# Patient Record
Sex: Female | Born: 1970 | Race: White | Hispanic: No | Marital: Married | Smoking: Never smoker
Health system: Southern US, Community
[De-identification: ages and names within clinical notes are randomized; demographics above are authoritative.]

## PROBLEM LIST (undated history)

## (undated) DIAGNOSIS — M7989 Other specified soft tissue disorders: Secondary | ICD-10-CM

## (undated) DIAGNOSIS — K760 Fatty (change of) liver, not elsewhere classified: Secondary | ICD-10-CM

## (undated) DIAGNOSIS — M329 Systemic lupus erythematosus, unspecified: Secondary | ICD-10-CM

## (undated) DIAGNOSIS — G90A Postural orthostatic tachycardia syndrome (POTS): Secondary | ICD-10-CM

## (undated) DIAGNOSIS — IMO0002 Reserved for concepts with insufficient information to code with codable children: Secondary | ICD-10-CM

## (undated) DIAGNOSIS — Z87442 Personal history of urinary calculi: Secondary | ICD-10-CM

## (undated) DIAGNOSIS — I498 Other specified cardiac arrhythmias: Secondary | ICD-10-CM

## (undated) DIAGNOSIS — D649 Anemia, unspecified: Secondary | ICD-10-CM

## (undated) DIAGNOSIS — M1990 Unspecified osteoarthritis, unspecified site: Secondary | ICD-10-CM

## (undated) DIAGNOSIS — F5002 Anorexia nervosa, binge eating/purging type: Secondary | ICD-10-CM

## (undated) DIAGNOSIS — J189 Pneumonia, unspecified organism: Secondary | ICD-10-CM

## (undated) DIAGNOSIS — M79631 Pain in right forearm: Secondary | ICD-10-CM

## (undated) DIAGNOSIS — K3184 Gastroparesis: Secondary | ICD-10-CM

## (undated) DIAGNOSIS — Z780 Asymptomatic menopausal state: Secondary | ICD-10-CM

## (undated) DIAGNOSIS — K219 Gastro-esophageal reflux disease without esophagitis: Secondary | ICD-10-CM

## (undated) DIAGNOSIS — T7840XA Allergy, unspecified, initial encounter: Secondary | ICD-10-CM

## (undated) DIAGNOSIS — N200 Calculus of kidney: Secondary | ICD-10-CM

## (undated) DIAGNOSIS — R7303 Prediabetes: Secondary | ICD-10-CM

## (undated) HISTORY — DX: Other specified soft tissue disorders: M79.89

## (undated) HISTORY — PX: CARPAL TUNNEL RELEASE: SHX101

## (undated) HISTORY — DX: Anorexia nervosa, binge eating/purging type: F50.02

## (undated) HISTORY — PX: CHOLECYSTECTOMY: SHX55

## (undated) HISTORY — DX: Asymptomatic menopausal state: Z78.0

## (undated) HISTORY — DX: Unspecified osteoarthritis, unspecified site: M19.90

## (undated) HISTORY — DX: Systemic lupus erythematosus, unspecified: M32.9

## (undated) HISTORY — DX: Allergy, unspecified, initial encounter: T78.40XA

## (undated) HISTORY — PX: ABDOMINAL HYSTERECTOMY: SHX81

## (undated) HISTORY — DX: Gastroparesis: K31.84

## (undated) HISTORY — DX: Anemia, unspecified: D64.9

## (undated) HISTORY — DX: Gastro-esophageal reflux disease without esophagitis: K21.9

## (undated) HISTORY — DX: Other specified soft tissue disorders: M79.631

## (undated) HISTORY — PX: KIDNEY STONE SURGERY: SHX686

---

## 2002-05-19 DIAGNOSIS — F50029 Anorexia nervosa, binge eating/purging type, unspecified: Secondary | ICD-10-CM

## 2002-05-19 DIAGNOSIS — F5002 Anorexia nervosa, binge eating/purging type: Secondary | ICD-10-CM

## 2002-05-19 HISTORY — DX: Anorexia nervosa, binge eating/purging type, unspecified: F50.029

## 2002-05-19 HISTORY — DX: Anorexia nervosa, binge eating/purging type: F50.02

## 2002-09-02 ENCOUNTER — Other Ambulatory Visit: Admission: RE | Admit: 2002-09-02 | Discharge: 2002-09-02 | Payer: Self-pay | Admitting: Obstetrics and Gynecology

## 2002-12-28 ENCOUNTER — Encounter: Admission: RE | Admit: 2002-12-28 | Discharge: 2003-03-28 | Payer: Self-pay

## 2003-10-23 ENCOUNTER — Other Ambulatory Visit: Admission: RE | Admit: 2003-10-23 | Discharge: 2003-10-23 | Payer: Self-pay | Admitting: Obstetrics and Gynecology

## 2005-02-18 ENCOUNTER — Other Ambulatory Visit: Admission: RE | Admit: 2005-02-18 | Discharge: 2005-02-18 | Payer: Self-pay | Admitting: Obstetrics and Gynecology

## 2005-05-19 HISTORY — PX: OVARIAN CYST SURGERY: SHX726

## 2005-08-27 ENCOUNTER — Ambulatory Visit (HOSPITAL_COMMUNITY): Admission: RE | Admit: 2005-08-27 | Discharge: 2005-08-27 | Payer: Self-pay | Admitting: Obstetrics and Gynecology

## 2005-11-17 ENCOUNTER — Encounter (INDEPENDENT_AMBULATORY_CARE_PROVIDER_SITE_OTHER): Payer: Self-pay | Admitting: Specialist

## 2005-11-18 ENCOUNTER — Inpatient Hospital Stay (HOSPITAL_COMMUNITY): Admission: RE | Admit: 2005-11-18 | Discharge: 2005-11-19 | Payer: Self-pay | Admitting: Ophthalmology

## 2007-09-08 ENCOUNTER — Encounter: Admission: RE | Admit: 2007-09-08 | Discharge: 2007-09-08 | Payer: Self-pay | Admitting: Gastroenterology

## 2007-09-22 ENCOUNTER — Encounter: Admission: RE | Admit: 2007-09-22 | Discharge: 2007-09-22 | Payer: Self-pay | Admitting: Gastroenterology

## 2008-04-24 ENCOUNTER — Encounter (INDEPENDENT_AMBULATORY_CARE_PROVIDER_SITE_OTHER): Payer: Self-pay | Admitting: Obstetrics and Gynecology

## 2008-04-24 ENCOUNTER — Ambulatory Visit (HOSPITAL_COMMUNITY): Admission: RE | Admit: 2008-04-24 | Discharge: 2008-04-25 | Payer: Self-pay | Admitting: Obstetrics and Gynecology

## 2008-08-28 ENCOUNTER — Encounter: Admission: RE | Admit: 2008-08-28 | Discharge: 2008-08-28 | Payer: Self-pay | Admitting: Obstetrics and Gynecology

## 2010-06-11 ENCOUNTER — Emergency Department (HOSPITAL_COMMUNITY)
Admission: EM | Admit: 2010-06-11 | Discharge: 2010-06-11 | Payer: Self-pay | Source: Home / Self Care | Admitting: Emergency Medicine

## 2010-06-12 LAB — URINE MICROSCOPIC-ADD ON

## 2010-06-12 LAB — DIFFERENTIAL
Basophils Absolute: 0 10*3/uL (ref 0.0–0.1)
Eosinophils Absolute: 0.1 10*3/uL (ref 0.0–0.7)
Eosinophils Relative: 1 % (ref 0–5)
Lymphocytes Relative: 23 % (ref 12–46)
Lymphs Abs: 1.6 10*3/uL (ref 0.7–4.0)
Neutrophils Relative %: 71 % (ref 43–77)

## 2010-06-12 LAB — URINALYSIS, ROUTINE W REFLEX MICROSCOPIC
Leukocytes, UA: NEGATIVE
Nitrite: NEGATIVE
Specific Gravity, Urine: 1.018 (ref 1.005–1.030)
Urine Glucose, Fasting: NEGATIVE mg/dL
pH: 7.5 (ref 5.0–8.0)

## 2010-06-12 LAB — BASIC METABOLIC PANEL
Chloride: 106 mEq/L (ref 96–112)
GFR calc non Af Amer: >60 mL/min (ref >60)
Potassium: 3.9 mEq/L (ref 3.5–5.1)
Sodium: 140 mEq/L (ref 135–145)

## 2010-06-12 LAB — CBC
HCT: 34.9 % — ABNORMAL LOW (ref 36.0–46.0)
Platelets: 255 10*3/uL (ref 150–400)
RBC: 4.28 MIL/uL (ref 3.87–5.11)
RDW: 12.3 % (ref 11.5–15.5)
WBC: 6.8 10*3/uL (ref 4.0–10.5)

## 2010-08-01 ENCOUNTER — Emergency Department (HOSPITAL_COMMUNITY): Payer: PRIVATE HEALTH INSURANCE

## 2010-08-01 ENCOUNTER — Emergency Department (HOSPITAL_COMMUNITY)
Admission: EM | Admit: 2010-08-01 | Discharge: 2010-08-01 | Disposition: A | Discharge status: Home / Self Care | Payer: PRIVATE HEALTH INSURANCE | Attending: Emergency Medicine | Admitting: Emergency Medicine

## 2010-08-01 DIAGNOSIS — R0602 Shortness of breath: Secondary | ICD-10-CM | POA: Insufficient documentation

## 2010-08-01 DIAGNOSIS — Z87442 Personal history of urinary calculi: Secondary | ICD-10-CM | POA: Insufficient documentation

## 2010-08-01 DIAGNOSIS — F410 Panic disorder [episodic paroxysmal anxiety] without agoraphobia: Secondary | ICD-10-CM | POA: Insufficient documentation

## 2010-08-01 DIAGNOSIS — R110 Nausea: Secondary | ICD-10-CM | POA: Insufficient documentation

## 2010-08-01 DIAGNOSIS — R079 Chest pain, unspecified: Secondary | ICD-10-CM | POA: Insufficient documentation

## 2010-08-01 DIAGNOSIS — M329 Systemic lupus erythematosus, unspecified: Secondary | ICD-10-CM | POA: Insufficient documentation

## 2010-08-01 LAB — DIFFERENTIAL
Basophils Relative: 0 % (ref 0–1)
Monocytes Absolute: 0.8 10*3/uL (ref 0.1–1.0)
Monocytes Relative: 9 % (ref 3–12)
Neutro Abs: 6.5 10*3/uL (ref 1.7–7.7)

## 2010-08-01 LAB — URINALYSIS, ROUTINE W REFLEX MICROSCOPIC
Bilirubin Urine: NEGATIVE
Hgb urine dipstick: NEGATIVE
Specific Gravity, Urine: 1.007 (ref 1.005–1.030)
Urobilinogen, UA: 0.2 mg/dL (ref 0.0–1.0)

## 2010-08-01 LAB — POCT I-STAT, CHEM 8
Calcium, Ion: 1.02 mmol/L — ABNORMAL LOW (ref 1.12–1.32)
Creatinine, Ser: 0.8 mg/dL (ref 0.4–1.2)
Glucose, Bld: 112 mg/dL — ABNORMAL HIGH (ref 70–99)
Hemoglobin: 12.9 g/dL (ref 12.0–15.0)
Potassium: 3.1 mEq/L — ABNORMAL LOW (ref 3.5–5.1)
TCO2: 18 mmol/L (ref 0–100)

## 2010-08-01 LAB — TROPONIN I: Troponin I: 0.01 ng/mL (ref 0.00–0.06)

## 2010-08-01 LAB — CBC
HCT: 36.8 % (ref 36.0–46.0)
Hemoglobin: 13 g/dL (ref 12.0–15.0)
MCH: 29.3 pg (ref 26.0–34.0)
MCHC: 35.3 g/dL (ref 30.0–36.0)

## 2010-08-01 LAB — D-DIMER, QUANTITATIVE: D-Dimer, Quant: 0.34 ug/mL-FEU (ref 0.00–0.48)

## 2010-08-05 ENCOUNTER — Emergency Department (HOSPITAL_COMMUNITY)
Admission: EM | Admit: 2010-08-05 | Discharge: 2010-08-05 | Disposition: A | Discharge status: Home / Self Care | Payer: PRIVATE HEALTH INSURANCE | Attending: Emergency Medicine | Admitting: Emergency Medicine

## 2010-08-05 ENCOUNTER — Emergency Department (HOSPITAL_COMMUNITY): Payer: PRIVATE HEALTH INSURANCE

## 2010-08-05 DIAGNOSIS — R209 Unspecified disturbances of skin sensation: Secondary | ICD-10-CM | POA: Insufficient documentation

## 2010-08-05 DIAGNOSIS — R059 Cough, unspecified: Secondary | ICD-10-CM | POA: Insufficient documentation

## 2010-08-05 DIAGNOSIS — R42 Dizziness and giddiness: Secondary | ICD-10-CM | POA: Insufficient documentation

## 2010-08-05 DIAGNOSIS — R109 Unspecified abdominal pain: Secondary | ICD-10-CM | POA: Insufficient documentation

## 2010-08-05 DIAGNOSIS — R079 Chest pain, unspecified: Secondary | ICD-10-CM | POA: Insufficient documentation

## 2010-08-05 DIAGNOSIS — Z79899 Other long term (current) drug therapy: Secondary | ICD-10-CM | POA: Insufficient documentation

## 2010-08-05 DIAGNOSIS — R05 Cough: Secondary | ICD-10-CM | POA: Insufficient documentation

## 2010-08-05 DIAGNOSIS — R4782 Fluency disorder in conditions classified elsewhere: Secondary | ICD-10-CM | POA: Insufficient documentation

## 2010-08-05 DIAGNOSIS — M6281 Muscle weakness (generalized): Secondary | ICD-10-CM | POA: Insufficient documentation

## 2010-08-05 DIAGNOSIS — M329 Systemic lupus erythematosus, unspecified: Secondary | ICD-10-CM | POA: Insufficient documentation

## 2010-08-05 DIAGNOSIS — R0602 Shortness of breath: Secondary | ICD-10-CM | POA: Insufficient documentation

## 2010-08-05 DIAGNOSIS — F411 Generalized anxiety disorder: Secondary | ICD-10-CM | POA: Insufficient documentation

## 2010-08-05 DIAGNOSIS — K8020 Calculus of gallbladder without cholecystitis without obstruction: Secondary | ICD-10-CM | POA: Insufficient documentation

## 2010-08-05 DIAGNOSIS — M549 Dorsalgia, unspecified: Secondary | ICD-10-CM | POA: Insufficient documentation

## 2010-08-05 LAB — COMPREHENSIVE METABOLIC PANEL
Alkaline Phosphatase: 135 U/L — ABNORMAL HIGH (ref 39–117)
BUN: 10 mg/dL (ref 6–23)
Chloride: 109 mEq/L (ref 96–112)
Glucose, Bld: 87 mg/dL (ref 70–99)
Potassium: 4 mEq/L (ref 3.5–5.1)
Total Bilirubin: 0.3 mg/dL (ref 0.3–1.2)

## 2010-08-05 LAB — CBC
HCT: 36.3 % (ref 36.0–46.0)
MCV: 81 fL (ref 78.0–100.0)
RBC: 4.48 MIL/uL (ref 3.87–5.11)
WBC: 8.7 10*3/uL (ref 4.0–10.5)

## 2010-08-05 LAB — LIPASE, BLOOD: Lipase: 29 U/L (ref 11–59)

## 2010-08-05 LAB — DIFFERENTIAL
Lymphocytes Relative: 26 % (ref 12–46)
Lymphs Abs: 2.2 10*3/uL (ref 0.7–4.0)
Neutrophils Relative %: 67 % (ref 43–77)

## 2010-10-01 NOTE — H&P (Signed)
NAMECAROLYNN, Debbie [name]            ACCOUNT NO.:  [id number]   MEDICAL RECORD NO.:  [id number]          PATIENT TYPE:  AMB   LOCATION:  SDC                           FACILITY:  [facility]   PHYSICIAN:  Duke Salvia. Marcelle Overlie, M.D.DATE OF BIRTH:  06-24-1970   DATE OF ADMISSION:  DATE OF DISCHARGE:                              HISTORY & PHYSICAL   CHIEF COMPLAINT:  Dyspareunia, abnormal uterine bleeding.   HISTORY OF PRESENT ILLNESS:  A 40 year old, G2, P2.  This patient  underwent first laparoscopy in 2007 for pelvic pain and dyspareunia.  Findings were pretty normal at that time, but due to continued problems,  she underwent [facility], December 05, 2005.  Since that time, she has had some  minimal monthly spotting, but this has been in aggravation for her.  In  addition, she is still having deep dyspareunia from point tenderness  from cervical manipulation and presents now for removal of the cervical  stump.  This procedure including risks related to bleeding, infection,  the possible need for open additional surgery, adjacent organ injury,  along with her expected recovery time, all reviewed with her, which she  understands and accepts.   PAST MEDICAL HISTORY:   ALLERGIES:  Seasonal allergies only.   SURGICAL HISTORY:  She has had diagnostic laparoscopy, LSH in 2007.  She  is a G2, P2.   CURRENT MEDICATIONS:  GlycoLax.   PHYSICAL EXAMINATION:  VITAL SIGNS:  Temperature 98.2 and blood pressure  100/70.  HEENT: Unremarkable.  NECK:  Supple.  Without masses.  LUNGS:  Clear.  CARDIOVASCULAR:  Regular rate and rhythm without murmurs, rubs, or  gallops.  BREASTS:  Without masses.  ABDOMEN:  Soft and nontender.  PELVIC:  Normal external genitalia.  Vagina and cervix clear.  Uterine  fundus is surgically absent.  Adnexa negative.  EXTREMITIES:  Unremarkable.  NEUROLOGIC:  Unremarkable.   IMPRESSION:  Dyspareunia, continued bleeding, status post laparoscopic  supracervical hysterectomy.   PLAN:   Diagnostic laparoscopy with removal of cervical stump.  Procedure  and risks reviewed as above.      Richard M. Marcelle Overlie, M.D.  Electronically Signed     RMH/MEDQ  D:  04/19/2008  T:  04/20/2008  Job:  [id number]

## 2010-10-01 NOTE — Op Note (Signed)
Debbie [name], Debbie [name]            ACCOUNT NO.:  [id number]   MEDICAL RECORD NO.:  [id number]          PATIENT TYPE:  OIB   LOCATION:  9309                          [facility]ILITY:  [facility]   PHYSICIAN:  Duke Salvia. Marcelle Overlie, M.D.DATE OF BIRTH:  Jan 15, 1971   DATE OF PROCEDURE:  04/24/2008  DATE OF DISCHARGE:                               OPERATIVE REPORT   PREOPERATIVE DIAGNOSES:  1. Abnormal uterine bleeding.  2. Dyspareunia.   POSTOPERATIVE DIAGNOSES:  1. Abnormal uterine bleeding.  2. Dyspareunia.   PROCEDURE:  Diagnostic laparoscopy, removal of cervical stump.   SURGEON:  Duke Salvia. Marcelle Overlie, MD   ANESTHESIA:  General endotracheal.   COMPLICATIONS:  None.   DRAINS:  Foley catheter.   BLOOD LOSS:  50-100 mL.   SPECIMENS REMOVED:  Cervical stump.   PROCEDURE AND FINDINGS:  The patient was taken to the operative room  after an adequate level of general endotracheal anesthesia was obtained  with the patient's legs in stirrups.  The abdomen, perineum, and vagina  were prepped and draped in usual manner for laparoscopy.  The bladder  was drained.  Subumbilical area was infiltrated with 0.25% Marcaine  plain.  A small incision was made and the Veress needle was introduced  without difficulty.  It was intra-abdominal position verified by  pressure and water testing.  After 2-1/2 L pneumoperitoneum was then  created, laparoscopic trocar and sleeve were then introduced without  difficulty.  There was no evidence of any bleeding or trauma.  She was  placed in slight Trendelenburg and a 5-mm trocar was placed, 3  fingerbreadths above the symphysis in the midline under direct  visualization.  With the patient in Trendelenburg, the pelvis was  inspected, bilateral tubes and ovaries were normal.  There were no  unusual adhesions into the area of the cervical stump.  The cul-de-sac  area looked free and clear.  After this was discovered, the left vaginal  portion of the procedure started, the  legs were extended, a weighted  speculum was positioned, cervix grasped with tenaculum, cervical vaginal  mucosa was incised, posterior colpotomy performed without difficulty,  and bladder was advanced superiorly with sharp and blunt dissection.  Uterosacral ligaments were clamped, divided, and suture-ligated with 0  Vicryl suture.  In sequential manner site, closed the uterus and  assuring that the bladder was well above the cardinal ligament, it was  clamped, divided, and suture ligated.  The surgeon's finger was then  used to positioned posteriorly to identify the anterior peritoneal  reflection, which was dissected free and the bladder was again advanced  superiorly.  The remaining pedicle was clamped, divided, suture ligated,  and the cervical stump specimen was removed.  Prior to closure, all  sponge, needle, and instruments counts were reported as correct x2.  The  vagina closed from right-to-left with interrupted 2-0 Monocryl sutures.  This area was hemostatic.  Foley catheter positioned and draining clear  urine.  Repeat laparoscopy then revealed that the operative site was  hemostatic.  There were no adhesions into the vaginal cuff area.  After  this was noted, instruments were removed and  gas allowed to escape.  Defects closed with 4 Dexon subcuticular sutures and Dermabond.  She  tolerated this well and went to recovery room in good condition.      Richard M. Marcelle Overlie, M.D.  Electronically Signed     RMH/MEDQ  D:  04/24/2008  T:  04/24/2008  Job:  [id number]

## 2010-10-01 NOTE — Discharge Summary (Signed)
Debbie [name], Debbie [name]            ACCOUNT NO.:  [id number]   MEDICAL RECORD NO.:  [id number]          PATIENT TYPE:  OIB   LOCATION:  9309                          [facility]ACILITY:  [facility]   PHYSICIAN:  Duke Salvia. Marcelle Overlie, M.D.DATE OF BIRTH:  04-13-1971   DATE OF ADMISSION:  04/24/2008  DATE OF DISCHARGE:  04/25/2008                               DISCHARGE SUMMARY   DISCHARGE DIAGNOSES:  1. Dyspareunia.  2. Abnormal uterine bleeding.   PROCEDURE:  Laparoscopy with removal of cervical stump this admission.   SUMMARY OF HISTORY AND PHYSICAL EXAM:  Please see admission H&P for  details.  Briefly, a 40 year old G2 P2, who underwent LSH in July 2007,  did well initially, but continues to have some monthly light bleeding,  also associated with dyspareunia.  This has not improved and she  presents at this time for removal of the cervical stump.   HOSPITAL COURSE:  On April 24, 2008, under general anesthesia, the  patient underwent laparoscopy followed by a vaginal removal of the  cervical stump with minimal EBL.  On the first postop day, her catheter  was removed.  She was tolerating a regular diet.  She was afebrile.  Hemoglobin preop 12.1.  Postop on April 25, 2008 was 10.1.  WBC was  8.3 postop.  She was ambulating without difficulty and was ready for  discharge on postop day #1.   DISPOSITION:  Discharged on Vicodin 1-2 p.o. q.4-6 h. p.r.n. pain,  Motrin 800 mg 1 p.o. q.8-12 h. p.r.n. pain.  She will return to our  office in a week to 10 days.  Advised to report any increased vaginal  bleeding, fever over 101, incisional redness or drainage.  She was given  specific instructions regarding diet/exercise.   CONDITION:  Good.   ACTIVITY:  Good.      Richard M. Marcelle Overlie, M.D.  Electronically Signed     RMH/MEDQ  D:  04/25/2008  T:  04/25/2008  Job:  [id number]

## 2010-10-04 NOTE — H&P (Signed)
Debbie [name], Debbie [name]            ACCOUNT NO.:  [id number]   MEDICAL RECORD NO.:  [id number]          PATIENT TYPE:  AMB   LOCATION:  SDC                           [facility]   PHYSICIAN:  Duke Salvia. Marcelle Overlie, M.D.DATE OF BIRTH:  [birth year]   DATE OF ADMISSION:  DATE OF DISCHARGE:                                HISTORY & PHYSICAL   CHIEF COMPLAINT:  Pelvic pain.   HISTORY OF PRESENT ILLNESS:  A 40 year old G2, P2 currently using Ortho-  Evra, had worsening lower back and pelvic pain and dyspareunia and presents  now for definitive laparoscopy.  This procedure, including risks of  bleeding, infection, adjacent organ injury, the possible need for open or  additional surgery were all reviewed with her.  She understands and accepts.   ALLERGIES:  NO KNOWN DRUG ALLERGIES.   CURRENT MEDICATIONS:  Ortho-Evra and pain medicine p.r.n.   PAST SURGICAL HISTORY:  Prior laparoscopy at [facility] for an  ovarian cyst.   OBSTETRICAL HISTORY:  Two vaginal deliveries at term without complications.   REVIEW OF SYSTEMS:  Significant for IBS symptoms and history of migraine  headache.   FAMILY HISTORY:  Significant for diabetes, cancer, high blood pressure and  thyroid disease.   PHYSICAL EXAMINATION:  VITAL SIGNS:  Temperature 98.2, blood pressure  120/70.  HEENT:  Unremarkable.  NECK:  Supple without masses.  LUNGS:  Clear.  CARDIOVASCULAR:  Regular rate and rhythm without murmurs, rubs, or gallops.  BREASTS:  Without masses.  ABDOMEN:  Soft, flat and nontender.  PELVIC:  Normal external genitalia. Vagina and cervix are normal Uterus  normal position and size.  Adnexa negative.  No unusual nodularity or  tenderness.  EXTREMITIES:  Unremarkable.  NEUROLOGIC:  Unremarkable.   IMPRESSION:  Chronic pelvic pain, dyspareunia.   PLAN:  Diagnostic laparoscopy.  Procedure and risks reviewed as above.      Richard M. Marcelle Overlie, M.D.  Electronically Signed     RMH/MEDQ  D:   08/25/2005  T:  08/25/2005  Job:  [id number]

## 2010-10-04 NOTE — H&P (Signed)
Debbie [name], Debbie [name]            ACCOUNT NO.:  [id number]   MEDICAL RECORD NO.:  [id number]          PATIENT TYPE:  AMB   LOCATION:  SDC                           [facility]   PHYSICIAN:  Duke Salvia. Marcelle Overlie, M.D.DATE OF BIRTH:  [birth year]   DATE OF ADMISSION:  11/17/2005  DATE OF DISCHARGE:                                HISTORY & PHYSICAL   CHIEF COMPLAINT:  Pelvic pain.   HISTORY OF PRESENT ILLNESS:  A 40 year old G5, P2 who has had a one-year  history of pelvic pain and increased pressure.  Part of her evaluation  recently she underwent diagnostic laparoscopic that showed normal findings,  that was dated August 27, 2005, except for some increased vessels suspicious  for possible pelvic congestion syndrome.  She has tried hormonal suppression  with Ortho-Evra.  Additionally, she has had GI evaluation by Dr. Randa Evens  with recent colonoscopy, all of which was normal.  Most of her symptoms  clustered around the time of her with increased pain and pelvic pressure at  that time.  She presents now for [facility].  She understands that with [facility] there  is a small chance of monthly spotting.  Other risks relative to bleeding,  infection, transfusion, the possible need to complete the surgery by open  technique, adjacent organ injury, the need for yearly cytology all reviewed  with her which she understands and accepts.  Her preference was for a  minimally invasive procedure.   PAST MEDICAL HISTORY:   CURRENT MEDICATIONS:  Wellbutrin.   OTHER SURGERIES:  She has had laparoscopy for a prior ovarian cyst.   OBSTETRICAL HISTORY:  She has had two vaginal deliveries at term.   FAMILY HISTORY:  Otherwise noncontributory.   PHYSICAL EXAMINATION:  VITAL SIGNS:  Temperature 98.2, blood pressure  120/62.  HEENT:  Unremarkable.  NECK:  Supple without masses.  LUNGS:  Clear.  CARDIOVASCULAR:  Regular rate and rhythm without murmurs, rubs, or gallops  noted.  BREASTS:  Without masses.  ABDOMEN:   Soft, flat, nontender.  PELVIC:  Normal external genitalia.  Vagina, cervix clear.  Uterus mid  position, normal size.  Adnexa negative, mobile.  EXTREMITIES:  Unremarkable.  NEUROLOGIC:  Unremarkable.   IMPRESSION:  Chronic perimenstrual pain unresponsive to conservative  measures.   PLAN:  LSH.  Procedure and risks reviewed as above.      Richard M. Marcelle Overlie, M.D.  Electronically Signed     RMH/MEDQ  D:  11/14/2005  T:  11/14/2005  Job:  [id number]

## 2010-10-04 NOTE — Op Note (Signed)
Debbie [name], NOLDEN            ACCOUNT NO.:  [id number]   MEDICAL RECORD NO.:  [id number]          PATIENT TYPE:  AMB   LOCATION:  SDC                           [facility]   PHYSICIAN:  Duke Salvia. Marcelle Overlie, M.D.DATE OF BIRTH:  February 10, 1971   DATE OF PROCEDURE:  08/27/2005  DATE OF DISCHARGE:                                 OPERATIVE REPORT   PREOP DIAGNOSIS:  Pelvic pain, dyspareunia.   POSTOP DIAGNOSIS:  Pelvic pain, dyspareunia.   PROCEDURE:  Diagnostic laparoscopy.   SURGEON:  Duke Salvia. Marcelle Overlie, M.D.   ANESTHESIA:  General endotracheal.   SPECIMENS:  None.   COMPLICATIONS:  None.   ESTIMATED BLOOD LOSS:  Minimal.   PROCEDURE AND FINDINGS:  The patient was taken to the operating room and  after an adequate level of general endotracheal anesthesia was obtained with  the patient with legs in stirrups, the abdomen, perineum and vagina prepped  and draped usual manner for laparoscopy.  The bladder was drained, EUA  carried out.  Uterus anterior, normal size.  Adnexa negative.  A Hulka  tenaculum positioned.  In-and-out Foley catheter to empty the bladder.  Marcaine used in the subumbilical area, small incision was made.  The Veress  needle was introduced without difficulty.  Its intra-abdominal position was  verified by pressure and water testing.  A 2.5 L pneumoperitoneum was then  created.  Laparoscopic trocar and sleeve were then introduced without  difficulty.  Three fingerbreadths above the symphysis in midline, 5 mm  trocar was inserted under direct visualization.  The patient in  Trendelenburg and uterus anteflexed, pelvic findings as follows.   Anterior posterior cul-de-sac spaces were unremarkable.  Both uterosacral  ligaments were well defined.  Uterus itself was normal as were bilateral  tubes and ovaries.  The sidewall course of the ureter completely normal.  No  evidence of adhesive disease or early stage endometriosis.  There were some  minimally  prominent vessels consistent with mild pelvic congestion.  The  appendix, cecum, liver, gallbladder, upper abdominal findings unremarkable.  After these findings were noted and video documented, instruments removed,  gas allowed to escape.  Defect was closed with 4-0 Dexon subcuticular  sutures and Dermabond.  She tolerated this well went to recovery room in  good condition.      Richard M. Marcelle Overlie, M.D.  Electronically Signed     RMH/MEDQ  D:  08/27/2005  T:  08/27/2005  Job:  [id number]

## 2010-10-04 NOTE — Discharge Summary (Signed)
NAMETRUST, CRAGO            ACCOUNT NO.:  [id number]   MEDICAL RECORD NO.:  [id number]          PATIENT TYPE:  INP   LOCATION:  9309                          [facility]   PHYSICIAN:  Duke Salvia. Marcelle Overlie, M.D.DATE OF BIRTH:  1970-08-04   DATE OF ADMISSION:  11/17/2005  DATE OF DISCHARGE:  11/19/2005                                 DISCHARGE SUMMARY   DISCHARGE DIAGNOSES:  1.  Chronic pelvic pain.  2.  LSH this admission.   SUMMARY OF HISTORY AND PHYSICAL EXAMINATION:  Please see admission H&P for  details.  Briefly, a 40 year old with chronic pelvic pain who presents for  an [facility].  She has failed conservative treatment as noted in her admission  history and physical details.   HOSPITAL COURSE:  On July 2 under general anesthesia, the patient underwent  LSA with an EBL of 150 mL.  Her catheter was removed the evening of surgery  and she was voiding without difficulty.  The following a.m. however,  although she was afebrile and her exam was within normal limits, she was  still complaining of significant pain and felt she was not ready for  discharge at that point.  At that point hemoglobin was 8.9, WBC 7900.  She  was afebrile.  Her diet was advanced that day.  She was admitted for one  additional night.  On the a.m. of July 4 was afebrile, hemoglobin 10.6.  her  abdominal exam was unremarkable.  She was ready for discharge at that point.   CBC on admission:  WBC 6.0, hemoglobin 12.1.  UPT was negative.  Blood type  is O+, antibody screen was negative.  On the first postop day, hemoglobin  8.9.  CBC and November 19, 2005:  WBC 7100, hemoglobin 10.6.   DISPOSITION:  The patient discharged on Hemocyte once daily, Tylox p.r.n.  pain.  Will return the office in 2 days for follow-up visit.  Advised to  report any incisional redness or drainage, increased pain or bleeding, or  fever over 101, was given specific instructions regarding diet, sex, and  exercise.   CONDITION:  Good.   ACTIVITY:  Graded increase.      Richard M. Marcelle Overlie, M.D.  Electronically Signed     RMH/MEDQ  D:  11/19/2005  T:  11/19/2005  Job:  [id number]

## 2010-10-04 NOTE — H&P (Signed)
Debbie [name], Debbie [name]            ACCOUNT NO.:  [id number]   MEDICAL RECORD NO.:  [id number]          PATIENT TYPE:  AMB   LOCATION:  SDC                           [facility]   PHYSICIAN:  Duke Salvia. Marcelle Overlie, M.D.DATE OF BIRTH:  05-Feb-1971   DATE OF ADMISSION:  DATE OF DISCHARGE:                                HISTORY & PHYSICAL   CHIEF COMPLAINT:  Pelvic pain, dyspareunia.   HISTORY OF PRESENT ILLNESS:  A 40 year old G5, P2, currently on Loestrin and  also using condoms, was seen earlier this year, complaining of continued  problems with abnormal bleeding and pelvic pain, along with dyspareunia.  We  had discussed hysterectomy at that point, which she was strongly  considering.  Due to continued problems with pain and bleeding, she presents  now for more conservative approach with laparoscopy first.  This procedure,  including risks of bleeding, infection, other complications that may require  open or additional surgery, all reviewed with her, which she understands and  accepts.   ALLERGIES:  None.   CURRENT MEDICATIONS:  1.  Loestrin.  2.  Protonix.  3.  Prozac.   Dictator asks to cancel this dictation.      Richard M. Marcelle Overlie, M.D.  Electronically Signed     RMH/MEDQ  D:  08/11/2005  T:  08/11/2005  Job:  [id number]

## 2010-10-04 NOTE — Op Note (Signed)
Debbie [name], BRIGANDI            ACCOUNT NO.:  [id number]   MEDICAL RECORD NO.:  [id number]          PATIENT TYPE:  AMB   LOCATION:  SDC                           [facility]   PHYSICIAN:  Duke Salvia. Marcelle Overlie, M.D.DATE OF BIRTH:  Nov 12, 1970   DATE OF PROCEDURE:  11/17/2005  DATE OF DISCHARGE:                                 OPERATIVE REPORT   PREOPERATIVE DIAGNOSIS:  Chronic pelvic pain.   POSTOPERATIVE DIAGNOSIS:  Chronic pelvic pain.   PROCEDURE:  LFA.   SURGEON:  Duke Salvia. Marcelle Overlie, M.D.   ASSISTANT:  Guy Sandifer. Henderson Cloud, M.D.   ANESTHESIA:  General endotracheal.   COMPLICATIONS:  None.   DRAINS:  Foley catheter.   BLOOD LOSS:  150 mL.   PROCEDURE AND FINDINGS:  The patient was taken to the operating room.  After  an adequate level of general endotracheal anesthesia was obtained, the  patient legs stirrups, the abdomen, perineum and vagina prepped and draped  usual manner for laparoscopy.  The bladder was drained, EUA carried out.  Uterus midposition, normal size, adnexa negative.  The subumbilical area was  infiltrated with half percent Marcaine plain.  Small incision was made.  Veress needle was introduced without difficulty, its intra-abdominal  position verified by pressure and water testing.  After a 2.5 liter  pneumoperitoneum created, a laparoscopic trocar and sleeve was then  introduced without difficulty.  There is no evidence of any bleeding or  trauma and the right and left lower quadrant 10 mm bladeless trocar was  inserted after negative transillumination.  The patient placed in  Trendelenburg and the assistant used to grasp the uterine fundus which was  upper limits normal size.  Bilateral adnexa were unremarkable.  Starting on  the left the Harmonic Ace was used to coagulate and divide the utero-ovarian  pedicle down to and including the round ligament with excellent hemostasis.  This was repeated on the opposite side.  The peritoneum was then carried  around to the midline anteriorly and the bladder was minimally dissected  with blunt dissection.  The uterine artery vasculature was then skeletonized  on each side with Harmonic Ace.  The ascending branch of the uterine was  then coagulated and cut on each side with excellent hemostasis.  The  Harmonic was then used to transect the cervix in a minimal reverse cone  fashion.  Once this was accomplished, the Harmonic Ace on low power was used  to coagulate the endocervical canal.  The morcellator was then introduced to  left lower quadrant and the uterine fundus was morcellated, the pelvis was  irrigated with saline.  Pressure was reduced.  The operative site was noted  to be hemostatic.  All uterine fragments had been removed.  After further  irrigation and aspiration, the operative site was observed and noted be  hemostatic.  Interceed was placed across the cervical stump.  All  instruments were removed.  Gas allowed to escape.  Deep layers closed with 4-0 Dexon subcuticular sutures and Dermabond.  The  left lower quadrant incision fascia was also closed individually with 2-0  Vicryl suture.  Clear  urine noted at the end of the case.  She tolerated  this well, went to recovery room in good condition.      Richard M. Marcelle Overlie, M.D.  Electronically Signed     RMH/MEDQ  D:  11/17/2005  T:  11/17/2005  Job:  [id number]

## 2011-02-21 LAB — CBC
HCT: 29 % — ABNORMAL LOW (ref 36.0–46.0)
HCT: 34.4 % — ABNORMAL LOW (ref 36.0–46.0)
Hemoglobin: 12.1 g/dL (ref 12.0–15.0)
MCHC: 35 g/dL (ref 30.0–36.0)
MCHC: 35.1 g/dL (ref 30.0–36.0)
MCV: 88 fL (ref 78.0–100.0)
RBC: 3.29 MIL/uL — ABNORMAL LOW (ref 3.87–5.11)
RBC: 3.96 MIL/uL (ref 3.87–5.11)
RDW: 12.8 % (ref 11.5–15.5)
WBC: 8.3 10*3/uL (ref 4.0–10.5)

## 2011-07-25 ENCOUNTER — Other Ambulatory Visit (HOSPITAL_COMMUNITY): Payer: Self-pay | Admitting: Gastroenterology

## 2011-07-25 DIAGNOSIS — R112 Nausea with vomiting, unspecified: Secondary | ICD-10-CM

## 2011-08-18 ENCOUNTER — Emergency Department (HOSPITAL_COMMUNITY): Payer: BC Managed Care – PPO

## 2011-08-18 ENCOUNTER — Encounter (HOSPITAL_COMMUNITY): Payer: Self-pay | Admitting: Emergency Medicine

## 2011-08-18 ENCOUNTER — Emergency Department (HOSPITAL_COMMUNITY)
Admission: EM | Admit: 2011-08-18 | Discharge: 2011-08-19 | Disposition: A | Discharge status: Home / Self Care | Payer: BC Managed Care – PPO | Attending: Emergency Medicine | Admitting: Emergency Medicine

## 2011-08-18 DIAGNOSIS — Z79899 Other long term (current) drug therapy: Secondary | ICD-10-CM | POA: Insufficient documentation

## 2011-08-18 DIAGNOSIS — M329 Systemic lupus erythematosus, unspecified: Secondary | ICD-10-CM | POA: Insufficient documentation

## 2011-08-18 DIAGNOSIS — K5900 Constipation, unspecified: Secondary | ICD-10-CM | POA: Insufficient documentation

## 2011-08-18 DIAGNOSIS — R112 Nausea with vomiting, unspecified: Secondary | ICD-10-CM | POA: Insufficient documentation

## 2011-08-18 DIAGNOSIS — R109 Unspecified abdominal pain: Secondary | ICD-10-CM | POA: Insufficient documentation

## 2011-08-18 HISTORY — DX: Systemic lupus erythematosus, unspecified: M32.9

## 2011-08-18 HISTORY — DX: Calculus of kidney: N20.0

## 2011-08-18 HISTORY — DX: Reserved for concepts with insufficient information to code with codable children: IMO0002

## 2011-08-18 LAB — BASIC METABOLIC PANEL
BUN: 14 mg/dL (ref 6–23)
Calcium: 10.4 mg/dL (ref 8.4–10.5)
Chloride: 98 mEq/L (ref 96–112)
Creatinine, Ser: 0.54 mg/dL (ref 0.50–1.10)
GFR calc Af Amer: >90 mL/min (ref >90)
GFR calc non Af Amer: >90 mL/min (ref >90)

## 2011-08-18 LAB — CBC
HCT: 39.9 % (ref 36.0–46.0)
Hemoglobin: 14.1 g/dL (ref 12.0–15.0)
MCH: 29.4 pg (ref 26.0–34.0)
MCHC: 35.3 g/dL (ref 30.0–36.0)
MCV: 83.1 fL (ref 78.0–100.0)
RDW: 12.5 % (ref 11.5–15.5)

## 2011-08-18 LAB — DIFFERENTIAL
Basophils Absolute: 0 10*3/uL (ref 0.0–0.1)
Basophils Relative: 0 % (ref 0–1)
Eosinophils Absolute: 0.1 10*3/uL (ref 0.0–0.7)
Eosinophils Relative: 1 % (ref 0–5)
Monocytes Absolute: 0.5 10*3/uL (ref 0.1–1.0)
Monocytes Relative: 4 % (ref 3–12)

## 2011-08-18 LAB — URINALYSIS, ROUTINE W REFLEX MICROSCOPIC
Glucose, UA: NEGATIVE mg/dL
Ketones, ur: NEGATIVE mg/dL
Protein, ur: NEGATIVE mg/dL
pH: 8 (ref 5.0–8.0)

## 2011-08-18 MED ORDER — ONDANSETRON HCL 4 MG/2ML IJ SOLN
4.0000 mg | Freq: Once | INTRAMUSCULAR | Status: AC
  Administered 2011-08-18: 4 mg via INTRAVENOUS

## 2011-08-18 MED ORDER — MAGNESIUM CITRATE PO SOLN
1.0000 | Freq: Once | ORAL | Status: DC
  Filled 2011-08-18: qty 296

## 2011-08-18 MED ORDER — KETOROLAC TROMETHAMINE 30 MG/ML IJ SOLN
30.0000 mg | Freq: Once | INTRAMUSCULAR | Status: AC
  Administered 2011-08-18: 30 mg via INTRAVENOUS
  Filled 2011-08-18: qty 1

## 2011-08-18 MED ORDER — FENTANYL CITRATE 0.05 MG/ML IJ SOLN: 50.0000 ug | Freq: Once | INTRAMUSCULAR | Status: DC

## 2011-08-18 MED ORDER — SODIUM CHLORIDE 0.9 % IV BOLUS (SEPSIS)
1000.0000 mL | Freq: Once | INTRAVENOUS | Status: AC
  Administered 2011-08-18: 1000 mL via INTRAVENOUS

## 2011-08-18 MED ORDER — HYDROMORPHONE HCL PF 1 MG/ML IJ SOLN
1.0000 mg | Freq: Once | INTRAMUSCULAR | Status: AC
  Administered 2011-08-18: 1 mg via INTRAVENOUS
  Filled 2011-08-18: qty 1

## 2011-08-18 MED ORDER — GLYCERIN (LAXATIVE) 2.1 G RE SUPP
1.0000 | Freq: Once | RECTAL | Status: DC
  Filled 2011-08-18: qty 1

## 2011-08-18 MED ORDER — ONDANSETRON HCL 4 MG/2ML IJ SOLN
INTRAMUSCULAR | Status: AC
  Filled 2011-08-18: qty 2

## 2011-08-18 NOTE — ED Provider Notes (Signed)
History     CSN: [id number]  Arrival date & time 08/18/11  Paulo Fruit   First MD Initiated Contact with Patient 08/18/11 1947      Chief Complaint  Patient presents with  . Abdominal Pain    (Consider location/radiation/quality/duration/timing/severity/associated sxs/prior treatment) HPI Ms. Debbie [name] is a 41 yo female with a history of kidney stones and lupus, who presents to the ED today for evaluation of abdominal pain.  Pt states the pain started this afternoon around 1500.  The pain has been increasing in intensity since then.  She describes the pain as sharp and intense, diffusely in her lower abdomen.  She feels this is similar to her previous pain with her kidney stones in the past.  She has some N/V, but denies diarrhea, vaginal discharge, urinary frequency, urgency, SOB, chest pain, fevers, chills. Past Medical History  Diagnosis Date  . Kidney stones   . Lupus     History reviewed. No pertinent past surgical history.  No family history on file.  History  Substance Use Topics  . Smoking status: Never Smoker   . Smokeless tobacco: Not on file  . Alcohol Use: No    OB History    Grav Para Term Preterm Abortions TAB SAB Ect Mult Living                  Review of Systems All pertinent positives and negatives reviewed in the history of present illness  Allergies  Review of patient's allergies indicates no known allergies.  Home Medications   Current Outpatient Rx  Name Route Sig Dispense Refill  . ESTRADIOL 1 MG PO TABS Oral Take 1 mg by mouth daily.    Marland Kitchen HYDROXYCHLOROQUINE SULFATE 200 MG PO TABS Oral Take 200 mg by mouth daily.    Marland Kitchen POLYETHYLENE GLYCOL 3350 PO PACK Oral Take 17 g by mouth daily.      BP 137/101  Pulse 92  Temp(Src) 97.8 F (36.6 C) (Oral)  Resp 20  SpO2 98%  Physical Exam  Constitutional: She is oriented to person, place, and time. She appears well-developed and well-nourished. She appears distressed.  Cardiovascular: Normal rate, regular  rhythm, normal heart sounds and intact distal pulses.   No murmur heard. Pulmonary/Chest: Effort normal and breath sounds normal. No respiratory distress. She has no wheezes.  Abdominal: Soft. Bowel sounds are normal. She exhibits no distension. There is tenderness (RLQ and LLQ tenderness).  Neurological: She is alert and oriented to person, place, and time.  Skin: Skin is warm. No rash noted. She is not diaphoretic. No erythema.    ED Course  Procedures (including critical care time)     Pt seen and assessed.  Will order antiemetic and analgesics, labs, and imaging. 10:31 PM Pt has had 2 BM's since being here.  She has felt there to be some constipation over the past 2 days and has had pain with defecation.  States there is still pain with defecation, but after the first BM she felt some relief.  Wants to try some PO fluids.  The patient has been stable here in the ER. The patient is advised to follow up with her PCP. Told to return here as needed. Told to increase her fluid intake. MDM  MDM Reviewed: nursing note and vitals Interpretation: labs and CT scan            Carlyle Dolly, PA-C 08/19/11 0103

## 2011-08-18 NOTE — ED Notes (Signed)
PT. REPORTS LOW ABDOMINAL PAIN WITH VOMITTING AND DYSURIA ONSET THIS AFTERNOON , DENIES DIARRHEA , NO FEVER OR CHILLS.

## 2011-08-19 LAB — URINE MICROSCOPIC-ADD ON

## 2011-08-19 NOTE — Discharge Instructions (Signed)
Increase your fluid intake. Return here as needed. Take over the counter stool softener. Avoid narcotic pain medications as this can make things worse. Your CT scan show a large amount of constipation.  Pt to be sent home with 1 Glycerin suppository and 1 Bottle of magnesium citrate solution.

## 2011-08-20 ENCOUNTER — Encounter (HOSPITAL_COMMUNITY)
Admission: RE | Admit: 2011-08-20 | Discharge: 2011-08-20 | Disposition: A | Discharge status: Home / Self Care | Payer: BC Managed Care – PPO | Source: Ambulatory Visit | Attending: Gastroenterology | Admitting: Gastroenterology

## 2011-08-20 DIAGNOSIS — R112 Nausea with vomiting, unspecified: Secondary | ICD-10-CM | POA: Insufficient documentation

## 2011-08-20 MED ORDER — TECHNETIUM TC 99M SULFUR COLLOID
2.0000 | Freq: Once | INTRAVENOUS | Status: AC | PRN
  Administered 2011-08-20: 2 via INTRAVENOUS

## 2011-08-20 NOTE — ED Provider Notes (Signed)
Medical screening examination/treatment/procedure(s) were performed by non-physician practitioner and as supervising physician I was immediately available for consultation/collaboration.   Shelda Jakes, MD 08/20/11 [phone]

## 2012-05-06 ENCOUNTER — Encounter (HOSPITAL_BASED_OUTPATIENT_CLINIC_OR_DEPARTMENT_OTHER): Payer: Self-pay

## 2012-05-06 ENCOUNTER — Ambulatory Visit (HOSPITAL_BASED_OUTPATIENT_CLINIC_OR_DEPARTMENT_OTHER): Admit: 2012-05-06 | Payer: Self-pay | Admitting: Orthopedic Surgery

## 2012-05-06 SURGERY — CARPAL TUNNEL RELEASE
Anesthesia: General | Site: Wrist | Laterality: Right

## 2012-10-03 ENCOUNTER — Ambulatory Visit (INDEPENDENT_AMBULATORY_CARE_PROVIDER_SITE_OTHER): Payer: BC Managed Care – PPO | Admitting: Family Medicine

## 2012-10-03 VITALS — BP 126/84 | HR 78 | Temp 98.9°F | Resp 18 | Ht 62.0 in | Wt 142.0 lb

## 2012-10-03 DIAGNOSIS — W57XXXA Bitten or stung by nonvenomous insect and other nonvenomous arthropods, initial encounter: Secondary | ICD-10-CM

## 2012-10-03 DIAGNOSIS — S70262A Insect bite (nonvenomous), left hip, initial encounter: Secondary | ICD-10-CM

## 2012-10-03 MED ORDER — DOXYCYCLINE HYCLATE 100 MG PO CAPS: 100.0000 mg | ORAL_CAPSULE | Freq: Two times a day (BID) | ORAL | Status: DC

## 2012-10-03 NOTE — Progress Notes (Signed)
Subjective:    Patient ID: Debbie [name], female    DOB: July 20, 1970, 42 y.o.   MRN: [id number] Chief Complaint  Patient presents with  . Tick Removal    tick bite on left hip area; removed tick except for head of tick    HPI  This morning, Ms. Debbie [name] pulled a tick off of her left hip but she thinks the head or a leg or something is still left - she can't see it so doesn't know for sure but her husband and daughter still saw a spot.  It has been itching.  Her son had RMSF several yrs ago.  She struggles w/ gastroparesis - very severe case. Often whenever she eats any meat it will trigger horrible bloating and nausea - had a flair 2 wks ago after eating 1 slider at [employer] and then after that tried a boost but made her feel worse so for the past week she has only been able to eat mashed potatoes. Just was able to transition to chicken noodle soup but wonders if it would be safe to try the boost again.  Can't do yogurt - the taste makes her nauseated.  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  When she misses her estradiol, she gets very dizzy and lightheaded.  Past Medical History  Diagnosis Date  . Kidney stones   . Lupus   . Allergy   . Arthritis   . GERD (gastroesophageal reflux disease)   . Anemia    Current Outpatient Prescriptions on File Prior to Visit  Medication Sig Dispense Refill  . estradiol (ESTRACE) 1 MG tablet Take 1 mg by mouth daily.      . hydroxychloroquine (PLAQUENIL) 200 MG tablet Take 200 mg by mouth daily.      . polyethylene glycol (MIRALAX / GLYCOLAX) packet Take 17 g by mouth daily.       No current facility-administered medications on file prior to visit.   No Known Allergies   Review of Systems  Constitutional: Negative for fever, chills, diaphoresis and activity change.  Gastrointestinal: Positive for nausea, abdominal pain  and abdominal distention.  Musculoskeletal: Negative for myalgias, joint swelling, arthralgias and gait problem.  Skin: Positive for wound. Negative for color change, pallor and rash.  Neurological: Positive for dizziness, weakness and light-headedness.  Hematological: Negative for adenopathy.  Psychiatric/Behavioral: Negative for dysphoric mood. The patient is not nervous/anxious.       BP 126/84  Pulse 78  Temp(Src) 98.9 F (37.2 C)  Resp 18  Ht 5\' 2"  (1.575 m)  Wt 142 lb (64.411 kg)  BMI 25.97 kg/m2  SpO2 99% Objective:   Physical Exam  Constitutional: She is oriented to person, place, and time. She appears well-developed and well-nourished. No distress.  HENT:  Head: Normocephalic and atraumatic.  Right Ear: External ear normal.  Eyes: Conjunctivae are normal. No scleral icterus.  Pulmonary/Chest: Effort normal.  Neurological: She is alert and oriented to person, place, and time.  Skin: Skin is warm and dry. She is not diaphoretic. No erythema.     Left hip with small 3mm erythematous papule with central 0.30mm brown speck  Psychiatric: She has a normal mood and affect. Her behavior is normal.          Assessment & Plan:  Tick bite of hip, left, initial encounter - pinpoint wound -  with tiny brown spot - explored with forceps and 27g needle.  I suspect this is actually from the tick bite itself as no retained tick products seen or palpated.  Will cover with doxycycline prophylactically since the transmissible ticks are on her property per family hx and she can't identify type.   Meds ordered this encounter  Medications  . ondansetron (ZOFRAN-ODT) 4 MG disintegrating tablet    Sig: Take 4 mg by mouth every 8 (eight) hours as needed for nausea.  Marland Kitchen dicyclomine (BENTYL) 20 MG tablet    Sig: Take 20 mg by mouth every 6 (six) hours.  Marland Kitchen doxycycline (VIBRAMYCIN) 100 MG capsule    Sig: Take 1 capsule (100 mg total) by mouth 2 (two) times daily.    Dispense:  14 capsule     Refill:  0   Gastroparesis - pt has nutritionist appt in 3 wks. Gave info on [facility]

## 2012-10-11 ENCOUNTER — Telehealth: Payer: Self-pay

## 2012-10-11 ENCOUNTER — Ambulatory Visit (INDEPENDENT_AMBULATORY_CARE_PROVIDER_SITE_OTHER): Payer: BC Managed Care – PPO | Admitting: Family Medicine

## 2012-10-11 VITALS — BP 122/79 | HR 73 | Temp 98.3°F | Resp 16 | Ht 62.0 in | Wt 142.0 lb

## 2012-10-11 DIAGNOSIS — N200 Calculus of kidney: Secondary | ICD-10-CM

## 2012-10-11 DIAGNOSIS — R42 Dizziness and giddiness: Secondary | ICD-10-CM

## 2012-10-11 DIAGNOSIS — R51 Headache: Secondary | ICD-10-CM

## 2012-10-11 DIAGNOSIS — M329 Systemic lupus erythematosus, unspecified: Secondary | ICD-10-CM

## 2012-10-11 DIAGNOSIS — G8929 Other chronic pain: Secondary | ICD-10-CM

## 2012-10-11 DIAGNOSIS — M359 Systemic involvement of connective tissue, unspecified: Secondary | ICD-10-CM | POA: Insufficient documentation

## 2012-10-11 LAB — POCT URINALYSIS DIPSTICK
Bilirubin, UA: NEGATIVE
Blood, UA: NEGATIVE
Glucose, UA: NEGATIVE
Ketones, UA: NEGATIVE
Leukocytes, UA: NEGATIVE
Nitrite, UA: NEGATIVE
Protein, UA: NEGATIVE
Spec Grav, UA: 1.015
Urobilinogen, UA: 0.2
pH, UA: 7

## 2012-10-11 LAB — POCT CBC
Granulocyte percent: 58.7 %G (ref 37–80)
HCT, POC: 39.9 % (ref 37.7–47.9)
Hemoglobin: 13 g/dL (ref 12.2–16.2)
Lymph, poc: 2.3 (ref 0.6–3.4)
MCH, POC: 29.3 pg (ref 27–31.2)
MCHC: 32.6 g/dL (ref 31.8–35.4)
MCV: 90 fL (ref 80–97)
MID (cbc): 0.5 (ref 0–0.9)
MPV: 7.8 fL (ref 0–99.8)
POC Granulocyte: 3.9 (ref 2–6.9)
POC LYMPH PERCENT: 33.9 %L (ref 10–50)
POC MID %: 7.4 %M (ref 0–12)
Platelet Count, POC: 253 10*3/uL (ref 142–424)
RBC: 4.43 M/uL (ref 4.04–5.48)
RDW, POC: 12.8 %
WBC: 6.7 10*3/uL (ref 4.6–10.2)

## 2012-10-11 LAB — GLUCOSE, POCT (MANUAL RESULT ENTRY): POC Glucose: 112 mg/dl — AB (ref 70–99)

## 2012-10-11 MED ORDER — PREDNISONE 20 MG PO TABS: ORAL_TABLET | ORAL | Status: DC
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
## 2012-10-11 NOTE — Addendum Note (Signed)
Addended by: Elvina Sidle on: 10/11/2012 05:33 PM   Modules accepted: Orders
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
## 2012-10-11 NOTE — Telephone Encounter (Signed)
Pt has questions about the tick bite she was seen for recently and the medication  Best number [phone]

## 2012-10-11 NOTE — Telephone Encounter (Signed)
Dizziness has nothing to do with tick. RTC.

## 2012-10-11 NOTE — Telephone Encounter (Signed)
She has been advised to come in for this, because multiple medical issues can cause dizziness, none of which are good.

## 2012-10-12 ENCOUNTER — Emergency Department (HOSPITAL_COMMUNITY)
Admission: EM | Admit: 2012-10-12 | Discharge: 2012-10-12 | Disposition: A | Discharge status: Home / Self Care | Payer: BC Managed Care – PPO | Attending: Emergency Medicine | Admitting: Emergency Medicine

## 2012-10-12 ENCOUNTER — Emergency Department (HOSPITAL_COMMUNITY): Payer: BC Managed Care – PPO

## 2012-10-12 DIAGNOSIS — Z87442 Personal history of urinary calculi: Secondary | ICD-10-CM | POA: Insufficient documentation

## 2012-10-12 DIAGNOSIS — Z862 Personal history of diseases of the blood and blood-forming organs and certain disorders involving the immune mechanism: Secondary | ICD-10-CM | POA: Insufficient documentation

## 2012-10-12 DIAGNOSIS — Z79899 Other long term (current) drug therapy: Secondary | ICD-10-CM | POA: Insufficient documentation

## 2012-10-12 DIAGNOSIS — Z792 Long term (current) use of antibiotics: Secondary | ICD-10-CM | POA: Insufficient documentation

## 2012-10-12 DIAGNOSIS — R358 Other polyuria: Secondary | ICD-10-CM

## 2012-10-12 DIAGNOSIS — R51 Headache: Secondary | ICD-10-CM | POA: Insufficient documentation

## 2012-10-12 DIAGNOSIS — Z8739 Personal history of other diseases of the musculoskeletal system and connective tissue: Secondary | ICD-10-CM | POA: Insufficient documentation

## 2012-10-12 DIAGNOSIS — R3589 Other polyuria: Secondary | ICD-10-CM | POA: Insufficient documentation

## 2012-10-12 DIAGNOSIS — Z8719 Personal history of other diseases of the digestive system: Secondary | ICD-10-CM | POA: Insufficient documentation

## 2012-10-12 DIAGNOSIS — R42 Dizziness and giddiness: Secondary | ICD-10-CM | POA: Insufficient documentation

## 2012-10-12 LAB — URINALYSIS, ROUTINE W REFLEX MICROSCOPIC
Glucose, UA: NEGATIVE mg/dL
Ketones, ur: NEGATIVE mg/dL
Leukocytes, UA: NEGATIVE
Protein, ur: NEGATIVE mg/dL

## 2012-10-12 LAB — COMPREHENSIVE METABOLIC PANEL
ALT: 78 U/L — ABNORMAL HIGH (ref 0–35)
AST: 51 U/L — ABNORMAL HIGH (ref 0–37)
Albumin: 4.3 g/dL (ref 3.5–5.2)
Alkaline Phosphatase: 124 U/L — ABNORMAL HIGH (ref 39–117)
BUN: 14 mg/dL (ref 6–23)
CO2: 30 mEq/L (ref 19–32)
Calcium: 10.1 mg/dL (ref 8.4–10.5)
Chloride: 104 mEq/L (ref 96–112)
Creat: 0.75 mg/dL (ref 0.50–1.10)
Glucose, Bld: 83 mg/dL (ref 70–99)
Potassium: 4.3 mEq/L (ref 3.5–5.3)
Sodium: 138 mEq/L (ref 135–145)
Total Bilirubin: 0.3 mg/dL (ref 0.3–1.2)
Total Protein: 7 g/dL (ref 6.0–8.3)

## 2012-10-12 LAB — CBC WITH DIFFERENTIAL/PLATELET
Eosinophils Absolute: 0.1 10*3/uL (ref 0.0–0.7)
Eosinophils Relative: 1 % (ref 0–5)
Lymphs Abs: 1.6 10*3/uL (ref 0.7–4.0)
MCH: 29.4 pg (ref 26.0–34.0)
MCHC: 35 g/dL (ref 30.0–36.0)
MCV: 84 fL (ref 78.0–100.0)
Platelets: 247 10*3/uL (ref 150–400)
RBC: 4.25 MIL/uL (ref 3.87–5.11)
RDW: 12.6 % (ref 11.5–15.5)

## 2012-10-12 LAB — POCT I-STAT, CHEM 8
BUN: 17 mg/dL (ref 6–23)
Calcium, Ion: 1.26 mmol/L — ABNORMAL HIGH (ref 1.12–1.23)
Chloride: 107 meq/L (ref 96–112)
Creatinine, Ser: 0.7 mg/dL (ref 0.50–1.10)
Glucose, Bld: 92 mg/dL (ref 70–99)
HCT: 37 % (ref 36.0–46.0)
Hemoglobin: 12.6 g/dL (ref 12.0–15.0)
Potassium: 3.9 meq/L (ref 3.5–5.1)
Sodium: 141 meq/L (ref 135–145)
TCO2: 28 mmol/L (ref 0–100)

## 2012-10-12 LAB — SEDIMENTATION RATE: Sed Rate: 9 mm/hr (ref 0–22)

## 2012-10-12 MED ORDER — METOCLOPRAMIDE HCL 5 MG/ML IJ SOLN
10.0000 mg | Freq: Once | INTRAMUSCULAR | Status: AC
  Administered 2012-10-12: 10 mg via INTRAMUSCULAR
  Filled 2012-10-12: qty 2

## 2012-10-12 MED ORDER — DIPHENHYDRAMINE HCL 50 MG/ML IJ SOLN
12.5000 mg | Freq: Once | INTRAMUSCULAR | Status: AC
  Administered 2012-10-12: 12.5 mg via INTRAMUSCULAR
  Filled 2012-10-12: qty 1

## 2012-10-12 MED ORDER — MECLIZINE HCL 25 MG PO TABS
50.0000 mg | ORAL_TABLET | Freq: Once | ORAL | Status: AC
  Administered 2012-10-12: 50 mg via ORAL
  Filled 2012-10-12: qty 2

## 2012-10-12 MED ORDER — GADOBENATE DIMEGLUMINE 529 MG/ML IV SOLN
15.0000 mL | Freq: Once | INTRAVENOUS | Status: AC
  Administered 2012-10-12: 15 mL via INTRAVENOUS

## 2012-10-12 MED ORDER — KETOROLAC TROMETHAMINE 60 MG/2ML IM SOLN
60.0000 mg | Freq: Once | INTRAMUSCULAR | Status: AC
  Administered 2012-10-12: 60 mg via INTRAMUSCULAR
  Filled 2012-10-12: qty 2

## 2012-10-12 MED ORDER — ONDANSETRON 4 MG PO TBDP
8.0000 mg | ORAL_TABLET | Freq: Once | ORAL | Status: DC
  Filled 2012-10-12: qty 1

## 2012-10-12 MED ORDER — LORAZEPAM 1 MG PO TABS
1.0000 mg | ORAL_TABLET | Freq: Once | ORAL | Status: AC
  Administered 2012-10-12: 1 mg via ORAL
  Filled 2012-10-12: qty 1

## 2012-10-12 MED ORDER — NITROGLYCERIN 0.4 MG SL SUBL: 0.4000 mg | SUBLINGUAL_TABLET | SUBLINGUAL | Status: DC | PRN

## 2012-10-12 NOTE — ED Provider Notes (Signed)
History     CSN: [id number]  Arrival date & time 10/12/12  1319   First MD Initiated Contact with Patient 10/12/12 1337      Chief Complaint  Patient presents with  . Headache  . Dizziness    (Consider location/radiation/quality/duration/timing/severity/associated sxs/prior treatment) HPI Debbie [name] is a 42 y.o. female who presents to ED with complaint of nausea, headache, dizziness. States symptoms began 3 days ago. States mild at that time. Went to [facility] due to worsening dizziness. States had some blood drawn and was told this could be due to her lupus. Was given prednisone which she did not fill or take. States today symptoms are even worse. States she is unable to walk, unable to eat or drink. States she did not take any medications for this. States positional changes make dizziness worse. Closing eyes and sitting still makes it better. States headache in left temple. Photosensitivity. States has hx of migraines but does not remember feeling like this in the past.    Past Medical History  Diagnosis Date  . Kidney stones   . Lupus   . Allergy   . Arthritis   . GERD (gastroesophageal reflux disease)   . Anemia     Past Surgical History  Procedure Laterality Date  . Abdominal hysterectomy    . Cholecystectomy      No family history on file.  History  Substance Use Topics  . Smoking status: Never Smoker   . Smokeless tobacco: Not on file  . Alcohol Use: No    OB History   Grav Para Term Preterm Abortions TAB SAB Ect Mult Living                  Review of Systems  Constitutional: Positive for fatigue. Negative for fever and chills.  HENT: Negative for neck pain and neck stiffness.   Respiratory: Negative.   Cardiovascular: Negative.   Gastrointestinal: Negative.   Genitourinary: Negative for dysuria and flank pain.  Musculoskeletal: Negative.   Skin: Negative.   Neurological: Positive for dizziness, light-headedness and headaches. Negative for  speech difficulty and weakness.    Allergies  Review of patient's allergies indicates no known allergies.  Home Medications   Current Outpatient Rx  Name  Route  Sig  Dispense  Refill  . Ascorbic Acid (VITAMIN C PO)   Oral   Take 1 tablet by mouth daily.         . Cyanocobalamin (VITAMIN B-12 PO)   Oral   Take 1 tablet by mouth daily.         Marland Kitchen dicyclomine (BENTYL) 20 MG tablet   Oral   Take 20 mg by mouth every 6 (six) hours as needed (for IBS).          Marland Kitchen estradiol (ESTRACE) 1 MG tablet   Oral   Take 1 mg by mouth daily.         . hydroxychloroquine (PLAQUENIL) 200 MG tablet   Oral   Take 200 mg by mouth daily.         . Multiple Vitamin (MULTIVITAMIN WITH MINERALS) TABS   Oral   Take 1 tablet by mouth daily.         . ondansetron (ZOFRAN-ODT) 4 MG disintegrating tablet   Oral   Take 4 mg by mouth every 8 (eight) hours as needed for nausea.         . polyethylene glycol (MIRALAX / GLYCOLAX) packet   Oral   Take  17 g by mouth daily.           BP 124/80  Pulse 86  Temp(Src) 98.2 F (36.8 C) (Oral)  Resp 16  SpO2 100%  Physical Exam  Nursing note and vitals reviewed. Constitutional: She is oriented to person, place, and time. She appears well-developed and well-nourished. No distress.  HENT:  Head: Normocephalic and atraumatic.  Right Ear: External ear normal.  Left Ear: External ear normal.  Nose: Nose normal.  Mouth/Throat: Oropharynx is clear and moist.  Eyes: Conjunctivae and EOM are normal. Pupils are equal, round, and reactive to light.  Neck: Normal range of motion. Neck supple.  Cardiovascular: Normal rate, regular rhythm and normal heart sounds.   Pulmonary/Chest: Effort normal and breath sounds normal. No respiratory distress. She has no wheezes. She has no rales.  Abdominal: Soft. Bowel sounds are normal. She exhibits no distension. There is no tenderness.  Musculoskeletal: She exhibits no edema.  Neurological: She is alert  and oriented to person, place, and time. No cranial nerve deficit. Coordination normal.  Horizontal nystagmus noted. Pt's movements are slow. Visual fields intact. Gait ataxic, slow steps, wide  Skin: Skin is warm and dry.  Psychiatric: She has a normal mood and affect. Her behavior is normal.    ED Course  Procedures (including critical care time)  Results for orders placed during the hospital encounter of 10/12/12  CBC WITH DIFFERENTIAL      Result Value Range   WBC 7.9  4.0 - 10.5 K/uL   RBC 4.25  3.87 - 5.11 MIL/uL   Hemoglobin 12.5  12.0 - 15.0 g/dL   HCT 16.1 (*) 09.6 - 04.5 %   MCV 84.0  78.0 - 100.0 fL   MCH 29.4  26.0 - 34.0 pg   MCHC 35.0  30.0 - 36.0 g/dL   RDW 40.9  81.1 - 91.4 %   Platelets 247  150 - 400 K/uL   Neutrophils Relative % 72  43 - 77 %   Neutro Abs 5.7  1.7 - 7.7 K/uL   Lymphocytes Relative 20  12 - 46 %   Lymphs Abs 1.6  0.7 - 4.0 K/uL   Monocytes Relative 7  3 - 12 %   Monocytes Absolute 0.5  0.1 - 1.0 K/uL   Eosinophils Relative 1  0 - 5 %   Eosinophils Absolute 0.1  0.0 - 0.7 K/uL   Basophils Relative 0  0 - 1 %   Basophils Absolute 0.0  0.0 - 0.1 K/uL  URINALYSIS, ROUTINE W REFLEX MICROSCOPIC      Result Value Range   Color, Urine YELLOW  YELLOW   APPearance CLEAR  CLEAR   Specific Gravity, Urine 1.014  1.005 - 1.030   pH 7.0  5.0 - 8.0   Glucose, UA NEGATIVE  NEGATIVE mg/dL   Hgb urine dipstick NEGATIVE  NEGATIVE   Bilirubin Urine NEGATIVE  NEGATIVE   Ketones, ur NEGATIVE  NEGATIVE mg/dL   Protein, ur NEGATIVE  NEGATIVE mg/dL   Urobilinogen, UA 0.2  0.0 - 1.0 mg/dL   Nitrite NEGATIVE  NEGATIVE   Leukocytes, UA NEGATIVE  NEGATIVE  POCT I-STAT, CHEM 8      Result Value Range   Sodium 141  135 - 145 mEq/L   Potassium 3.9  3.5 - 5.1 mEq/L   Chloride 107  96 - 112 mEq/L   BUN 17  6 - 23 mg/dL   Creatinine, Ser 7.82  0.50 - 1.10 mg/dL  Glucose, Bld 92  70 - 99 mg/dL   Calcium, Ion 1.61 (*) 1.12 - 1.23 mmol/L   TCO2 28  0 - 100 mmol/L    Hemoglobin 12.6  12.0 - 15.0 g/dL   HCT 09.6  04.5 - 40.9 %    1. Dizziness   2. Polyuria   3. Headache(784.0)       MDM  Pt with dizziness, headache for 3 days. dizziness worsened, unable to walk, worsened with standing up and position changes. On exam, movements are slow, gait ataxic, horizontal nystagmus noted. Suspect vertigo peripheral (most likely) vs central. Pt did have blood tests drawn incluching RMSF titers, lyme titers, and sed rate. Do not have these results back yet. Pt treated in ED with meclizine 50mg  PO, ativan 1mg  PO, toradol 60mg  IM, reglan 10mg IM with no improvements with only mild improvement in her symptoms. i tried to walk her which she is still unable to do. Discussed with dr. Oletta Lamas, who discussed pt with radiologist, will get MRA neck, MRI neck and brain for further evaluation given pt's hx of lupus to r/o central causes of her dizziness. Pt informed of the plan. Pt signed out to next oncoming shift PA [place]ink  BP 121/83  Pulse 78  Temp(Src) 98.1 F (36.7 C) (Oral)  Resp 16  SpO2 100%       Lottie Mussel, PA-C 10/13/12 [phone]

## 2012-10-12 NOTE — ED Notes (Signed)
Patient has arrived in pod c to await mri. Spouse at bedside

## 2012-10-12 NOTE — ED Provider Notes (Signed)
Patient was received at sign out disposition pending MR scans from [facility]. Scans returned w/o acute findings. Pt notified of results and states her symptoms are improving. Patient will be discharged with follow up with PCP. Return precautions given. Patient is stable at time of discharge   Jeannetta Ellis, PA-C 10/13/12 1133

## 2012-10-12 NOTE — ED Notes (Signed)
Pt denies any any pain or questions upon discharge.

## 2012-10-12 NOTE — ED Notes (Signed)
According to EMS, patient said she started having dizziness and headache, she is nauseated.  According to EMS it started on Saturday.  Patient took tylenol, patient does have a history of migraines, Lupus, GERD.  EMS was called by the school and they transported the patient here.

## 2012-10-12 NOTE — ED Notes (Signed)
Report received, 1st contact with pt and will assume care of pt at this time. Pt is in testing at this time.

## 2012-10-13 LAB — ROCKY MTN SPOTTED FVR AB, IGM-BLOOD: ROCKY MTN SPOTTED FEVER, IGM: 0.35 IV

## 2012-10-13 LAB — EHRLICHIA ANTIBODY PANEL
E chaffeensis (HGE) Ab, IgG: NEGATIVE
E chaffeensis (HGE) Ab, IgM: NEGATIVE

## 2012-10-13 NOTE — ED Provider Notes (Signed)
Medical screening examination/treatment/procedure(s) were performed by non-physician practitioner and as supervising physician I was immediately available for consultation/collaboration.   Glynn Octave, MD 10/13/12 1147

## 2012-10-14 NOTE — ED Provider Notes (Signed)
Medical screening examination/treatment/procedure(s) were performed by non-physician practitioner and as supervising physician I was immediately available for consultation/collaboration.   Debbie [name]. Sarvesh Meddaugh, MD 10/14/12 1250

## 2012-11-28 ENCOUNTER — Ambulatory Visit (INDEPENDENT_AMBULATORY_CARE_PROVIDER_SITE_OTHER): Payer: BC Managed Care – PPO | Admitting: Emergency Medicine

## 2012-11-28 VITALS — BP 118/80 | HR 66 | Temp 98.1°F | Resp 16 | Ht 62.0 in | Wt 143.0 lb

## 2012-11-28 DIAGNOSIS — M79609 Pain in unspecified limb: Secondary | ICD-10-CM

## 2012-11-28 DIAGNOSIS — G5681 Other specified mononeuropathies of right upper limb: Secondary | ICD-10-CM

## 2012-11-28 DIAGNOSIS — G5680 Other specified mononeuropathies of unspecified upper limb: Secondary | ICD-10-CM

## 2012-11-28 MED ORDER — TRAMADOL HCL 50 MG PO TABS: 50.0000 mg | ORAL_TABLET | Freq: Four times a day (QID) | ORAL | Status: DC | PRN

## 2012-11-28 MED ORDER — NAPROXEN SODIUM 550 MG PO TABS: 550.0000 mg | ORAL_TABLET | Freq: Two times a day (BID) | ORAL | Status: DC

## 2012-11-28 MED ORDER — METHYLPREDNISOLONE ACETATE 80 MG/ML IJ SUSP: 80.0000 mg | Freq: Once | INTRAMUSCULAR | Status: DC

## 2012-11-28 MED ORDER — CYCLOBENZAPRINE HCL 10 MG PO TABS: 10.0000 mg | ORAL_TABLET | Freq: Three times a day (TID) | ORAL | Status: DC | PRN

## 2012-11-28 NOTE — Progress Notes (Signed)
[facility] [address], [place] [address] [phone]  Date:  11/28/2012   Name:  Debbie [name]   DOB:  March 10, 1971   MRN:  [id number]  PCP:  Lise Auer, MD    Chief Complaint: Arm Pain   History of Present Illness:  Debbie [name] is a 42 y.o. 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  No history of injury or overuse.  Pain worse with movement or touch. Has paresthesias in fingers.  Mild weakness in right hand.  Seen in ER a month ago with dizziness and underwent normal MR of head and neck, and a normal MRA.  No diagnosis was made.  She has a history of lupus and was given a prescription for steroids at her last visit that she did not have filled.  She has less neuro symptoms but still has some residual lightheadedness.  No improvement with over the counter medications or other home remedies. Denies other complaint or health concern today.     Patient Active Problem List   Diagnosis Date Noted  . Lupus 10/11/2012  . Kidney stones 10/11/2012  . Chronic headaches 10/11/2012    Past Medical History  Diagnosis Date  . Kidney stones   . Lupus   . Allergy   . Arthritis   . GERD (gastroesophageal reflux disease)   . Anemia     Past Surgical History  Procedure Laterality Date  . Abdominal hysterectomy    . Cholecystectomy    . Carpal tunnel release Right     History  Substance Use Topics  . Smoking status: Never Smoker   . Smokeless tobacco: Not on file  . Alcohol Use: Yes    Family History  Problem Relation Age of Onset  . Arthritis Mother   . Hernia Mother   . Diabetes Father   . Lupus Sister   . Diabetes Maternal Grandmother   . Cancer Maternal Grandfather   . Diabetes Paternal Grandfather   . Heart disease Paternal Grandfather     No Known Allergies  Medication list has been reviewed and updated.  Current Outpatient  Prescriptions on File Prior to Visit  Medication Sig Dispense Refill  . Ascorbic Acid (VITAMIN C PO) Take 1 tablet by mouth daily.      Marland Kitchen dicyclomine (BENTYL) 20 MG tablet Take 20 mg by mouth every 6 (six) hours as needed (for IBS).       Marland Kitchen estradiol (ESTRACE) 1 MG tablet Take 1 mg by mouth daily.      . hydroxychloroquine (PLAQUENIL) 200 MG tablet Take 200 mg by mouth daily.      . Multiple Vitamin (MULTIVITAMIN WITH MINERALS) TABS Take 1 tablet by mouth daily.      . polyethylene glycol (MIRALAX / GLYCOLAX) packet Take 17 g by mouth daily.      . Cyanocobalamin (VITAMIN B-12 PO) Take 1 tablet by mouth daily.      . ondansetron (ZOFRAN-ODT) 4 MG disintegrating tablet Take 4 mg by mouth every 8 (eight) hours as needed for nausea.       No current facility-administered medications on file prior to visit.    Review of Systems:  As per HPI, otherwise negative.    Physical Examination: Filed Vitals:   11/28/12 0956  BP: 118/80  Pulse: 66  Temp: 98.1 F (36.7 C)  Resp: 16   Filed Vitals:  11/28/12 0956  Height: 5\' 2"  (1.575 m)  Weight: 143 lb (64.864 kg)   Body mass index is 26.15 kg/(m^2). Ideal Body Weight: Weight in (lb) to have BMI = 25: 136.4   GEN: WDWN, NAD, Non-toxic, Alert & Oriented x 3 HEENT: Atraumatic, Normocephalic.  Ears and Nose: No external deformity. EXTR: No clubbing/cyanosis/edema NEURO: Normal gait.  Gross motor and cerebellar and sensory exam uppers.   PSYCH: Normally interactive. Conversant. Not depressed or anxious appearing.  Calm demeanor.  NECK:  Muscle spasm right trapezius with trigger point medial to angle of scapula.    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

## 2012-11-28 NOTE — Addendum Note (Signed)
Addended by: Carmelina Dane on: 11/28/2012 11:15 AM   Modules accepted: Orders

## 2012-11-29 ENCOUNTER — Telehealth: Payer: Self-pay

## 2012-11-29 NOTE — Telephone Encounter (Signed)
Pt states pain is unbearable and wants to know if she can double up on pain meds

## 2012-11-29 NOTE — Telephone Encounter (Signed)
She is advised to use heat and rest area, give this more time. Will take 2 more days for the injection to work. She is advised to stretch her upper extremity, and do gentle range of motion, and give 2 more days, if not improved she will let me know. To you FYI.

## 2012-11-29 NOTE — Telephone Encounter (Signed)
PT STATES SHE WAS SEEN BY DR Gale Journey AND THE MEDICINE SHE WAS GIVEN FOR PAIN ISN'T HELPING. PLEASE CALL [phone]   [facility] [place]

## 2013-01-13 ENCOUNTER — Encounter: Payer: Self-pay | Admitting: Neurology

## 2013-01-13 ENCOUNTER — Ambulatory Visit (INDEPENDENT_AMBULATORY_CARE_PROVIDER_SITE_OTHER): Payer: BC Managed Care – PPO | Admitting: Neurology

## 2013-01-13 VITALS — BP 117/80 | HR 83 | Resp 14 | Ht 62.0 in | Wt 144.0 lb

## 2013-01-13 DIAGNOSIS — B0223 Postherpetic polyneuropathy: Secondary | ICD-10-CM | POA: Insufficient documentation

## 2013-01-13 DIAGNOSIS — B0229 Other postherpetic nervous system involvement: Secondary | ICD-10-CM

## 2013-01-13 DIAGNOSIS — M79631 Pain in right forearm: Secondary | ICD-10-CM | POA: Insufficient documentation

## 2013-01-13 MED ORDER — NONFORMULARY OR COMPOUNDED ITEM: Status: DC

## 2013-01-13 NOTE — Patient Instructions (Signed)
Postherpetic Neuralgia Shingles is a painful disease. It is caused by the herpes zoster virus. This is the same virus which also causes chickenpox. It can affect the torso, limbs, or the face. For most people, shingles is a condition of rather sudden onset. Pain usually lasts about 1 month. In older patients, or patients with poor immune systems, a painful, long-standing (chronic) condition called postherpetic neuralgia can develop. This condition rarely happens before age 50. But at least 50% of people over 50 become affected following an attack of shingles. There is a natural tendency for this condition to improve over time with no treatment. Less than 5% of patients have pain that lasts for more than 1 year. DIAGNOSIS  Herpes is usually easily diagnosed on physical exam. Pain sometimes follows when the skin sores (lesions) have disappeared. It is called postherpetic neuralgia. That name simply means the pain that follows herpes. TREATMENT   Treating this condition may be difficult. Usually one of the tricyclic antidepressants, often amitriptyline, is the first line of treatment. There is evidence that the sooner these medications are given, the more likely they are to reduce pain.  Conventional analgesics, regional nerve blocks, and anticonvulsants have little benefit in most cases when used alone. Other tricyclic anti-depressants are used as a second option if the first antidepressant is unsuccessful.  Anticonvulsants, including carbamazepine, have been found to provide some added benefit when used with a tricyclic anti-depressant. This is especially for the stabbing type of pain similar to that of trigeminal neuralgia.  Chronic opioid therapy. This is a strong narcotic pain medication. It is used to treat pain that is resistant to other measures. The issues of dependency and tolerance can be reduced with closely managed care.  Some cream treatments are applied locally to the affected area. They  can help when used with other treatments. Their use may be difficult in the case of postherpetic trigeminal neuralgia. This is involved with the face. So the substances can irritate the eye and the skin around the eye. Examples of creams used include Capsaicin and lidocaine creams.  For shingles, antiviral therapies along with analgesics are recommended. Studies of the effect of anti-viral agents such as acyclovir on shingles have been done. They show improved rates of healing and decreased severity of sudden (acute) pain. Some observations suggest that nerve blocks during shingles infection will:  Reduce pain.  Shorten the acute episode.  Prevent the emergence of postherpetic neuralgia. Viral medications used include Acyclovir (Zovirax), Valacyclovir, Famciclovir and a lysine diet. Document Released: 07/26/2002 Document Revised: 07/28/2011 Document Reviewed: 05/05/2005 [employer] Patient Information 2014 [employer].  

## 2013-01-13 NOTE — Progress Notes (Signed)
[facility]  Provider:  Melvyn Novas, M D  Referring Provider: Lise Auer, MD Primary Care Physician:  Lise Auer, MD  Chief Complaint  Patient presents with  . New Evaluation    [name],parashthesias, paper referral,rm 11    HPI:  Debbie [name] is a 42 y.o. female  Is seen here as a referral/ revisit  from Dr. Welton Flakes for painful dysaesthesias in the right arm and forearm.   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 She developed blisters and rash into the6th and 7 th  cervical nerve distribution and partially in the palm.  Her pain has benn slowly improving but she still unable to fall asleep without her pain medication of narcotic character.  The quality of the pain is neuralgic, and likely postherpetic. In May 2014 she was seen by Dr. Marijean Bravo , in  rheumatology, who  obtained a CBC with differential which was entirely normal and a hepatic function panel as well as creatinine,  Albumin- she had very mildly elevated liver function tests on that exam her ALT was 70 units per liter and her AST 44 units per liter.  The patient has been followed by a gastroenterologist, who  did not find these enzyme levels alarming. Creatinine was not elevated , albumin was in normal range.   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 The patient also tested HLAB 27 positive. She does not have cervitis, uveitis. There were symptoms of a possible carpal tunnel syndrome in her visit dated 03-31-29. The patient is dramatically young but she is in menopause, confirmed by lab reports from last year.  She also has not received her shingles vaccination think that as soon as 6 months after the first symptoms of zoster appeared she should  undergo vaccination to prevent further outbreaks.   Review of Systems: Out of a complete 14 system review, the patient complains of only the following symptoms, and all other reviewed systems are negative. 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 Marital Status: Married    Spouse Name: N/A    Number of Children: 2  . Years of Education: BS   Occupational History  . Teach     Elementary School   Social History Main Topics  . Smoking status: Never Smoker   . Smokeless tobacco: Never Used  . Alcohol Use: Yes     Comment: rarely  . Drug Use: Yes  . Sexual Activity: Yes   Other Topics Concern  . Not on file   Social History Narrative   Married. Education: [employer]. Exercise 3 times 1 hour or so. Patient consumes 1 or 2 cups of coffee and does exercise.    Family History  Problem Relation Age of Onset  . Arthritis Mother   . Hernia Mother   . Cancer Mother     melanoma  . Hypertension Mother   . Diabetes Father   . Lupus Sister   . Diabetes Maternal Grandmother   . Cancer Maternal Grandfather   . Diabetes Paternal Grandfather   . Heart disease Paternal Grandfather   . Heart disease Paternal Grandmother   . Diabetes Other     Past Medical History  Diagnosis Date  .  Kidney stones   . Lupus   . Allergy   . Arthritis   . GERD (gastroesophageal reflux disease)   . Anemia   . Menopause   . Gastroparesis   . Anorexia nervosa with bulimia 2004  . Pain and swelling of right forearm     Past Surgical History  Procedure Laterality Date  . Abdominal hysterectomy      2x,2008-2009  . Cholecystectomy    . Carpal tunnel release Right   . Kidney stone surgery      5  . Ovarian cyst surgery  2007    LAP    Current Outpatient Prescriptions  Medication Sig Dispense Refill  . Ascorbic Acid (VITAMIN C PO) Take 1 tablet by mouth daily.      . Cyanocobalamin (VITAMIN B-12 PO) Take 1 tablet  by mouth daily.      . cyclobenzaprine (FLEXERIL) 10 MG tablet Take 1 tablet (10 mg total) by mouth 3 (three) times daily as needed for muscle spasms.  30 tablet  0  . dicyclomine (BENTYL) 20 MG tablet Take 20 mg by mouth every 6 (six) hours as needed (for IBS).       Marland Kitchen estradiol (ESTRACE) 1 MG tablet Take 1 mg by mouth daily.      . hydroxychloroquine (PLAQUENIL) 200 MG tablet Take 200 mg by mouth daily.      . Multiple Vitamin (MULTIVITAMIN WITH MINERALS) TABS Take 1 tablet by mouth daily.      . polyethylene glycol (MIRALAX / GLYCOLAX) packet Take 17 g by mouth daily.      . pregabalin (LYRICA) 150 MG capsule Take 150 mg by mouth 2 (two) times daily. Two times daily      . Promethazine HCl (PHENERGAN PO) Take by mouth daily.      . traMADol (ULTRAM) 50 MG tablet Take 1 tablet (50 mg total) by mouth every 6 (six) hours as needed for pain.  50 tablet  0  . famciclovir (FAMVIR) 500 MG tablet       . meclizine (ANTIVERT) 25 MG tablet       . oxyCODONE-acetaminophen (PERCOCET/ROXICET) 5-325 MG per tablet Dr . Welton Flakes       Current Facility-Administered Medications  Medication Dose Route Frequency Provider Last Rate Last Dose  . methylPREDNISolone acetate (DEPO-MEDROL) injection 80 mg  80 mg Intramuscular Once Phillips Odor, MD        Allergies as of 01/13/2013  . (No Known Allergies)    Vitals: BP 117/80  Pulse 83  Resp 14  Ht 5\' 2"  (1.575 m)  Wt 144 lb (65.318 kg)  BMI 26.33 kg/m2 Last Weight:  Wt Readings from Last 1 Encounters:  01/13/13 144 lb (65.318 kg)   Last Height:   Ht Readings from Last 1 Encounters:  01/13/13 5\' 2"  (1.575 m)    Physical exam:  General: The patient is awake, alert and appears not in acute distress. The patient is well groomed. Head: Normocephalic, atraumatic. Neck is supple. LCTA, Abdomen is soft.  Skin on the right forearm show small blister scars.  No edema and no active rash, no butterfly rash.   The patient's cranial nerve exam shows = is to  pupils that react promptly to light. No ciliary injection and no corneal abrasion, no visual field deficit. There is no facial asymmetry tongue and uvula are midline. There is no asymmetry of the shoulder and neck movements.  Tendon reflexes are symmetric, finger-to-nose testing witht mild ataxia in the right  arm no associated tremor.  Assistive devices not needed for gait but the patient reports lightheadedness. This may be related to Lyrica.  Sensation is affected in the dermatomal distribution of the cervical 6 th and seventh nerve on the right arm.       Assessment:  After physical and neurologic examination, review of laboratory studies and pre-existing records, assessment is that of shingles :  herpes zoster within the above described distribution. The patient has a history of autoimmune disorders and is not on immunosuppressants medication, I would strongly recommend a chickenpox vaccination-shingles vaccination for her. In the past we were advised to wait 6 months after an old of shingles before a vaccination could be given, but I'm not sure that this is the current standard of care. I suggest to get a vaccination within this calendar year. I also offered the patient a ointment which will be compounded for her and should be distributed over the affected dermatomes. This probably makes the systemic pain medication intake by mouth less necessary or at least less frequently necessary. I hope also that she would be able to reduce the Lyrica which seems to cause lightheadedness.   Plan:  Treatment plan and additional workup : as above .

## 2013-02-24 ENCOUNTER — Telehealth: Payer: Self-pay | Admitting: Nurse Practitioner

## 2013-02-24 NOTE — Telephone Encounter (Signed)
Left message about cancellation of appt. On 03/11/13 and asked pt to return call to reschedule, due to schedule change.

## 2013-03-11 ENCOUNTER — Ambulatory Visit: Payer: Self-pay | Admitting: Nurse Practitioner

## 2013-10-16 ENCOUNTER — Ambulatory Visit (INDEPENDENT_AMBULATORY_CARE_PROVIDER_SITE_OTHER): Payer: BC Managed Care – PPO | Admitting: Emergency Medicine

## 2013-10-16 VITALS — BP 102/74 | HR 97 | Temp 97.8°F | Resp 20 | Ht 61.0 in | Wt 139.8 lb

## 2013-10-16 DIAGNOSIS — J209 Acute bronchitis, unspecified: Secondary | ICD-10-CM

## 2013-10-16 MED ORDER — HYDROCOD POLST-CHLORPHEN POLST 10-8 MG/5ML PO LQCR: 5.0000 mL | Freq: Two times a day (BID) | ORAL | Status: DC | PRN

## 2013-10-16 MED ORDER — AZITHROMYCIN 250 MG PO TABS: ORAL_TABLET | ORAL | Status: DC

## 2013-10-16 MED ORDER — ALBUTEROL SULFATE HFA 108 (90 BASE) MCG/ACT IN AERS: 2.0000 | INHALATION_SPRAY | RESPIRATORY_TRACT | Status: DC | PRN

## 2013-10-16 NOTE — Progress Notes (Signed)
[facility] [address] [place] [address] [phone]  Date:  10/16/2013   Name:  Debbie [name]   DOB:  1970/07/02   MRN:  [id number]  PCP:  Mateo Flow, MD    Chief Complaint: Cough   History of Present Illness:  Debbie [name] is a 43 y.o. very pleasant female patient who presents with the following:  Tuesday became ill with cough and a fever.  Difficult to expectorate sputum. Has nasal congestion with mucopurulent sputum.  Watery eyes and itching eyes despite numerous OTC preparations.  Now cough is prominent.  Temp up to 101.2.  Patient has exertional shortness of breath.  No nausea or vomiting.  Missed several days of work due to fatigue.  No improvement with over the counter medications or other home remedies. Denies other complaint or health concern today.   Patient Active Problem List   Diagnosis Date Noted  . 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 Pain and swelling of right forearm   . Lupus 10/11/2012  . Kidney stones 10/11/2012  . Chronic headaches 10/11/2012    Past Medical History  Diagnosis Date  . Kidney stones   . Lupus   . Allergy   . Arthritis   . GERD (gastroesophageal reflux disease)   . Anemia   . Menopause   . Gastroparesis   . Anorexia nervosa with bulimia 2004  . Pain and swelling of right forearm     Past Surgical History  Procedure Laterality Date  . Abdominal hysterectomy      2x,2008-2009  . Cholecystectomy    . Carpal tunnel release Right   . Kidney stone surgery      5  . Ovarian cyst surgery  2007    LAP    History  Substance Use Topics  . Smoking status: Never Smoker   . Smokeless tobacco: Never Used  . Alcohol Use: Yes     Comment: rarely    Family History  Problem Relation Age of Onset  . Arthritis Mother   . Hernia Mother   . Cancer Mother     melanoma  . Hypertension Mother   . Diabetes Father   . Lupus Sister   . Diabetes Maternal Grandmother   . Cancer  Maternal Grandfather   . Diabetes Paternal Grandfather   . Heart disease Paternal Grandfather   . Heart disease Paternal Grandmother   . Diabetes Other     Allergies  Allergen Reactions  . Zofran [Ondansetron Hcl]     Stutters and looses control of her arm and legs    Medication list has been reviewed and updated.  Current Outpatient Prescriptions on File Prior to Visit  Medication Sig Dispense Refill  . dicyclomine (BENTYL) 20 MG tablet Take 20 mg by mouth every 6 (six) hours as needed (for IBS).       Marland Kitchen estradiol (ESTRACE) 1 MG tablet Take 1 mg by mouth daily.      . hydroxychloroquine (PLAQUENIL) 200 MG tablet Take 200 mg by mouth daily.      . polyethylene glycol (MIRALAX / GLYCOLAX) packet Take 17 g by mouth daily.      . Promethazine HCl (PHENERGAN PO) Take by mouth daily.       Current Facility-Administered Medications on File Prior to Visit  Medication Dose Route Frequency Provider Last Rate Last Dose  . methylPREDNISolone acetate (DEPO-MEDROL) injection 80 mg  80 mg Intramuscular Once Ellison Carwin, MD  Review of Systems:  .Francoise Schaumann   Physical Examination: Filed Vitals:   10/16/13 1010  BP: 102/74  Pulse: 97  Temp: 97.8 F (36.6 C)  Resp: 20   Filed Vitals:   10/16/13 1010  Height: 5\' 1"  (1.549 m)  Weight: 139 lb 12.8 oz (63.413 kg)   Body mass index is 26.43 kg/(m^2). Ideal Body Weight: Weight in (lb) to have BMI = 25: 132  GEN: WDWN, NAD, Non-toxic, A & O x 3  Prominent non productive cough HEENT: Atraumatic, Normocephalic. Neck supple. No masses, No LAD. Ears and Nose: No external deformity. CV: RRR, No M/G/R. No JVD. No thrill. No extra heart sounds. PULM: CTA B, no wheezes, crackles, rhonchi. No retractions. No resp. distress. No accessory muscle use. ABD: S, NT, ND, +BS. No rebound. No HSM. EXTR: No c/c/e NEURO Normal gait.  PSYCH: Normally interactive. Conversant. Not depressed or anxious appearing.  Calm demeanor.    Assessment and  Plan: Bronchitis Albuterol tussionex zpak  Signed,  Ellison Carwin, MD

## 2013-10-16 NOTE — Patient Instructions (Signed)
Metered Dose Inhaler (No Spacer Used) Inhaled medicines are the basis of asthma treatment and other breathing problems. Inhaled medicine can only be effective if used properly. Good technique assures that the medicine reaches the lungs. Metered dose inhalers (MDIs) are used to deliver a variety of inhaled medicines. These include quick relief or rescue medicines (such as bronchodilators) and controller medicines (such as corticosteroids). The medicine is delivered by pushing down on a metal canister to release a set amount of spray. If you are using different kinds of inhalers, use your quick relief medicine to open the airways 10 15 minutes before using a steroid if instructed to do so by your health care provider. If you are unsure which inhalers to use and the order of using them, ask your health care provider, nurse, or respiratory therapist. HOW TO USE THE INHALER 1. Remove cap from inhaler. 2. If you are using the inhaler for the first time, you will need to prime it. Shake the inhaler for 5 seconds and release four puffs into the air, away from your face. Ask your health care provider or pharmacist if you have questions about priming your inhaler. 3. Shake inhaler for 5 seconds before each breath in (inhalation). 4. Position the inhaler so that the top of the canister faces up. 5. Put your index finger on the top of the medicine canister. Your thumb supports the bottom of the inhaler. 6. Open your mouth. 7. Either place the inhaler between your teeth and place your lips tightly around the mouthpiece, or hold the inhaler 1 2 inches away from your open mouth. If you are unsure of which technique to use, ask your health care provider. 8. Breathe out (exhale) normally and as completely as possible. 9. Press the canister down with the index finger to release the medicine. 10. At the same time as the canister is pressed, inhale deeply and slowly until the lungs are completely filled. This should take  4 6 seconds. Keep your tongue down. 11. Hold the medicine in your lungs for up to 5 10 seconds (10 seconds is best). This helps the medicine get into the small airways of your lungs. 12. Breathe out slowly, through pursed lips. Whistling is an example of pursed lips. 13. Wait at least 1 minute between puffs. Continue with the above steps until you have taken the number of puffs your health care provider has ordered. Do not use the inhaler more than your health care provider directs you to. 14. Replace cap on inhaler. 15. Follow the directions from your health care provider or the inhaler insert for cleaning the inhaler. If you are using a steroid inhaler, rinse your mouth with water after your last puff, gargle, and spit out the water. Do not swallow the water. AVOID:  Inhaling before or after starting the spray of medicine. It takes practice to coordinate your breathing with triggering the spray.  Inhaling through the nose (rather than the mouth) when triggering the spray. HOW TO DETERMINE IF YOUR INHALER IS FULL OR NEARLY EMPTY You cannot know when an inhaler is empty by shaking it. A few inhalers are now being made with dose counters. Ask your health care provider for a prescription that has a dose counter if you feel you need that extra help. If your inhaler does not have a counter, ask your health care provider to help you determine the date you need to refill your inhaler. Write the refill date on a calendar or your inhaler canister.  Refill your inhaler 7 10 days before it runs out. Be sure to keep an adequate supply of medicine. This includes making sure it is not expired, and you have a spare inhaler.  SEEK MEDICAL CARE IF:   Symptoms are only partially relieved with your inhaler.  You are having trouble using your inhaler.  You experience some increase in phlegm. SEEK IMMEDIATE MEDICAL CARE IF:   You feel little or no relief with your inhalers. You are still wheezing and are feeling  shortness of breath or tightness in your chest or both.  You have dizziness, headaches, or fast heart rate.  You have chills, fever, or night sweats.  There is a noticeable increase in phlegm production, or there is blood in the phlegm. Document Released: 03/02/2007 Document Revised: 01/05/2013 Document Reviewed: 10/21/2012 [facility] Patient Information 2014 [place], [place].

## 2014-09-06 ENCOUNTER — Other Ambulatory Visit: Payer: Self-pay | Admitting: Gastroenterology

## 2015-02-19 ENCOUNTER — Ambulatory Visit (INDEPENDENT_AMBULATORY_CARE_PROVIDER_SITE_OTHER): Payer: BC Managed Care – PPO | Admitting: Neurology

## 2015-02-19 ENCOUNTER — Encounter: Payer: Self-pay | Admitting: Neurology

## 2015-02-19 VITALS — BP 120/86 | HR 74 | Resp 20 | Ht 61.5 in | Wt 136.0 lb

## 2015-02-19 DIAGNOSIS — J302 Other seasonal allergic rhinitis: Secondary | ICD-10-CM | POA: Diagnosis not present

## 2015-02-19 DIAGNOSIS — H832X3 Labyrinthine dysfunction, bilateral: Secondary | ICD-10-CM

## 2015-02-19 DIAGNOSIS — J209 Acute bronchitis, unspecified: Secondary | ICD-10-CM | POA: Diagnosis not present

## 2015-02-19 MED ORDER — DM-DOXYLAMINE-ACETAMINOPHEN 30-12.5-1000 MG/30ML PO LIQD: 6.0000 mL | Freq: Every evening | ORAL | Status: DC

## 2015-02-19 MED ORDER — FEXOFENADINE HCL 30 MG PO TABS: 30.0000 mg | ORAL_TABLET | Freq: Two times a day (BID) | ORAL | Status: DC

## 2015-02-19 NOTE — Patient Instructions (Signed)
Acute Bronchitis Bronchitis is when the airways that extend from the windpipe into the lungs get red, puffy, and painful (inflamed). Bronchitis often causes thick spit (mucus) to develop. This leads to a cough. A cough is the most common symptom of bronchitis. In acute bronchitis, the condition usually begins suddenly and goes away over time (usually in 2 weeks). Smoking, allergies, and asthma can make bronchitis worse. Repeated episodes of bronchitis may cause more lung problems. HOME CARE  Rest.  Drink enough fluids to keep your pee (urine) clear or pale yellow (unless you need to limit fluids as told by your doctor).  Only take over-the-counter or prescription medicines as told by your doctor.  Avoid smoking and secondhand smoke. These can make bronchitis worse. If you are a smoker, think about using nicotine gum or skin patches. Quitting smoking will help your lungs heal faster.  Reduce the chance of getting bronchitis again by:  Washing your hands often.  Avoiding people with cold symptoms.  Trying not to touch your hands to your mouth, nose, or eyes.  Follow up with your doctor as told. GET HELP IF: Your symptoms do not improve after 1 week of treatment. Symptoms include:  Cough.  Fever.  Coughing up thick spit.  Body aches.  Chest congestion.  Chills.  Shortness of breath.  Sore throat. GET HELP RIGHT AWAY IF:   You have an increased fever.  You have chills.  You have severe shortness of breath.  You have bloody thick spit (sputum).  You throw up (vomit) often.  You lose too much body fluid (dehydration).  You have a severe headache.  You faint. MAKE SURE YOU:   Understand these instructions.  Will watch your condition.  Will get help right away if you are not doing well or get worse. Document Released: 10/22/2007 Document Revised: 01/05/2013 Document Reviewed: 10/26/2012 [employer] Patient Information 2015 [employer]. This information is not  intended to replace advice given to you by your health care provider. Make sure you discuss any questions you have with your health care provider.  
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
## 2015-04-17 ENCOUNTER — Ambulatory Visit (INDEPENDENT_AMBULATORY_CARE_PROVIDER_SITE_OTHER): Payer: BC Managed Care – PPO

## 2015-04-17 ENCOUNTER — Ambulatory Visit (INDEPENDENT_AMBULATORY_CARE_PROVIDER_SITE_OTHER): Payer: BC Managed Care – PPO | Admitting: Internal Medicine

## 2015-04-17 VITALS — BP 106/74 | HR 113 | Temp 99.4°F | Resp 18 | Ht 61.0 in | Wt 138.0 lb

## 2015-04-17 DIAGNOSIS — R05 Cough: Secondary | ICD-10-CM | POA: Diagnosis not present

## 2015-04-17 DIAGNOSIS — R059 Cough, unspecified: Secondary | ICD-10-CM

## 2015-04-17 MED ORDER — AZITHROMYCIN 500 MG PO TABS: 500.0000 mg | ORAL_TABLET | Freq: Every day | ORAL | Status: DC

## 2015-04-17 MED ORDER — HYDROCOD POLST-CPM POLST ER 10-8 MG/5ML PO SUER: 5.0000 mL | Freq: Two times a day (BID) | ORAL | Status: DC | PRN

## 2015-04-17 MED ORDER — PREDNISONE 20 MG PO TABS: ORAL_TABLET | ORAL | Status: DC
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
## 2015-04-17 NOTE — Patient Instructions (Signed)
See if you got the Tdap vaccine

## 2015-04-18 ENCOUNTER — Telehealth: Payer: Self-pay

## 2015-04-18 NOTE — Telephone Encounter (Signed)
Pt is needing to talk with someone about her visit and that the vaccine for whooping cough was last in 2007 and needs to know what to tell her schoo   Best number [phone]

## 2015-04-18 NOTE — Telephone Encounter (Signed)
Yes--my dx is whooping cough but there is no test we can do to confirm And ok to give her note for the whole week if needed

## 2015-04-18 NOTE — Telephone Encounter (Signed)
Spoke with pt, she wants to know if she has whooping or not because she has to tell her work something. She had a TDAP in 2007. I advised pt that there is no way to tell if she was covered since it cuts close to 10 years. SHe also needs a note from work. Please advise.

## 2015-04-19 NOTE — Telephone Encounter (Signed)
Advised pt. Faxed note to employer.

## 2015-06-19 ENCOUNTER — Other Ambulatory Visit: Payer: Self-pay | Admitting: Urology

## 2015-06-20 ENCOUNTER — Encounter (HOSPITAL_COMMUNITY): Payer: Self-pay | Admitting: *Deleted

## 2015-06-22 ENCOUNTER — Ambulatory Visit (HOSPITAL_COMMUNITY): Payer: BC Managed Care – PPO | Admitting: Anesthesiology

## 2015-06-22 ENCOUNTER — Ambulatory Visit (HOSPITAL_COMMUNITY)
Admission: RE | Admit: 2015-06-22 | Discharge: 2015-06-22 | Disposition: A | Discharge status: Home / Self Care | Payer: BC Managed Care – PPO | Source: Ambulatory Visit | Attending: Urology | Admitting: Urology

## 2015-06-22 ENCOUNTER — Encounter (HOSPITAL_COMMUNITY): Payer: Self-pay | Admitting: *Deleted

## 2015-06-22 ENCOUNTER — Ambulatory Visit (HOSPITAL_COMMUNITY): Payer: BC Managed Care – PPO

## 2015-06-22 ENCOUNTER — Encounter (HOSPITAL_COMMUNITY)
Admission: RE | Disposition: A | Discharge status: Home / Self Care | Payer: Self-pay | Source: Ambulatory Visit | Attending: Urology

## 2015-06-22 DIAGNOSIS — E7800 Pure hypercholesterolemia, unspecified: Secondary | ICD-10-CM | POA: Insufficient documentation

## 2015-06-22 DIAGNOSIS — Z9049 Acquired absence of other specified parts of digestive tract: Secondary | ICD-10-CM | POA: Insufficient documentation

## 2015-06-22 DIAGNOSIS — Z90710 Acquired absence of both cervix and uterus: Secondary | ICD-10-CM | POA: Insufficient documentation

## 2015-06-22 DIAGNOSIS — N281 Cyst of kidney, acquired: Secondary | ICD-10-CM | POA: Diagnosis not present

## 2015-06-22 DIAGNOSIS — M329 Systemic lupus erythematosus, unspecified: Secondary | ICD-10-CM | POA: Insufficient documentation

## 2015-06-22 DIAGNOSIS — M1990 Unspecified osteoarthritis, unspecified site: Secondary | ICD-10-CM | POA: Insufficient documentation

## 2015-06-22 DIAGNOSIS — N201 Calculus of ureter: Secondary | ICD-10-CM | POA: Insufficient documentation

## 2015-06-22 DIAGNOSIS — Z87442 Personal history of urinary calculi: Secondary | ICD-10-CM | POA: Insufficient documentation

## 2015-06-22 DIAGNOSIS — F5000 Anorexia nervosa, unspecified: Secondary | ICD-10-CM | POA: Diagnosis not present

## 2015-06-22 DIAGNOSIS — K219 Gastro-esophageal reflux disease without esophagitis: Secondary | ICD-10-CM | POA: Diagnosis not present

## 2015-06-22 DIAGNOSIS — Z79899 Other long term (current) drug therapy: Secondary | ICD-10-CM | POA: Diagnosis not present

## 2015-06-22 HISTORY — PX: CYSTOSCOPY WITH RETROGRADE PYELOGRAM, URETEROSCOPY AND STENT PLACEMENT: SHX5789

## 2015-06-22 LAB — CBC
HEMATOCRIT: 36.7 % (ref 36.0–46.0)
Hemoglobin: 12.5 g/dL (ref 12.0–15.0)
MCH: 29.1 pg (ref 26.0–34.0)
MCHC: 34.1 g/dL (ref 30.0–36.0)
MCV: 85.3 fL (ref 78.0–100.0)
PLATELETS: 265 10*3/uL (ref 150–400)
RBC: 4.3 MIL/uL (ref 3.87–5.11)
RDW: 12.5 % (ref 11.5–15.5)
WBC: 6.3 10*3/uL (ref 4.0–10.5)

## 2015-06-22 SURGERY — CYSTOURETEROSCOPY, WITH RETROGRADE PYELOGRAM AND STENT INSERTION
Anesthesia: General | Site: Ureter | Laterality: Left

## 2015-06-22 MED ORDER — PROPOFOL 10 MG/ML IV BOLUS
INTRAVENOUS | Status: DC | PRN
  Administered 2015-06-22: 200 mg via INTRAVENOUS

## 2015-06-22 MED ORDER — MIDAZOLAM HCL 2 MG/2ML IJ SOLN
INTRAMUSCULAR | Status: AC
  Filled 2015-06-22: qty 2

## 2015-06-22 MED ORDER — DEXAMETHASONE SODIUM PHOSPHATE 10 MG/ML IJ SOLN
INTRAMUSCULAR | Status: AC
  Filled 2015-06-22: qty 1

## 2015-06-22 MED ORDER — HYDROCODONE-ACETAMINOPHEN 5-325 MG PO TABS: 1.0000 | ORAL_TABLET | ORAL | Status: DC | PRN

## 2015-06-22 MED ORDER — FENTANYL CITRATE (PF) 100 MCG/2ML IJ SOLN: 25.0000 ug | INTRAMUSCULAR | Status: DC | PRN

## 2015-06-22 MED ORDER — FENTANYL CITRATE (PF) 100 MCG/2ML IJ SOLN
INTRAMUSCULAR | Status: AC
  Filled 2015-06-22: qty 2

## 2015-06-22 MED ORDER — LACTATED RINGERS IV SOLN
INTRAVENOUS | Status: DC
  Administered 2015-06-22: 1000 mL via INTRAVENOUS

## 2015-06-22 MED ORDER — MIDAZOLAM HCL 5 MG/5ML IJ SOLN
INTRAMUSCULAR | Status: DC | PRN
  Administered 2015-06-22: 2 mg via INTRAVENOUS

## 2015-06-22 MED ORDER — PHENAZOPYRIDINE HCL 200 MG PO TABS
ORAL_TABLET | ORAL | Status: AC
  Filled 2015-06-22: qty 1

## 2015-06-22 MED ORDER — CIPROFLOXACIN IN D5W 200 MG/100ML IV SOLN
200.0000 mg | INTRAVENOUS | Status: AC
  Administered 2015-06-22: 200 mg via INTRAVENOUS
  Filled 2015-06-22: qty 100

## 2015-06-22 MED ORDER — LIDOCAINE HCL (CARDIAC) 20 MG/ML IV SOLN
INTRAVENOUS | Status: DC | PRN
  Administered 2015-06-22: 100 mg via INTRAVENOUS

## 2015-06-22 MED ORDER — IOHEXOL 300 MG/ML  SOLN
INTRAMUSCULAR | Status: DC | PRN
  Administered 2015-06-22: 5 mL

## 2015-06-22 MED ORDER — DEXAMETHASONE SODIUM PHOSPHATE 10 MG/ML IJ SOLN
INTRAMUSCULAR | Status: DC | PRN
  Administered 2015-06-22: 10 mg via INTRAVENOUS

## 2015-06-22 MED ORDER — PROPOFOL 10 MG/ML IV BOLUS
INTRAVENOUS | Status: AC
  Filled 2015-06-22: qty 20

## 2015-06-22 MED ORDER — PROMETHAZINE HCL 25 MG/ML IJ SOLN: 6.2500 mg | INTRAMUSCULAR | Status: DC | PRN

## 2015-06-22 MED ORDER — FENTANYL CITRATE (PF) 100 MCG/2ML IJ SOLN
INTRAMUSCULAR | Status: DC | PRN
  Administered 2015-06-22 (×2): 50 ug via INTRAVENOUS

## 2015-06-22 MED ORDER — LIDOCAINE HCL (CARDIAC) 20 MG/ML IV SOLN
INTRAVENOUS | Status: AC
  Filled 2015-06-22: qty 5

## 2015-06-22 MED ORDER — PHENAZOPYRIDINE HCL 200 MG PO TABS: 200.0000 mg | ORAL_TABLET | Freq: Three times a day (TID) | ORAL | Status: DC | PRN

## 2015-06-22 MED ORDER — SODIUM CHLORIDE 0.9 % IR SOLN
Status: DC | PRN
  Administered 2015-06-22: 1000 mL

## 2015-06-22 MED ORDER — PHENAZOPYRIDINE HCL 200 MG PO TABS: 200.0000 mg | ORAL_TABLET | Freq: Once | ORAL | Status: DC

## 2015-06-22 MED ORDER — HYDROCODONE-ACETAMINOPHEN 10-325 MG PO TABS: 1.0000 | ORAL_TABLET | ORAL | Status: DC | PRN

## 2015-06-22 MED ORDER — TAMSULOSIN HCL 0.4 MG PO CAPS
0.4000 mg | ORAL_CAPSULE | Freq: Once | ORAL | Status: AC
  Administered 2015-06-22: 0.4 mg via ORAL
  Filled 2015-06-22: qty 1

## 2015-06-22 SURGICAL SUPPLY — 21 items
BAG URO CATCHER STRL LF (MISCELLANEOUS) ×4 IMPLANT
BASKET DAKOTA 1.9FR 11X120 (BASKET) ×3 IMPLANT
BASKET ZERO TIP NITINOL 2.4FR (BASKET) IMPLANT
BSKT STON RTRVL ZERO TP 2.4FR (BASKET)
CATH INTERMIT  6FR 70CM (CATHETERS) ×4 IMPLANT
CLOTH BEACON ORANGE TIMEOUT ST (SAFETY) ×4 IMPLANT
FIBER LASER FLEXIVA 1000 (UROLOGICAL SUPPLIES) IMPLANT
FIBER LASER FLEXIVA 200 (UROLOGICAL SUPPLIES) IMPLANT
FIBER LASER FLEXIVA 365 (UROLOGICAL SUPPLIES) IMPLANT
FIBER LASER FLEXIVA 550 (UROLOGICAL SUPPLIES) IMPLANT
FIBER LASER TRAC TIP (UROLOGICAL SUPPLIES) IMPLANT
GLOVE BIOGEL M 8.0 STRL (GLOVE) ×4 IMPLANT
GOWN STRL REUS W/ TWL XL LVL3 (GOWN DISPOSABLE) ×2 IMPLANT
GOWN STRL REUS W/TWL XL LVL3 (GOWN DISPOSABLE) ×8 IMPLANT
GUIDEWIRE ANG ZIPWIRE 038X150 (WIRE) IMPLANT
GUIDEWIRE STR DUAL SENSOR (WIRE) ×3 IMPLANT
MANIFOLD NEPTUNE II (INSTRUMENTS) ×4 IMPLANT
PACK CYSTO (CUSTOM PROCEDURE TRAY) ×4 IMPLANT
SHEATH ACCESS URETERAL 24CM (SHEATH) ×3 IMPLANT
TUBING CONNECTING 10 (TUBING) ×3 IMPLANT
TUBING CONNECTING 10' (TUBING) ×1

## 2015-06-22 NOTE — Anesthesia Postprocedure Evaluation (Signed)
Anesthesia Post Note  Patient: Debbie [name]  Procedure(s) Performed: Procedure(s) (LRB): LEFT URETEROSCOPY AND STONE EXTRACTION  (Left)  Patient location during evaluation: PACU Anesthesia Type: General Level of consciousness: awake and alert Pain management: pain level controlled Vital Signs Assessment: post-procedure vital signs reviewed and stable Respiratory status: spontaneous breathing, nonlabored ventilation, respiratory function stable and patient connected to nasal cannula oxygen Cardiovascular status: blood pressure returned to baseline and stable Postop Assessment: no signs of nausea or vomiting Anesthetic complications: no    Last Vitals:  Filed Vitals:   06/22/15 1345 06/22/15 1424  BP: 129/77 119/70  Pulse: 62 63  Temp: 36.4 C   Resp: 18 18    Last Pain:  Filed Vitals:   06/22/15 1424  PainSc: 0-No pain                 Catalina Gravel

## 2015-06-22 NOTE — Anesthesia Procedure Notes (Signed)
Procedure Name: LMA Insertion Date/Time: 06/22/2015 12:35 PM Performed by: Riki Sheer Pre-anesthesia Checklist: Patient identified, Emergency Drugs available, Suction available, Patient being monitored and Timeout performed Patient Re-evaluated:Patient Re-evaluated prior to inductionOxygen Delivery Method: Circle system utilized Preoxygenation: Pre-oxygenation with 100% oxygen Intubation Type: IV induction Ventilation: Mask ventilation without difficulty LMA: LMA inserted LMA Size: 4.0 Number of attempts: 1 Placement Confirmation: positive ETCO2,  CO2 detector and breath sounds checked- equal and bilateral Tube secured with: Tape Dental Injury: Teeth and Oropharynx as per pre-operative assessment

## 2015-06-22 NOTE — Anesthesia Preprocedure Evaluation (Addendum)
Anesthesia Evaluation  Patient identified by MRN, date of birth, ID band Patient awake    Reviewed: Allergy & Precautions, NPO status , Patient's Chart, lab work & pertinent test results  History of Anesthesia Complications Negative for: history of anesthetic complications  Airway Mallampati: II  TM Distance: >3 FB Neck ROM: Full    Dental  (+) Teeth Intact, Dental Advisory Given   Pulmonary neg pulmonary ROS,    Pulmonary exam normal breath sounds clear to auscultation       Cardiovascular Exercise Tolerance: Good negative cardio ROS Normal cardiovascular exam Rhythm:Regular Rate:Normal     Neuro/Psych  Headaches, PSYCHIATRIC DISORDERS (Anorexia nervosa)    GI/Hepatic Neg liver ROS, GERD  Medicated and Controlled,  Endo/Other  Lupus  Renal/GU Renal disease (nephrolithiasis)     Musculoskeletal  (+) Arthritis , Osteoarthritis,    Abdominal   Peds  Hematology   Anesthesia Other Findings Day of surgery medications reviewed with the patient.  Reproductive/Obstetrics negative OB ROS                            Anesthesia Physical Anesthesia Plan  ASA: II  Anesthesia Plan: General   Post-op Pain Management:    Induction: Intravenous  Airway Management Planned: LMA  Additional Equipment:   Intra-op Plan:   Post-operative Plan: Extubation in OR  Informed Consent: I have reviewed the patients History and Physical, chart, labs and discussed the procedure including the risks, benefits and alternatives for the proposed anesthesia with the patient or authorized representative who has indicated his/her understanding and acceptance.   Dental advisory given  Plan Discussed with: CRNA  Anesthesia Plan Comments: (Risks/benefits of general anesthesia discussed with patient including risk of damage to teeth, lips, gum, and tongue, nausea/vomiting, allergic reactions to medications, and the  possibility of heart attack, stroke and death.  All patient questions answered.  Patient wishes to proceed.)       Anesthesia Quick Evaluation

## 2015-06-22 NOTE — Transfer of Care (Signed)
Immediate Anesthesia Transfer of Care Note  Patient: Debbie [name]  Procedure(s) Performed: Procedure(s): LEFT URETEROSCOPY AND STONE EXTRACTION  (Left)  Patient Location: PACU  Anesthesia Type:General  Level of Consciousness: awake and sedated  Airway & Oxygen Therapy: Patient Spontanous Breathing and Patient connected to face mask oxygen  Post-op Assessment: Report given to RN and Post -op Vital signs reviewed and stable  Post vital signs: Reviewed and stable  Last Vitals:  Filed Vitals:   06/22/15 1001  BP: 118/79  Pulse: 72  Temp: 36.8 C  Resp: 16    Complications: No apparent anesthesia complications

## 2015-06-22 NOTE — H&P (Signed)
Debbie [name] is a 45 year old female with a left ureteral stone.   History of Present Illness   Nephrolithiasis: A CT scan was obtained in 3/11 and revealed a 2 x 3 mm stone in the lower pole of her left kidney without any evidence of obstruction. No other renal calculi were identified. She has a history of stones that she has passed spontaneously. She said she passed 3 stones and then required ureteroscopic removal of the stone in 1/12 by Dr. Comer Locket.   A CT scan done in 4/13 revealed a single,small stone in the lower pole of her left kidney. A CT scan done on 04/05/12 also revealed the same small stone in the lower pole of the left kidney with no other abnormality noted.  CT scan 06/08/15 showed she had a 3 mm stone just above the ureterovesical junction on the left-hand side as well as a single small stone in the lower pole of her left kidney. No right renal calculi were noted.  Stone analysis: 90% calcium oxalate and 10% calcium phosphate.  Stone risk: Her serum studies revealed normal serum calcium, uric acid and PTH. Her 24-hour urine was remarkably normal with a volume of 2.73 L and no identified abnormality.    Reviewing previous CT scan images I know she has bilateral, simple renal cysts.     Interval history: She had a 3 mm left distal ureteral stone located just above the ureterovesical junction. She was maintained on medical expulsive therapy and I also gave her some Vesicare to help with the irritative symptoms caused by her distal ureteral stone. She continues to have a lot of irritative symptoms. She is not having a lot of pain. She has not seen her stone pass.    Past Medical History Problems  1. History of Arthritis 2. History of esophageal reflux (Z87.19) 3. History of hypercholesterolemia (Z86.39)  Surgical History Problems  1. History of Bladder Cystotomy With Basket Extraction Of Calculus 2. History of Cholecystectomy 3. History of Hysterectomy 4. History  of Laparoscopic Aspiration Ovaries  Current Meds 1. Dicyclomine HCl - 20 MG Oral Tablet;  Therapy: (Recorded:18Sep2014) to Recorded 2. Estradiol 0.5 MG Oral Tablet;  Therapy: (Recorded:05Dec2013) to Recorded 3. Estradiol 1 MG Oral Tablet;  Therapy: (Recorded:05Dec2013) to Recorded 4. Flomax 0.4 MG Oral Capsule;  Therapy: ([id number]) to Recorded 5. Glycopyrrolate 2 MG Oral Tablet;  Therapy: ([id number]) to Recorded 6. Hydroxychloroquine Sulfate 200 MG Oral Tablet;  Therapy: (Recorded:02Apr2013) to Recorded 7. Ketorolac 10 MG TABS;  Therapy: ([id number]) to Recorded 8. MiraLax Oral Powder;  Therapy: (Recorded:02Apr2013) to Recorded 9. Multi Vitamin/Minerals TABS;  Therapy: (Recorded:02Apr2013) to Recorded 10. OxyCODONE HCl - 5 MG Oral Tablet;   Therapy: (Recorded:23Jan2017) to Recorded 11. Phenergan 25 MG TABS;   Therapy: ([id number]) to Recorded 12. Rapaflo 8 MG Oral Capsule; 1 po q day;   Therapy: [id number] to (Last Rx:23Jan2017) Ordered 13. Vitamin B12 TABS;   Therapy: (Recorded:02Apr2013) to Recorded 14. Vitamin C TABS;   Therapy: (Recorded:18Sep2014) to Recorded 15. Vitamin D TABS;   Therapy: (Recorded:02Apr2013) to Recorded  Allergies Medication  1. Zofran TABS  Family History Problems  1. Family history of Death In The Family Father 2. Family history of Diabetes Mellitus 3. Family history of Family Health Status Children ___ Living Daughters   1 4. Family history of Family Health Status Children ___ Living Sons   1 5. Family history of Family Health Status Of Mother - Alive 6. Family history of Nephrolithiasis 7. No pertinent family  history : Mother  Social History Problems  1. Alcohol Use (History)   occasional use 2. Caffeine Use 3. Marital History - Currently Married 4. Never A Smoker 5. Occupation:   Pharmacist, hospital 6. Denied: History of Tobacco Use   Vitals Vital Signs  Height: 5 ft 1 in Weight: 135 lb  BMI  Calculated: 25.51 BSA Calculated: 1.6 Blood Pressure: 119 / 70 Heart Rate: 80   Review of Systems Genitourinary, constitutional, skin, eye, otolaryngeal, hematologic/lymphatic, cardiovascular, pulmonary, endocrine, musculoskeletal, gastrointestinal, neurological and psychiatric system(s) were reviewed and pertinent findings if present are noted.  Genitourinary: urinary frequency, dysuria, urinary hesitancy, urinary stream starts and stops and initiating urination requires straining.  Gastrointestinal: nausea, abdominal pain and heartburn.  Constitutional: feeling tired (fatigue).  Hematologic/Lymphatic: a tendency to easily bruise.  Endocrine: polydipsia.  Musculoskeletal: back pain and joint pain.  Neurological: headache.    Vitals Vital Signs  BMI Calculated: 25.49 BSA Calculated: 1.6 Height: 5 ft 1 in Weight: 135 lb  Blood Pressure: 112 / 78 Temperature: 98.9 F Heart Rate: 70  Physical Exam Constitutional: Well nourished and well developed . No acute distress.  ENT:. The ears and nose are normal in appearance.  Neck: The appearance of the neck is normal and no neck mass is present.  Pulmonary: No respiratory distress and normal respiratory rhythm and effort.  Cardiovascular: Heart rate and rhythm are normal . No peripheral edema.  Abdomen: The abdomen is soft and nontender. No masses are palpated. No CVA tenderness. No hernias are palpable. No hepatosplenomegaly noted.  Lymphatics: The femoral and inguinal nodes are not enlarged or tender.  Skin: Normal skin turgor, no visible rash and no visible skin lesions.  Neuro/Psych:. Mood and affect are appropriate.     Assessment Ureteral calculus, left   We discussed the management of urinary stones. These options include observation, ureteroscopy, shockwave lithotripsy, and PCNL. We discussed which options are relevant to these particular stones. We discussed the natural history of stones as well as the complications of  untreated stones and the impact on quality of life without treatment as well as with each of the above listed treatments. We also discussed the efficacy of each treatment in its ability to clear the stone burden. With any of these management options I discussed the signs and symptoms of infection and the need for emergent treatment should these be experienced. For each option we discussed the ability of each procedure to clear the patient of their stone burden.    For observation I described the risks which include but are not limited to silent renal damage, life-threatening infection, need for emergent surgery, failure to pass stone, and pain.    For ureteroscopy I described the risks which include heart attack, stroke, pulmonary embolus, death, bleeding, infection, damage to contiguous structures, positioning injury, ureteral stricture, ureteral avulsion, ureteral injury, need for ureteral stent, inability to perform ureteroscopy, need for an interval procedure, inability to clear stone burden, stent discomfort and pain.    For shockwave lithotripsy I described the risks which include arrhythmia, kidney contusion, kidney hemorrhage, need for transfusion, long-term risk of diabetes or hypertension, back discomfort, flank ecchymosis, flank abrasion, inability to break up stone, inability to pass stone fragments, Steinstrasse, infection associated with obstructing stones, need for different surgical procedure and possible need for repeat shockwave lithotripsy.    Since she remains symptomatic from an irritative standpoint and the stone does not appear to have progressed significantly we discussed proceeding with ureteroscopy. She wanted to proceed with  the form of therapy with the greatest probability of rendering her stone free.   Plan  She will be scheduled for ureteroscopy and laser lithotripsy of her left distal ureteral stone.  She has had a stent in the past and this has caused irritation  so I will use a smaller stent is possible.

## 2015-06-22 NOTE — Discharge Instructions (Signed)
Cystoscopy patient instructions ° °Following a cystoscopy, a catheter (a flexible rubber tube) is sometimes left in place to empty the bladder. This may cause some discomfort or a feeling that you need to urinate. Your doctor determines the period of time that the catheter will be left in place. °You may have bloody urine for two to three days (Call your doctor if the amount of bleeding increases or does not subside). ° °You may pass blood clots in your urine, especially if you had a biopsy. It is not unusual to pass small blood clots and have some bloody urine a couple of weeks after your cystoscopy. Again, call your doctor if the bleeding does not subside. °You may have: °Dysuria (painful urination) °Frequency (urinating often) °Urgency (strong desire to urinate) ° °These symptoms are common especially if medicine is instilled into the bladder or a ureteral stent is placed. Avoiding alcohol and caffeine, such as coffee, tea, and chocolate, may help relieve these symptoms. Drink plenty of water, unless otherwise instructed. Your doctor may also prescribe an antibiotic or other medicine to reduce these symptoms. ° °Cystoscopy results are available soon after the procedure; biopsy results usually take two to four days. Your doctor will discuss the results of your exam with you. Before you go home, you will be given specific instructions for follow-up care. °Special Instructions: ° °·  If you are going home with a catheter in place do not take a tub bath until removed by your doctor.  °·  You may resume your normal activities.  °·  Do not drive or operate machinery if you are taking narcotic pain medicine.  °·  Be sure to keep all follow-up appointments with your doctor.  ° °· Call Your Doctor If: The catheter is not draining °· You have severe pain °· You are unable to urinate °· You have a fever over 101 °· You have severe bleeding °        ° ° ° °General  Anesthesia, Adult, Care After °Refer to this sheet in the next few weeks. These instructions provide you with information on caring for yourself after your procedure. Your health care provider may also give you more specific instructions. Your treatment has been planned according to current medical practices, but problems sometimes occur. Call your health care provider if you have any problems or questions after your procedure. °WHAT TO EXPECT AFTER THE PROCEDURE °After the procedure, it is typical to experience: °· Sleepiness. °· Nausea and vomiting. °HOME CARE INSTRUCTIONS °· For the first 24 hours after general anesthesia: °¨ Have a responsible person with you. °¨ Do not drive a car. If you are alone, do not take public transportation. °¨ Do not drink alcohol. °¨ Do not take medicine that has not been prescribed by your health care provider. °¨ Do not sign important papers or make important decisions. °¨ You may resume a normal diet and activities as directed by your health care provider. °· Change bandages (dressings) as directed. °· If you have questions or problems that seem related to general anesthesia, call the hospital and ask for the anesthetist or anesthesiologist on call. °SEEK MEDICAL CARE IF: °· You have nausea and vomiting that continue the day after anesthesia. °· You develop a rash. °SEEK IMMEDIATE MEDICAL CARE IF:  °· You have difficulty breathing. °· You have chest pain. °· You have any allergic problems. °  °This information is not intended to replace advice given to you by your health care provider. Make sure you discuss any   questions you have with your health care provider. °  °Document Released: 08/11/2000 Document Revised: 05/26/2014 Document Reviewed: 09/03/2011 °Elsevier Interactive Patient Education ©2016 [employer] ° °

## 2015-06-22 NOTE — Op Note (Signed)
PATIENT:  Debbie [name]  PRE-OPERATIVE DIAGNOSIS:  left Ureteral calculus  POST-OPERATIVE DIAGNOSIS: Same  PROCEDURE:  1. Cystoscopy with left retrograde pyelogram including interpretation. 2. Fluoroscopy use less than 1 hour 3. Left ureteroscopy and stone extraction  SURGEON: Claybon Jabs, MD  INDICATION: [name] is a 45 year old female with a left distal ureteral stone. She has been maintained on medical expulsive therapy however her stone has failed to progress. She has been symptomatic. She has elected to proceed with ureteroscopic management.  ANESTHESIA:  General  EBL:  Minimal  DRAINS: None  SPECIMEN:  Stone given to patient  DESCRIPTION OF PROCEDURE: The patient was taken to the major OR and placed on the table. General anesthesia was administered and then the patient was moved to the dorsal lithotomy position. The genitalia was sterilely prepped and draped. An official timeout was performed.  Initially the 30 French cystoscope with 30 lens was passed under direct vision into the bladder. The bladder was then fully inspected. It was noted be free of any tumors, stones or inflammatory lesions. Ureteral orifices were of normal configuration and position. A 6 French open-ended ureteral catheter was then passed through the cystoscope into the ureteral orifice in order to perform a left retrograde pyelogram.  A retrograde pyelogram was performed by injecting full-strength contrast up the left ureter under direct fluoroscopic control. It revealed a filling defect in the distal ureter consistent with the stone seen on the preoperative KUB. The remainder of the ureter was noted to be normal as was the intrarenal collecting system. I then passed a 0.038 inch floppy-tipped guidewire through the open ended catheter and into the area of the renal pelvis and this was left in place. The inner portion of a ureteral access sheath was then passed over the guidewire to gently dilate the  intramural ureter. I then proceeded with ureteroscopy.  A 4.6 French rigid ureteroscope was then passed under direct into the bladder and into the left orifice and up the ureter. The stone was identified and was oblong in shape. I therefore used the [place] basket to engage the stone with its long axis parallel to the ureter. I was therefore easily able to extract the stone. Due to the ease of stone retrieval I did not feel a stent was indicated. The bladder was drained and the cystoscope sheath was then used to drain the bladder and then removed. The patient tolerated the procedure well no intraoperative complications.  PLAN OF CARE: Discharge to home after PACU  PATIENT DISPOSITION:  PACU - hemodynamically stable.

## 2016-01-30 ENCOUNTER — Other Ambulatory Visit: Payer: Self-pay | Admitting: Obstetrics and Gynecology

## 2016-01-30 DIAGNOSIS — R928 Other abnormal and inconclusive findings on diagnostic imaging of breast: Secondary | ICD-10-CM

## 2016-01-31 ENCOUNTER — Other Ambulatory Visit: Payer: Self-pay | Admitting: Gastroenterology

## 2016-01-31 DIAGNOSIS — R7989 Other specified abnormal findings of blood chemistry: Secondary | ICD-10-CM

## 2016-01-31 DIAGNOSIS — R945 Abnormal results of liver function studies: Principal | ICD-10-CM

## 2016-02-06 ENCOUNTER — Ambulatory Visit
Admission: RE | Admit: 2016-02-06 | Discharge: 2016-02-06 | Disposition: A | Discharge status: Home / Self Care | Payer: BC Managed Care – PPO | Source: Ambulatory Visit | Attending: Obstetrics and Gynecology | Admitting: Obstetrics and Gynecology

## 2016-02-06 DIAGNOSIS — R928 Other abnormal and inconclusive findings on diagnostic imaging of breast: Secondary | ICD-10-CM

## 2016-02-12 ENCOUNTER — Ambulatory Visit
Admission: RE | Admit: 2016-02-12 | Discharge: 2016-02-12 | Disposition: A | Discharge status: Home / Self Care | Payer: BC Managed Care – PPO | Source: Ambulatory Visit | Attending: Gastroenterology | Admitting: Gastroenterology

## 2016-02-12 DIAGNOSIS — R945 Abnormal results of liver function studies: Principal | ICD-10-CM

## 2016-02-12 DIAGNOSIS — R7989 Other specified abnormal findings of blood chemistry: Secondary | ICD-10-CM

## 2016-03-02 ENCOUNTER — Emergency Department (HOSPITAL_COMMUNITY)
Admission: EM | Admit: 2016-03-02 | Discharge: 2016-03-02 | Disposition: A | Discharge status: Home / Self Care | Payer: BC Managed Care – PPO | Attending: Emergency Medicine | Admitting: Emergency Medicine

## 2016-03-02 ENCOUNTER — Encounter (HOSPITAL_COMMUNITY): Payer: Self-pay | Admitting: Emergency Medicine

## 2016-03-02 ENCOUNTER — Emergency Department (HOSPITAL_COMMUNITY): Payer: BC Managed Care – PPO

## 2016-03-02 DIAGNOSIS — R109 Unspecified abdominal pain: Secondary | ICD-10-CM

## 2016-03-02 DIAGNOSIS — R1110 Vomiting, unspecified: Secondary | ICD-10-CM | POA: Insufficient documentation

## 2016-03-02 DIAGNOSIS — R197 Diarrhea, unspecified: Secondary | ICD-10-CM | POA: Insufficient documentation

## 2016-03-02 DIAGNOSIS — R300 Dysuria: Secondary | ICD-10-CM | POA: Insufficient documentation

## 2016-03-02 DIAGNOSIS — R1030 Lower abdominal pain, unspecified: Secondary | ICD-10-CM | POA: Diagnosis present

## 2016-03-02 LAB — CBC
HEMATOCRIT: 35.9 % — AB (ref 36.0–46.0)
HEMOGLOBIN: 12.3 g/dL (ref 12.0–15.0)
MCH: 28.8 pg (ref 26.0–34.0)
MCHC: 34.3 g/dL (ref 30.0–36.0)
MCV: 84.1 fL (ref 78.0–100.0)
PLATELETS: 311 10*3/uL (ref 150–400)
RBC: 4.27 MIL/uL (ref 3.87–5.11)
RDW: 12.8 % (ref 11.5–15.5)
WBC: 7.6 10*3/uL (ref 4.0–10.5)

## 2016-03-02 LAB — URINALYSIS, ROUTINE W REFLEX MICROSCOPIC
Bilirubin Urine: NEGATIVE
GLUCOSE, UA: NEGATIVE mg/dL
Hgb urine dipstick: NEGATIVE
KETONES UR: NEGATIVE mg/dL
LEUKOCYTES UA: NEGATIVE
NITRITE: NEGATIVE
PROTEIN: NEGATIVE mg/dL
Specific Gravity, Urine: 1.002 — ABNORMAL LOW (ref 1.005–1.030)
pH: 7 (ref 5.0–8.0)

## 2016-03-02 LAB — COMPREHENSIVE METABOLIC PANEL
ALBUMIN: 3.9 g/dL (ref 3.5–5.0)
ALK PHOS: 77 U/L (ref 38–126)
ALT: 30 U/L (ref 14–54)
ANION GAP: 5 (ref 5–15)
AST: 27 U/L (ref 15–41)
BUN: <5 mg/dL — ABNORMAL LOW (ref 6–20)
CALCIUM: 9.6 mg/dL (ref 8.9–10.3)
CHLORIDE: 110 mmol/L (ref 101–111)
CO2: 24 mmol/L (ref 22–32)
Creatinine, Ser: 0.58 mg/dL (ref 0.44–1.00)
GFR calc non Af Amer: >60 mL/min (ref >60)
GLUCOSE: 98 mg/dL (ref 65–99)
POTASSIUM: 3.3 mmol/L — AB (ref 3.5–5.1)
SODIUM: 139 mmol/L (ref 135–145)
Total Bilirubin: 0.6 mg/dL (ref 0.3–1.2)
Total Protein: 6.7 g/dL (ref 6.5–8.1)

## 2016-03-02 LAB — LIPASE, BLOOD: LIPASE: 24 U/L (ref 11–51)

## 2016-03-02 MED ORDER — SODIUM CHLORIDE 0.9 % IV BOLUS (SEPSIS): 1000.0000 mL | Freq: Once | INTRAVENOUS | Status: DC

## 2016-03-02 MED ORDER — METOCLOPRAMIDE HCL 5 MG/ML IJ SOLN
10.0000 mg | Freq: Once | INTRAMUSCULAR | Status: AC
  Administered 2016-03-02: 10 mg via INTRAVENOUS

## 2016-03-02 MED ORDER — METOCLOPRAMIDE HCL 5 MG/ML IJ SOLN
10.0000 mg | Freq: Once | INTRAMUSCULAR | Status: DC
  Filled 2016-03-02: qty 2

## 2016-03-02 MED ORDER — IOPAMIDOL (ISOVUE-300) INJECTION 61%
INTRAVENOUS | Status: AC
  Filled 2016-03-02: qty 100

## 2016-03-02 MED ORDER — DIPHENHYDRAMINE HCL 50 MG/ML IJ SOLN
25.0000 mg | Freq: Once | INTRAMUSCULAR | Status: AC
  Administered 2016-03-02: 25 mg via INTRAVENOUS
  Filled 2016-03-02: qty 1

## 2016-03-02 MED ORDER — POTASSIUM CHLORIDE CRYS ER 20 MEQ PO TBCR
20.0000 meq | EXTENDED_RELEASE_TABLET | Freq: Once | ORAL | Status: AC
  Administered 2016-03-02: 20 meq via ORAL
  Filled 2016-03-02: qty 1

## 2016-03-02 MED ORDER — IOPAMIDOL (ISOVUE-300) INJECTION 61%
100.0000 mL | Freq: Once | INTRAVENOUS | Status: AC | PRN
  Administered 2016-03-02: 100 mL via INTRAVENOUS

## 2016-03-02 MED ORDER — HYDROMORPHONE HCL 1 MG/ML IJ SOLN
1.0000 mg | Freq: Once | INTRAMUSCULAR | Status: AC
  Administered 2016-03-02: 1 mg via INTRAVENOUS
  Filled 2016-03-02: qty 1

## 2016-03-02 MED ORDER — SODIUM CHLORIDE 0.9 % IV BOLUS (SEPSIS)
1000.0000 mL | Freq: Once | INTRAVENOUS | Status: AC
  Administered 2016-03-02: 1000 mL via INTRAVENOUS

## 2016-03-02 NOTE — ED Provider Notes (Signed)
[facility] DEPT Provider Note   CSN: [id number] Arrival date & time: 03/02/16  0022     History   Chief Complaint Chief Complaint  Patient presents with  . Abdominal Pain    HPI Debbie [name] is a 45 y.o. female.  HPI   Pt with hx lupus, gastroparesis, presents with abdominal pain, nausea, diarrhea, dysuria.  States she has had one month of left sided abdominal pain with vomiting 5-6 times daily and frequent diarrhea.  Diarrhea occurs every time she eats.  Last night she developed lower abdominal pain and urinary frequency.  Has had 13 pound weight loss over the past month.  Follows with Dr Oletta Lamas, GI, who believes this is related to her gastroparesis.  Denies fevers, chills, vaginal complaints, blood in emesis or stool.   Hx abdominal surgeries: cholecystectomy, total hysterectomy, kidney stone procedures.    Past Medical History:  Diagnosis Date  . Allergy   . Anemia   . Anorexia nervosa with bulimia 2004  . Arthritis   . Gastroparesis   . GERD (gastroesophageal reflux disease)   . Kidney stones   . Lupus   . Menopause   . Pain and swelling of right forearm     Patient Active Problem List   Diagnosis Date Noted  . 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 Pain and swelling of right forearm   . Lupus 10/11/2012  . Kidney stones 10/11/2012  . Chronic headaches 10/11/2012    Past Surgical History:  Procedure Laterality Date  . ABDOMINAL HYSTERECTOMY     2x,2008-2009  . CARPAL TUNNEL RELEASE Right   . CHOLECYSTECTOMY    . CYSTOSCOPY WITH RETROGRADE PYELOGRAM, URETEROSCOPY AND STENT PLACEMENT Left 06/22/2015   Procedure: LEFT URETEROSCOPY AND STONE EXTRACTION ;  Surgeon: Kathie Rhodes, MD;  Location: [facility];  Service: Urology;  Laterality: Left;  . KIDNEY STONE SURGERY     5  . OVARIAN CYST SURGERY  2007   LAP    OB History    No data available       Home Medications    Prior to Admission medications   Medication Sig Start Date End  Date Taking? Authorizing Provider  azelastine (OPTIVAR) 0.05 % ophthalmic solution Place 2 drops into both eyes 2 (two) times daily. 02/15/16  Yes Historical Provider, MD  BIOTIN PO Take 1 capsule by mouth daily.   Yes Historical Provider, MD  cetirizine (ZYRTEC) 10 MG tablet Take 10 mg by mouth daily as needed for allergies.    Yes Historical Provider, MD  Cholecalciferol (VITAMIN D3 PO) Take 4 capsules by mouth daily.   Yes Historical Provider, MD  estradiol (ESTRACE) 0.5 MG tablet Take 0.5 mg by mouth daily.   Yes Historical Provider, MD  estradiol (ESTRACE) 1 MG tablet Take 1 mg by mouth daily.   Yes Historical Provider, MD  glycopyrrolate (ROBINUL) 2 MG tablet Take 2 mg by mouth 3 (three) times daily as needed (gastro cardisis).    Yes Historical Provider, MD  hydroxychloroquine (PLAQUENIL) 200 MG tablet Take 300 mg by mouth daily. Last dose 06/14/15   Yes Historical Provider, MD  omeprazole (PRILOSEC) 20 MG capsule Take 20 mg by mouth daily as needed (for acid reflux).    Yes Historical Provider, MD  Polyethyl Glycol-Propyl Glycol 0.4-0.3 % SOLN Place 1 drop into both eyes 2 (two) times daily as needed (dry/ irritated eyes).    Yes Historical Provider, MD  dicyclomine (BENTYL) 20 MG tablet Take 20 mg by mouth  every 6 (six) hours as needed (for IBS).     Historical Provider, MD  HYDROcodone-acetaminophen (NORCO) 10-325 MG tablet Take 1-2 tablets by mouth every 4 (four) hours as needed for moderate pain. Maximum dose per 24 hours - 8 pills Patient not taking: Reported on 03/02/2016 06/22/15   Kathie Rhodes, MD  phenazopyridine (PYRIDIUM) 200 MG tablet Take 1 tablet (200 mg total) by mouth 3 (three) times daily as needed for pain. Patient not taking: Reported on 03/02/2016 06/22/15   Kathie Rhodes, MD  promethazine (PHENERGAN) 25 MG tablet Take 25 mg by mouth every 6 (six) hours as needed for nausea or vomiting.    Historical Provider, MD    Family History Family History  Problem Relation Age of  Onset  . Arthritis Mother   . Hernia Mother   . Cancer Mother     melanoma  . Hypertension Mother   . Diabetes Father   . Lupus Sister   . Diabetes Maternal Grandmother   . Cancer Maternal Grandfather   . Diabetes Paternal Grandfather   . Heart disease Paternal Grandfather   . Heart disease Paternal Grandmother   . Diabetes Other     Social History Social History  Substance Use Topics  . Smoking status: Never Smoker  . Smokeless tobacco: Never Used  . Alcohol use Yes     Comment: rarely     Allergies   Zofran [ondansetron hcl]   Review of Systems Review of Systems  All other systems reviewed and are negative.    Physical Exam Updated Vital Signs BP 103/80   Pulse (!) 58   Temp 98.3 F (36.8 C) (Oral)   Resp 16   Ht 5\' 1"  (1.549 m)   Wt 62.6 kg   SpO2 100%   BMI 26.07 kg/m   Physical Exam  Constitutional: She appears well-developed and well-nourished. No distress.  HENT:  Head: Normocephalic and atraumatic.  Neck: Neck supple.  Cardiovascular: Normal rate and regular rhythm.   Pulmonary/Chest: Effort normal and breath sounds normal. No respiratory distress. She has no wheezes. She has no rales.  Abdominal: Soft. Bowel sounds are normal. She exhibits no distension. There is tenderness in the suprapubic area and left lower quadrant. There is no rebound and no guarding.  Neurological: She is alert.  Skin: She is not diaphoretic.  Nursing note and vitals reviewed.    ED Treatments / Results  Labs (all labs ordered are listed, but only abnormal results are displayed) Labs Reviewed  COMPREHENSIVE METABOLIC PANEL - Abnormal; Notable for the following:       Result Value   Potassium 3.3 (*)    BUN <5 (*)    All other components within normal limits  CBC - Abnormal; Notable for the following:    HCT 35.9 (*)    All other components within normal limits  URINALYSIS, ROUTINE W REFLEX MICROSCOPIC (NOT AT [facility]) - Abnormal; Notable for the following:     Specific Gravity, Urine 1.002 (*)    All other components within normal limits  LIPASE, BLOOD    EKG  EKG Interpretation None       Radiology Ct Abdomen Pelvis W Contrast  Result Date: 03/02/2016 CLINICAL DATA:  Pt with hx of left sided abd pain, vomiting, diarrhea,dysuria, urinary frequency Hx of kidney stones, with surgical removal of one. Hx of hysterectomy, EXAM: CT ABDOMEN AND PELVIS WITH CONTRAST TECHNIQUE: Multidetector CT imaging of the abdomen and pelvis was performed using the standard protocol following bolus  administration of intravenous contrast. CONTRAST:  117mL ISOVUE-300 IOPAMIDOL (ISOVUE-300) INJECTION 61% COMPARISON:  CT abdomen dated 06/08/2015. FINDINGS: Lower chest: No acute abnormality. Hepatobiliary: No focal liver abnormality is seen. Status post cholecystectomy. No biliary dilatation. Pancreas: Unremarkable. No pancreatic ductal dilatation or surrounding inflammatory changes. Spleen: Normal in size without focal abnormality. Adrenals/Urinary Tract: Adrenal glands appear normal. 3 mm nonobstructing stone in the lower pole left renal pelvis. No hydronephrosis bilaterally. No ureteral or bladder calculi identified bilaterally. Bladder appears normal, partially decompressed. Small left renal cysts. Stomach/Bowel: Bowel is normal in caliber. No bowel wall thickening or evidence of bowel wall inflammation. Stomach is normal. Appendix is upper normal in size measuring 6 mm diameter, similar in size compared to earlier CTs. No periappendiceal inflammation or free fluid. Vascular/Lymphatic: No significant vascular findings are present. No enlarged abdominal or pelvic lymph nodes. Reproductive: Status post hysterectomy. Adnexal regions are unremarkable. Other: Trace free fluid in the lower pelvis is likely physiologic in nature. No other free fluid. No abscess collection. No free intraperitoneal air. Musculoskeletal: Mild degenerative change in the lower lumbar spine. No acute or  suspicious osseous finding. IMPRESSION: 1. Overall, no acute findings within the abdomen or pelvis. 2. 3 mm nonobstructing left renal stone. No hydronephrosis bilaterally. No ureteral or bladder calculi identified. No evidence of pyelonephritis. Bladder appears normal. 3. Additional chronic/incidental findings detailed above. Electronically Signed   By: Franki Cabot M.D.   On: 03/02/2016 09:21    Procedures Procedures (including critical care time)  Medications Ordered in ED Medications  iopamidol (ISOVUE-300) 61 % injection (not administered)  sodium chloride 0.9 % bolus 1,000 mL (0 mLs Intravenous Stopped 03/02/16 1011)  HYDROmorphone (DILAUDID) injection 1 mg (1 mg Intravenous Given 03/02/16 0634)  metoCLOPramide (REGLAN) injection 10 mg (10 mg Intravenous Given 03/02/16 [id number])  diphenhydrAMINE (BENADRYL) injection 25 mg (25 mg Intravenous Given 03/02/16 0740)  iopamidol (ISOVUE-300) 61 % injection 100 mL (100 mLs Intravenous Contrast Given 03/02/16 0850)  potassium chloride SA (K-DUR,KLOR-CON) CR tablet 20 mEq (20 mEq Oral Given 03/02/16 0941)     Initial Impression / Assessment and Plan / ED Course  I have reviewed the triage vital signs and the nursing notes.  Pertinent labs & imaging results that were available during my care of the patient were reviewed by me and considered in my medical decision making (see chart for details).  Clinical Course    Afebrile nontoxic patient with abdominal pain, dysuria, and one month of vomiting and diarrhea.  Labs and UA unremarkable.  CT abd/pelvis without acute findings.  D/C home with GI follow up.  Pt has symptomatic medications at home.  UA sent for culture.  Discussed return precautions.  Discussed result, findings, treatment, and follow up  with patient.  Pt given return precautions.  Pt verbalizes understanding and agrees with plan.      Final Clinical Impressions(s) / ED Diagnoses   Final diagnoses:  Abdominal pain, unspecified  abdominal location  Vomiting and diarrhea  Dysuria    New Prescriptions Discharge Medication List as of 03/02/2016  9:36 AM       Clayton Bibles, PA-C 03/02/16 [place], DO 03/02/16 [id number]

## 2016-03-02 NOTE — ED Notes (Signed)
Kim in [place] made aware patient is ready to come over for scan

## 2016-03-02 NOTE — ED Triage Notes (Signed)
Pt. reports hypogastric pain with nausea , dysuria and occasional diarrhea onset this week , reports history of gastroparesis , denies fever or chills.

## 2016-03-02 NOTE — ED Notes (Signed)
Upon entering the room, patient appeared very anxious, tearful and jittery, pt reports symptoms began after she received reglan. Pt reassured that this is a common side effect of medication and that this RN would let the provider know. PA [place] made aware.

## 2016-03-02 NOTE — Discharge Instructions (Signed)
Read the information below.  You may return to the Emergency Department at any time for worsening condition or any new symptoms that concern you.  If you develop high fevers, worsening abdominal pain, uncontrolled vomiting, or are unable to tolerate fluids by mouth, return to the ER for a recheck.   °

## 2016-08-04 ENCOUNTER — Telehealth: Payer: Self-pay | Admitting: Neurology

## 2016-08-04 NOTE — Telephone Encounter (Signed)
I called pt. I offered her a sooner appt for tomorrow at 1:30pm. I asked pt to arrive for at 1:15pm appt. Pt verbalized understanding of this new appt date and time.

## 2016-08-04 NOTE — Telephone Encounter (Signed)
Pt called said she is having episodes of dizziness, out of breath, stuttering, hands tingling, constant rt arm numb and shaking of the hands and arms increasing in frequency since last Thursday (3/15). Said she is unable to drive now. Can she be seen sooner?

## 2016-08-05 ENCOUNTER — Other Ambulatory Visit: Payer: Self-pay | Admitting: Radiology

## 2016-08-05 ENCOUNTER — Encounter: Payer: Self-pay | Admitting: Neurology

## 2016-08-05 ENCOUNTER — Ambulatory Visit (INDEPENDENT_AMBULATORY_CARE_PROVIDER_SITE_OTHER): Payer: BC Managed Care – PPO | Admitting: Neurology

## 2016-08-05 VITALS — BP 108/76 | HR 85 | Ht 61.5 in | Wt 119.0 lb

## 2016-08-05 DIAGNOSIS — Z79899 Other long term (current) drug therapy: Secondary | ICD-10-CM

## 2016-08-05 DIAGNOSIS — Z87898 Personal history of other specified conditions: Secondary | ICD-10-CM

## 2016-08-05 DIAGNOSIS — G450 Vertebro-basilar artery syndrome: Secondary | ICD-10-CM

## 2016-08-05 LAB — CBC WITH DIFFERENTIAL/PLATELET
BASOS PCT: 0 %
Basophils Absolute: 0 cells/uL (ref 0–200)
EOS PCT: 1 %
Eosinophils Absolute: 126 cells/uL (ref 15–500)
HCT: 37.5 % (ref 35.0–45.0)
Hemoglobin: 13 g/dL (ref 11.7–15.5)
Lymphocytes Relative: 18 %
Lymphs Abs: 2268 cells/uL (ref 850–3900)
MCH: 30 pg (ref 27.0–33.0)
MCHC: 34.7 g/dL (ref 32.0–36.0)
MCV: 86.6 fL (ref 80.0–100.0)
MONOS PCT: 3 %
MPV: 8.6 fL (ref 7.5–12.5)
Monocytes Absolute: 378 cells/uL (ref 200–950)
Neutro Abs: 9828 cells/uL — ABNORMAL HIGH (ref 1500–7800)
Neutrophils Relative %: 78 %
PLATELETS: 295 10*3/uL (ref 140–400)
RBC: 4.33 MIL/uL (ref 3.80–5.10)
RDW: 14 % (ref 11.0–15.0)
WBC: 12.6 10*3/uL — AB (ref 3.8–10.8)

## 2016-08-05 LAB — COMPLETE METABOLIC PANEL WITH GFR
ALT: 77 U/L — AB (ref 6–29)
AST: 40 U/L — ABNORMAL HIGH (ref 10–30)
Albumin: 4.3 g/dL (ref 3.6–5.1)
Alkaline Phosphatase: 155 U/L — ABNORMAL HIGH (ref 33–115)
BUN: 15 mg/dL (ref 7–25)
CHLORIDE: 104 mmol/L (ref 98–110)
CO2: 24 mmol/L (ref 20–31)
CREATININE: 0.64 mg/dL (ref 0.50–1.10)
Calcium: 9.6 mg/dL (ref 8.6–10.2)
GFR, Est African American: >89 mL/min (ref >=60)
GFR, Est Non African American: >89 mL/min (ref >=60)
GLUCOSE: 81 mg/dL (ref 65–99)
Potassium: 4.5 mmol/L (ref 3.5–5.3)
SODIUM: 138 mmol/L (ref 135–146)
TOTAL PROTEIN: 7.2 g/dL (ref 6.1–8.1)
Total Bilirubin: 0.5 mg/dL (ref 0.2–1.2)

## 2016-08-05 MED ORDER — ASPIRIN 81 MG PO TABS: 81.0000 mg | ORAL_TABLET | Freq: Every day | ORAL | 0 refills | Status: DC

## 2016-08-05 NOTE — Patient Instructions (Signed)
Conversion Disorder Conversion disorder is also known as functional neurological symptom disorder. People with this mental disorder have symptoms that suggest a brain or nervous system condition, such as a stroke or seizure. However, no such condition can be found to explain the symptoms. For people with conversion disorder, the symptoms may result in:  Severe distress or anxiety.  Disruption of relationships or other normal life activities.  Medical evaluation. This often occurs in the primary care or emergency room setting. Conversion disorder may begin anytime from late childhood through the young adult years. This disorder is more common in females than males. What are the causes? The exact cause of this disorder is unknown. It is often triggered by stressful life events involving personal relationships. What are the signs or symptoms? Signs and symptoms of conversion disorder may include:  Muscle weakness or paralysis. If this involves the bladder muscle, it can cause difficulty urinating.  Seizures or attacks of being unresponsive.  Abnormal movement, such as tremors, jerks, muscle spasms, loss of balance, or difficulty walking.  Difficulty swallowing or a feeling of having a lump in the throat.  Loss of speech (aphonia) or slurring of words (dysarthria).  Loss of the sensation of touch or pain.  Changes in vision, such as blindness or double vision.  Seeing things that are not there (hallucinations).  Changes in hearing, such as deafness. Symptoms usually occur suddenly but may increase gradually over time. They usually go away completely in a short time. Seizures and tremors are more likely than other conversion symptoms to come back or continue. How is this diagnosed? Diagnosis of this disorder starts with an evaluation by your health care provider. Exams and tests will be done to rule out serious physical health problems. The evaluation will vary depending on your specific  symptoms. It may include:  A physical exam, including a neurological exam.  Lab tests on blood or urine samples.  X-rays or other imaging studies.  An electroencephalogram (EEG) to monitor brain waves for seizure activity. Your health care provider may refer you to a mental health specialist for psychological evaluation. How is this treated? The main goal of treatment is to get the symptoms to go away as quickly as possible. Most people with this disorder respond well to relaxation techniques and reassurance from their health care provider that the symptoms are stress related. For people with symptoms that do not go away, the following treatment options are available:  Counseling or talk therapy. Talk therapy is provided by mental health specialists. It involves discussing stressful life events that may have triggered the symptoms and ways to cope with these issues.  Hypnotherapy. This is the use of hypnosis to help a person overcome conversion symptoms. It is provided by mental health specialists.  Amobarbital interview. This involves discussing stressful life events that may have triggered the symptoms. The mental health specialist asks you questions after you take a short-acting medicine (amobarbital) that produces a hypnotic state.  Medicine. Antidepressant medicine may be helpful even if a person with conversion symptoms is not depressed.  Physical therapy. Physical therapy can help maintain muscle strength in cases of long-term paralysis. Follow these instructions at home:  Take medicines only as directed by your health care provider.  Try to reduce stress.  Keep all follow-up visits as directed by your health care provider. This is important. Contact a health care provider if:  Your symptoms do not go away or they become severe.  You develop new symptoms. Get help  right away if: You have serious thoughts about hurting yourself or someone else. This information is not  intended to replace advice given to you by your health care provider. Make sure you discuss any questions you have with your health care provider. Document Released: 06/07/2010 Document Revised: 10/11/2015 Document Reviewed: 09/15/2013 Elsevier Interactive Patient Education  2017 [employer].

## 2016-08-05 NOTE — Addendum Note (Signed)
Addended by: Larey Seat on: 08/05/2016 02:20 PM   Modules accepted: Orders
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
## 2016-08-06 ENCOUNTER — Telehealth: Payer: Self-pay | Admitting: Neurology

## 2016-08-06 ENCOUNTER — Encounter: Payer: Self-pay | Admitting: Neurology

## 2016-08-06 MED ORDER — ALPRAZOLAM 0.5 MG PO TABS: ORAL_TABLET | ORAL | 0 refills | Status: DC

## 2016-08-06 NOTE — Telephone Encounter (Signed)
The patient told the girls at check out that she wants a open MRI and it to be sedative. I was able to get the MRI approved with BCBS and I sent her order to [facility] because they have a machine there that holds a perspm that is 500 lbs.

## 2016-08-06 NOTE — Telephone Encounter (Signed)
I called pt again to discuss. No answer, left another message asking her to call me back.

## 2016-08-06 NOTE — Telephone Encounter (Signed)
Agree with order. CD

## 2016-08-06 NOTE — Telephone Encounter (Signed)
I called pt, discussed the xanax RX with her. Pt is agreeable to this and asked that I send this order to the [facility] in [place]. Received a receipt of confirmation. Pt verbalized understanding to bring a driver.  Pt is asking when her MRI can be scheduled? I will send to our MRI coordinator.  Pt also wanted to inform Dr. Brett Fairy that the right side of her neck has been hurting and is not sure if this is related to what is going on or not, but wanted to make sure that Dr. Brett Fairy is aware. I advised her that I will inform Dr. Brett Fairy, but further discussion regarding her neck pain will most likely take place after her MRI. Pt verbalized understanding.

## 2016-08-06 NOTE — Progress Notes (Signed)
Discuss to avoid all NSAIDS and Tylenol

## 2016-08-06 NOTE — Telephone Encounter (Signed)
I spoke to Dr. Brett Fairy. Pt may have xanax 3 tablets, but no sedation for her MRI.  I called pt to discuss. No answer, left a message asking her to call me back.   I have also routed this to Dr. Brett Fairy to approve the xanax RX.

## 2016-08-07 NOTE — Progress Notes (Signed)
Repeat comprehensive metabolic panel and one-month

## 2016-08-07 NOTE — Telephone Encounter (Signed)
I spoke with the patient and informed her that I sent the order to GI and that they have a machine there that holds up to 500 LB.. I also informed her if she does not hear from them in the next day or 2 to call at [phone].

## 2016-08-12 ENCOUNTER — Institutional Professional Consult (permissible substitution): Payer: BC Managed Care – PPO | Admitting: Neurology

## 2016-08-14 ENCOUNTER — Telehealth: Payer: Self-pay | Admitting: Neurology

## 2016-08-14 DIAGNOSIS — Z87898 Personal history of other specified conditions: Secondary | ICD-10-CM

## 2016-08-14 DIAGNOSIS — G450 Vertebro-basilar artery syndrome: Secondary | ICD-10-CM

## 2016-08-14 NOTE — Telephone Encounter (Signed)
Cyril Mourning will you please change order for Dr. Brett Fairy for TCD doppler to be done here at [facility] . Patient wants me to call her back today with apt.

## 2016-08-14 NOTE — Telephone Encounter (Signed)
Order fixed.  

## 2016-08-17 ENCOUNTER — Ambulatory Visit
Admission: RE | Admit: 2016-08-17 | Discharge: 2016-08-17 | Disposition: A | Discharge status: Home / Self Care | Payer: BC Managed Care – PPO | Source: Ambulatory Visit | Attending: Neurology | Admitting: Neurology

## 2016-08-17 DIAGNOSIS — G450 Vertebro-basilar artery syndrome: Secondary | ICD-10-CM | POA: Diagnosis not present

## 2016-08-17 DIAGNOSIS — Z87898 Personal history of other specified conditions: Secondary | ICD-10-CM

## 2016-08-17 MED ORDER — GADOBENATE DIMEGLUMINE 529 MG/ML IV SOLN
10.0000 mL | Freq: Once | INTRAVENOUS | Status: AC | PRN
  Administered 2016-08-17: 10 mL via INTRAVENOUS

## 2016-08-19 ENCOUNTER — Telehealth: Payer: Self-pay | Admitting: Neurology

## 2016-08-19 NOTE — Telephone Encounter (Signed)
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 Duwayne Heck mild, radiologist have suggested repeat MRI of the brain with and without contrast in few months.  IMPRESSION:  This MRI of the brain with and without contrast shows the following: 1.    There is a 9 mm focus in the superficial right supraorbital frontal lobe with a hemosiderin rim and heterogenous center with slight enhancement. 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  It is not apparent on the 2014 MRI.   Consider repeat imaging in a couple months to assess stability. 2.    The brain is otherwise normal.

## 2016-08-19 NOTE — Telephone Encounter (Signed)
LM for patient to call back for results of MRI.

## 2016-08-20 ENCOUNTER — Other Ambulatory Visit: Payer: Self-pay | Admitting: *Deleted

## 2016-08-20 ENCOUNTER — Other Ambulatory Visit: Payer: BC Managed Care – PPO

## 2016-08-20 ENCOUNTER — Telehealth: Payer: Self-pay | Admitting: Rheumatology

## 2016-08-20 DIAGNOSIS — G450 Vertebro-basilar artery syndrome: Secondary | ICD-10-CM

## 2016-08-20 DIAGNOSIS — Z87898 Personal history of other specified conditions: Secondary | ICD-10-CM

## 2016-08-20 NOTE — Telephone Encounter (Signed)
Attempted to contact the patient and left message for patient to call the office. Patient will need a CMP around 09/07/16.

## 2016-08-20 NOTE — Telephone Encounter (Signed)
Patient would like a return call stating as to when she needs to have her labs done again.  CB#[phone]

## 2016-08-20 NOTE — Telephone Encounter (Signed)
Called and patient is rescheduled for her Doppler at [facility] 08/28/2016 at 10:00 am  . Patient is aware of change.   Beverlee Nims Patient has a follow up to go over MRI Results / Doppler results on 08/27/2016. Her follow  Up will need to be moved . I don't see any open slots.

## 2016-08-20 NOTE — Telephone Encounter (Signed)
Patient would like a call back

## 2016-08-20 NOTE — Telephone Encounter (Signed)
Patient is aware of results below, we made f/u appt for next Wednesday per Dr. Rhea Belton suggestion. Patient has not had doppler study yet. Do you want to keep her appt on 4/11? Or postpone it to after the doppler study?

## 2016-08-21 NOTE — Telephone Encounter (Signed)
Patient is calling back to reschedule 08-27-16 appointment with Dr. Brett Fairy because Doppler had to be changed to 08-28-16. She needs appointment after Doppler. Please see previous note.

## 2016-08-21 NOTE — Telephone Encounter (Signed)
I spoke to patient and tried to r/s appt. She requested to keep the one on the 11th even if she has to schedule another appt soon after.

## 2016-08-21 NOTE — Telephone Encounter (Addendum)
Patient advised labs are due approximately 09/07/16. patient verbalized understanding.

## 2016-08-26 ENCOUNTER — Ambulatory Visit (HOSPITAL_COMMUNITY)
Admission: RE | Admit: 2016-08-26 | Discharge: 2016-08-26 | Disposition: A | Discharge status: Home / Self Care | Payer: BC Managed Care – PPO | Source: Ambulatory Visit | Attending: Neurology | Admitting: Neurology

## 2016-08-26 DIAGNOSIS — Z1589 Genetic susceptibility to other disease: Secondary | ICD-10-CM | POA: Insufficient documentation

## 2016-08-26 DIAGNOSIS — R7989 Other specified abnormal findings of blood chemistry: Secondary | ICD-10-CM | POA: Insufficient documentation

## 2016-08-26 DIAGNOSIS — M19072 Primary osteoarthritis, left ankle and foot: Secondary | ICD-10-CM

## 2016-08-26 DIAGNOSIS — G450 Vertebro-basilar artery syndrome: Secondary | ICD-10-CM | POA: Insufficient documentation

## 2016-08-26 DIAGNOSIS — Z87898 Personal history of other specified conditions: Secondary | ICD-10-CM | POA: Insufficient documentation

## 2016-08-26 DIAGNOSIS — M0579 Rheumatoid arthritis with rheumatoid factor of multiple sites without organ or systems involvement: Secondary | ICD-10-CM | POA: Insufficient documentation

## 2016-08-26 DIAGNOSIS — M19071 Primary osteoarthritis, right ankle and foot: Secondary | ICD-10-CM | POA: Insufficient documentation

## 2016-08-26 DIAGNOSIS — R5383 Other fatigue: Secondary | ICD-10-CM | POA: Insufficient documentation

## 2016-08-26 DIAGNOSIS — Z8659 Personal history of other mental and behavioral disorders: Secondary | ICD-10-CM | POA: Insufficient documentation

## 2016-08-26 DIAGNOSIS — E28319 Asymptomatic premature menopause: Secondary | ICD-10-CM | POA: Insufficient documentation

## 2016-08-26 DIAGNOSIS — R945 Abnormal results of liver function studies: Secondary | ICD-10-CM

## 2016-08-26 DIAGNOSIS — Z79899 Other long term (current) drug therapy: Secondary | ICD-10-CM | POA: Insufficient documentation

## 2016-08-26 DIAGNOSIS — Z8719 Personal history of other diseases of the digestive system: Secondary | ICD-10-CM | POA: Insufficient documentation

## 2016-08-26 DIAGNOSIS — E559 Vitamin D deficiency, unspecified: Secondary | ICD-10-CM | POA: Insufficient documentation

## 2016-08-26 DIAGNOSIS — K3184 Gastroparesis: Secondary | ICD-10-CM | POA: Insufficient documentation

## 2016-08-26 NOTE — Progress Notes (Signed)
Transcranial Doppler has been completed.  Landry Mellow, RDMS, RVT 08/26/2016

## 2016-08-26 NOTE — Telephone Encounter (Signed)
She needs to make the tech about her early upcoming appointment. Debbie [name]

## 2016-08-26 NOTE — Progress Notes (Signed)
Office Visit Note  Patient: Debbie [name]             Date of Birth: [birth year]           MRN: [id number]             PCP: Mateo Flow, MD Referring: Mateo Flow, MD Visit Date: 09/04/2016 Occupation: _0 @    Subjective:  Left wrist pain   History of Present Illness: Debbie [name] is a 46 y.o. female with history of rheumatoid arthritis and osteoarthritis overlap. She states she had very difficult last few months. Since September she has been having difficulty keeping her food down. She had episodes of nausea and vomiting. Is also had episodes of severe diarrhea. She was seen by her gastroenterologist here and then was referred to [facility]. She had GI workup there last week. No definite diagnosis was given. She also had spells where she passed out 10 was seen at the emergency room She was evaluated by neurologist had MRI of her brain which showed questionable she had a CT scan pending at this point. She continues to have some dizzy spells. She continues to have some neck discomfort. She has some discomfort in her left wrist joint. She denies any joint swelling.  Activities of Daily Living:  Patient reports morning stiffness for 0 minutes.   Patient Reports nocturnal pain.  Difficulty dressing/grooming: Denies Difficulty climbing stairs: Denies Difficulty getting out of chair: Denies Difficulty using hands for taps, buttons, cutlery, and/or writing: Denies   Review of Systems  Constitutional: Positive for fatigue. Negative for night sweats, weight gain, weight loss and weakness.  HENT: Negative for mouth sores, trouble swallowing, trouble swallowing, mouth dryness and nose dryness.   Eyes: Negative for pain, redness, visual disturbance and dryness.  Respiratory: Negative for cough, shortness of breath and difficulty breathing.   Cardiovascular: Negative for chest pain, palpitations, hypertension, irregular heartbeat and swelling in legs/feet.    Gastrointestinal: Positive for diarrhea and nausea. Negative for blood in stool and constipation.  Endocrine: Negative for increased urination.  Genitourinary: Negative for vaginal dryness.  Musculoskeletal: Positive for arthralgias, joint pain and morning stiffness. Negative for joint swelling, myalgias, muscle weakness, muscle tenderness and myalgias.  Skin: Negative for color change, rash, hair loss, skin tightness, ulcers and sensitivity to sunlight.  Allergic/Immunologic: Negative for susceptible to infections.  Neurological: Positive for dizziness. Negative for memory loss and night sweats.  Hematological: Negative for swollen glands.  Psychiatric/Behavioral: Positive for sleep disturbance. Negative for depressed mood. The patient is not nervous/anxious.     PMFS History:  Patient Active Problem List   Diagnosis Date Noted  . Primary osteoarthritis of both hands 08/31/2016  . Abnormal finding on MRI of brain 08/27/2016  . Rheumatoid arthritis involving multiple sites with positive rheumatoid factor ([place]) 08/26/2016  . Primary osteoarthritis of both feet 08/26/2016  . High risk medication use 08/26/2016  . HLA B27 (HLA B27 positive) 08/26/2016  . Other fatigue 08/26/2016  . Vitamin D deficiency 08/26/2016  . Elevated LFTs 08/26/2016  . History of anxiety 08/26/2016  . History of gastroesophageal reflux (GERD) 08/26/2016  . History of eating disorder 08/26/2016  . Early menopause 08/26/2016  . History of cholelithiasis 08/26/2016  . Gastroparesis 08/26/2016  . 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 Pain and swelling of right forearm   . Autoimmune disease ([place]) 10/11/2012  . Kidney stones 10/11/2012  . Chronic headaches 10/11/2012    Past Medical History:  Diagnosis  Date  . Allergy   . Anemia   . Anorexia nervosa with bulimia 2004  . Arthritis   . Gastroparesis   . GERD (gastroesophageal reflux disease)   . Kidney stones   . Lupus   . Menopause   .  Pain and swelling of right forearm     Family History  Problem Relation Age of Onset  . Arthritis Mother   . Hernia Mother   . Cancer Mother     melanoma  . Hypertension Mother   . Diabetes Father   . Lupus Sister   . Diabetes Maternal Grandmother   . Cancer Maternal Grandfather   . Diabetes Paternal Grandfather   . Heart disease Paternal Grandfather   . Heart disease Paternal Grandmother   . Diabetes Other    Past Surgical History:  Procedure Laterality Date  . ABDOMINAL HYSTERECTOMY     2x,2008-2009  . CARPAL TUNNEL RELEASE Right   . CHOLECYSTECTOMY    . CYSTOSCOPY WITH RETROGRADE PYELOGRAM, URETEROSCOPY AND STENT PLACEMENT Left 06/22/2015   Procedure: LEFT URETEROSCOPY AND STONE EXTRACTION ;  Surgeon: Kathie Rhodes, MD;  Location: [facility];  Service: Urology;  Laterality: Left;  . KIDNEY STONE SURGERY     5  . OVARIAN CYST SURGERY  2007   LAP   Social History   Social History Narrative   Married. Education: [employer]. Exercise 3 times 1 hour or so. Patient consumes 1 or 2 cups of coffee and does exercise.     Objective: Vital Signs: BP 122/70   Pulse 78   Resp 14   Ht '5\' 1"'$  (1.549 m)   Wt 136 lb (61.7 kg)   BMI 25.70 kg/m    Physical Exam  Constitutional: She is oriented to person, place, and time. She appears well-developed and well-nourished.  HENT:  Head: Normocephalic and atraumatic.  Eyes: Conjunctivae and EOM are normal.  Neck: Normal range of motion.  Cardiovascular: Normal rate, regular rhythm, normal heart sounds and intact distal pulses.   Pulmonary/Chest: Effort normal and breath sounds normal.  Abdominal: Soft. Bowel sounds are normal.  Lymphadenopathy:    She has no cervical adenopathy.  Neurological: She is alert and oriented to person, place, and time.  Skin: Skin is warm and dry. Capillary refill takes less than 2 seconds.  Psychiatric: She has a normal mood and affect. Her behavior is normal.  Nursing note and vitals reviewed.     Musculoskeletal Exam: C-spine and thoracic lumbar spine good range of motion. She is some discomfort range of motion of her C-spine. Shoulder joints elbow joints wrist joints MCPs PIPs were good range of motion. She is mild thickening of her DIP joints. Joints knee joints ankles MTPs PIPs with good range of motion with no synovitis.  CDAI Exam: CDAI Homunculus Exam:   Joint Counts:  CDAI Tender Joint count: 0 CDAI Swollen Joint count: 0  Global Assessments:  Patient Global Assessment: 2 Provider Global Assessment: 2  CDAI Calculated Score: 4    Investigation: Findings:  In October of 2011, CBC, AST, ALT, creatinine, hepatitis, lupus anticoagulant, and UA were negative.  Her ANA was 1:320 nucleolar pattern.  C3 and C4 were normal.  Double stranded DNA was 170.  Beta 2 and anti-Cardiolipin were negative.  We did obtain x-ray of her right hip joint which was within normal limits and lumbar spine was also within normal limits without any disk space narrowing or facet joint arthropathy.    Labs from August 06, 2010 show  a SED rate and C3-C4 and urinalysis all normal.  ANA was positive.  ANA titer was 1:320 with homogenous pattern.  Anti-DNA double stranded was increased at 209.    Chest x-ray in March of 2012 was normal.  In April 2016 CBC, comprehensive metabolic panel, sed rate, UA and urine drug screen were normal.  ANA was negative.  Complements were normal.  CCP was 5.7 which was barely positive.  Vitamin D was 33.     April 2017:  CBC was normal.  Comprehensive metabolic panel showed alkaline phosphatase 131, AST 33, ALT 67.  UA was negative.  Sed rate was 5.  ANA was 1:320 nucleolar pattern.  Double strand DNA was negative.  Complements were normal.  CCP was 33.  Vitamin D was low at 21.    09/2015 normal PLQ eye exam     Imaging: Ct Angio Head W Or Wo Contrast  Result Date: 09/03/2016 CLINICAL DATA:  Abnormal MRI.  Vascular lesion right frontal lobe EXAM: CT ANGIOGRAPHY HEAD  TECHNIQUE: Multidetector CT imaging of the head was performed using the standard protocol during bolus administration of intravenous contrast. Multiplanar CT image reconstructions and MIPs were obtained to evaluate the vascular anatomy. CONTRAST:  100 mL Isovue 370 IV COMPARISON:  MRI 08/17/2016 FINDINGS: CT HEAD Brain: Chronic hemorrhagic lesion in the right inferior frontal lobe just above the orbital roof is not seen on unenhanced CT images. This is noted on MRI. The lesion does show mild enhancement postcontrast administration. Ventricle size normal. No acute hemorrhage, mass, or edema. Vascular: Negative for hyperdense vessel Skull: Negative Sinuses: Negative Orbits: Negative CTA HEAD Anterior circulation: Cavernous carotid widely patent bilaterally without stenosis or aneurysm. Anterior and middle cerebral arteries are normal bilaterally without stenosis. No abnormal vascularity in the right frontal lobe. No draining vein or AVM Identified. Posterior circulation: Both vertebral arteries are patent to the basilar normal. Basilar widely patent. PICA, superior cerebellar, and posterior cerebral arteries normal bilaterally. Venous sinuses: Normal enhancement of the dural sinuses without thrombus or occlusion Anatomic variants: None Delayed phase: Mild enhancement in the right inferior frontal lobe measuring approximately 8 mm. This corresponds to the chronic hemorrhage on MRI and is most consistent with a cavernoma. This lesion also shows mild enhancement on MRI. No surrounding edema or hemorrhage. IMPRESSION: Small cavernoma right frontal lobe. No abnormal vascularity is present in the area. Otherwise negative CTA head Electronically Signed   By: Franchot Gallo M.D.   On: 09/03/2016 15:22   Mr Jeri Cos IW Contrast  Result Date: 08/18/2016  [facility] [address] [place], [place] [address] [phone] NEUROIMAGING REPORT STUDY DATE: 08/17/2016 PATIENT NAME: JUNELL CULLIFER DOB:  August 13, 1970 MRN: [id number] EXAM: MRI Brain with and without contrast ORDERING CLINICIAN: Larey Seat M.D. 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  T1 sagittal, T2 coronal and postcontrast views in the axial and coronal plane were obtained. CONTRAST: 10 ml Multihance IMAGING SITE: [facility], [place]. FINDINGS: On sagittal images, the spinal cord is imaged caudally to C3 and is normal in caliber.   The contents of the posterior fossa are of normal size and position.   The pituitary gland and optic chiasm appear normal.    Brain volume appears normal.   The ventricles are normal in size and without  distortion.  There are no abnormal extra-axial collections of fluid.  There is a 9 mm focus within the gray white junction in the right supraorbital frontal lobe. It has a hemosiderin rim (hypointense rim on SWI, DWI, T1T2-weighted images) with a more hyperintense center that slightly enhances.  The rest of the brain appears normal for age. The cerebellum and brainstem appears normal.   The deep gray matter appears normal.  The orbits appear normal.   The VIIth/VIIIth nerve complex appears normal.  The mastoid air cells appear normal.  There are small maxillary retention cyst. The other paranasal sinuses appear normal.  Flow voids are identified within the major intracerebral arteries.   The study was compared to an MRI of the brain dated 10/12/2012. The right frontal focus was not apparent on the earlier MRI.    This MRI of the brain with and without contrast shows the following: 1.    There is a 9 mm focus in the superficial right supraorbital frontal lobe with a hemosiderin rim and heterogenous center with slight enhancement. 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  It  is not apparent on the 2014 MRI.   Consider repeat imaging in a couple months to assess stability. 2.    The brain is otherwise normal. INTERPRETING PHYSICIAN: Richard A. Felecia Shelling, MD, PhD Certified in  Neuroimaging by [employer]    Speciality Comments: No specialty comments available.  CBC    Component Value Date/Time   WBC 12.6 (H) 08/05/2016 1512   RBC 4.33 08/05/2016 1512   HGB 13.0 08/05/2016 1512   HCT 37.5 08/05/2016 1512   PLT 295 08/05/2016 1512   MCV 86.6 08/05/2016 1512   MCV 90.0 10/11/2012 1702   MCH 30.0 08/05/2016 1512   MCHC 34.7 08/05/2016 1512   RDW 14.0 08/05/2016 1512   LYMPHSABS 2,268 08/05/2016 1512   MONOABS 378 08/05/2016 1512   EOSABS 126 08/05/2016 1512   BASOSABS 0 08/05/2016 1512   CMP     Component Value Date/Time   NA 138 08/05/2016 1512   K 4.5 08/05/2016 1512   CL 104 08/05/2016 1512   CO2 24 08/05/2016 1512   GLUCOSE 81 08/05/2016 1512   BUN 15 08/05/2016 1512   CREATININE 0.64 08/05/2016 1512   CALCIUM 9.6 08/05/2016 1512   PROT 7.2 08/05/2016 1512   ALBUMIN 4.3 08/05/2016 1512   AST 40 (H) 08/05/2016 1512   ALT 77 (H) 08/05/2016 1512   ALKPHOS 155 (H) 08/05/2016 1512   BILITOT 0.5 08/05/2016 1512   GFRNONAA >89 08/05/2016 1512   GFRAA >89 08/05/2016 1512    Procedures:  No procedures performed Allergies: Zofran Alvis Lemmings hcl] and Metoclopramide   Assessment / Plan:     Visit Diagnoses: Rheumatoid arthritis involving multiple sites with positive rheumatoid factor (H[facility]) - positive CCP, positive HLA-B27, history of dsDNA in the past which became negative later. Patient has no synovitis on examination. Her arthritis seems to be very well controlled.  High risk medication use - Plaquenil 200 mg by mouth twice a day Monday to Friday. Her LFTs are elevated am uncertain about the true etiology of her elevated  LFTs. She denies intake of any NSAIDs. I've advised her to discuss this further with her GI doctor. Will  check her labs again in May and then every 3 months to monitor for drug toxicity. Plaquenil is not associated with elevated LFTs.  Primary osteoarthritis of both hands: She is very mild osteoarthritis in her hands  Primary osteoarthritis of both feet: Very mild osteoarthritic changes in her feet  Other fatigue she continues to have a lot of fatigue  Vitamin D deficiency she is on vitamin D supplements  Elevated LFTs  History of anxiety  She's having vertigo. She is going through neurological workup currently.  Gastroparesis: Followed up by her gastroenterologist  History of cholelithiasis    Orders: Orders Placed This Encounter  Procedures  . CBC with Differential/Platelet  . COMPLETE METABOLIC PANEL WITH GFR   No orders of the defined types were placed in this encounter.   Face-to-face time spent with patient was 35 minutes. 50% of time was spent in counseling and coordination of care.  Follow-Up Instructions: Return in about 6 months (around 03/06/2017) for Rheumatoid arthritis.   Bo Merino, MD  Note - This record has been created using Editor, commissioning.  Chart creation errors have been sought, but may not always  have been located. Such creation errors do not reflect on  the standard of medical care.

## 2016-08-26 NOTE — Telephone Encounter (Signed)
Patient has appt tomorrow. She had her TCD completed this morning at the hospital. Do you think you will get the results in time for her appt tomorrow?

## 2016-08-27 ENCOUNTER — Ambulatory Visit (INDEPENDENT_AMBULATORY_CARE_PROVIDER_SITE_OTHER): Payer: BC Managed Care – PPO | Admitting: Neurology

## 2016-08-27 ENCOUNTER — Encounter: Payer: Self-pay | Admitting: Neurology

## 2016-08-27 VITALS — BP 104/70 | HR 70 | Resp 14 | Ht 61.5 in | Wt 133.0 lb

## 2016-08-27 DIAGNOSIS — R9089 Other abnormal findings on diagnostic imaging of central nervous system: Secondary | ICD-10-CM | POA: Insufficient documentation
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
## 2016-08-28 ENCOUNTER — Inpatient Hospital Stay (HOSPITAL_COMMUNITY): Admission: RE | Admit: 2016-08-28 | Payer: BC Managed Care – PPO | Source: Ambulatory Visit

## 2016-08-29 DIAGNOSIS — M329 Systemic lupus erythematosus, unspecified: Secondary | ICD-10-CM

## 2016-08-29 HISTORY — DX: Systemic lupus erythematosus, unspecified: M32.9

## 2016-08-31 DIAGNOSIS — M19041 Primary osteoarthritis, right hand: Secondary | ICD-10-CM | POA: Insufficient documentation

## 2016-08-31 DIAGNOSIS — M19042 Primary osteoarthritis, left hand: Secondary | ICD-10-CM

## 2016-09-03 ENCOUNTER — Ambulatory Visit (HOSPITAL_COMMUNITY)
Admission: RE | Admit: 2016-09-03 | Discharge: 2016-09-03 | Disposition: A | Discharge status: Home / Self Care | Payer: BC Managed Care – PPO | Source: Ambulatory Visit | Attending: Neurology | Admitting: Neurology

## 2016-09-03 DIAGNOSIS — G9389 Other specified disorders of brain: Secondary | ICD-10-CM | POA: Insufficient documentation

## 2016-09-03 DIAGNOSIS — R0989 Other specified symptoms and signs involving the circulatory and respiratory systems: Secondary | ICD-10-CM | POA: Insufficient documentation

## 2016-09-03 DIAGNOSIS — R9089 Other abnormal findings on diagnostic imaging of central nervous system: Secondary | ICD-10-CM

## 2016-09-03 MED ORDER — IOPAMIDOL (ISOVUE-370) INJECTION 76%
INTRAVENOUS | Status: AC
  Administered 2016-09-03: 100 mL
  Filled 2016-09-03: qty 100

## 2016-09-04 ENCOUNTER — Encounter: Payer: Self-pay | Admitting: Rheumatology

## 2016-09-04 ENCOUNTER — Telehealth: Payer: Self-pay

## 2016-09-04 ENCOUNTER — Ambulatory Visit (INDEPENDENT_AMBULATORY_CARE_PROVIDER_SITE_OTHER): Payer: BC Managed Care – PPO | Admitting: Rheumatology

## 2016-09-04 VITALS — BP 122/70 | HR 78 | Resp 14 | Ht 61.0 in | Wt 136.0 lb

## 2016-09-04 DIAGNOSIS — R945 Abnormal results of liver function studies: Secondary | ICD-10-CM

## 2016-09-04 DIAGNOSIS — M19042 Primary osteoarthritis, left hand: Secondary | ICD-10-CM

## 2016-09-04 DIAGNOSIS — M19041 Primary osteoarthritis, right hand: Secondary | ICD-10-CM

## 2016-09-04 DIAGNOSIS — R7989 Other specified abnormal findings of blood chemistry: Secondary | ICD-10-CM | POA: Diagnosis not present

## 2016-09-04 DIAGNOSIS — R5383 Other fatigue: Secondary | ICD-10-CM

## 2016-09-04 DIAGNOSIS — M19072 Primary osteoarthritis, left ankle and foot: Secondary | ICD-10-CM

## 2016-09-04 DIAGNOSIS — Z8659 Personal history of other mental and behavioral disorders: Secondary | ICD-10-CM

## 2016-09-04 DIAGNOSIS — Z8719 Personal history of other diseases of the digestive system: Secondary | ICD-10-CM

## 2016-09-04 DIAGNOSIS — M19071 Primary osteoarthritis, right ankle and foot: Secondary | ICD-10-CM | POA: Diagnosis not present

## 2016-09-04 DIAGNOSIS — G43109 Migraine with aura, not intractable, without status migrainosus: Secondary | ICD-10-CM

## 2016-09-04 DIAGNOSIS — K3184 Gastroparesis: Secondary | ICD-10-CM | POA: Diagnosis not present

## 2016-09-04 DIAGNOSIS — Z79899 Other long term (current) drug therapy: Secondary | ICD-10-CM

## 2016-09-04 DIAGNOSIS — E559 Vitamin D deficiency, unspecified: Secondary | ICD-10-CM

## 2016-09-04 DIAGNOSIS — G252 Other specified forms of tremor: Secondary | ICD-10-CM

## 2016-09-04 DIAGNOSIS — M0579 Rheumatoid arthritis with rheumatoid factor of multiple sites without organ or systems involvement: Secondary | ICD-10-CM

## 2016-09-04 NOTE — Telephone Encounter (Signed)
Patient called office returning RN's call.  Please call °

## 2016-09-04 NOTE — Telephone Encounter (Signed)
LM for patient to call back for results

## 2016-09-04 NOTE — Patient Instructions (Signed)
Standing Labs We placed an order today for your standing lab work.    Please come back and get your standing labs in May and every 3 months.  We have open lab Monday through Friday from 8:30-11:30 AM and 1:30-4 PM at the office of Dr. Tresa Moore, PA.   The office is located at [address] [place], [place], [place] [address] No appointment is necessary.   Labs are drawn by [facility].  You may receive a bill from [place] for your lab work.

## 2016-09-04 NOTE — Telephone Encounter (Signed)
-----   Message from Star Age, MD sent at 09/04/2016  1:09 PM EDT ----- Please notify patient: CT angio head from yesterday confirmed the vascular lesion in the R frontal lobe seen on the recent brain MRI. No additional findings. FU as planned with Dr. Keturah Barre or MM.  Star Age, MD, PhD [facility])

## 2016-09-04 NOTE — Progress Notes (Signed)
Please notify patient: CT angio head from yesterday confirmed the vascular lesion in the R frontal lobe seen on the recent brain MRI. No additional findings. FU as planned with Dr. Keturah Barre or MM.  Star Age, MD, PhD [facility])

## 2016-09-08 NOTE — Telephone Encounter (Signed)
I spoke to patient and she is aware of results below. Dr. Brett Fairy, patient asks where to go from here? Options were discussed at last office visit.  States that she is still experiencing facial and arm numbness and the feeling of her "head will explode". Patient aware you are currently out of the office.

## 2016-09-16 NOTE — Addendum Note (Signed)
Addended by: Larey Seat on: 09/16/2016 04:05 PM   Modules accepted: Orders

## 2016-09-16 NOTE — Telephone Encounter (Signed)
Patient called office in reference to CT results.  Patient states she is having difficulty walking today and major headaches.  Patients legs went out last Tuesday while at work now balance and gait are off.  Please call

## 2016-09-16 NOTE — Telephone Encounter (Signed)
I have not been able to identify a neurological organic source and cause for her symptoms.  I wil offer her to to see a headache specialist, dr Domingo Cocking, here in [place]. CD

## 2016-09-16 NOTE — Telephone Encounter (Signed)
I called pt. I explained to her that Dr. Brett Fairy has not been able to identify a neurological organic source and cause for her symptoms and recommends that pt see a headache specialist, Dr. Domingo Cocking.  Pt says that she is still having tremors and stuttering and wants to know what doctor she can see for these problems since Dr. Brett Fairy does not know what is going on. Pt reports that her PCP and rheumatologist can't explain her symptoms either. Pt refuses to see Dr. Domingo Cocking at this time. She wants treatment for her tremors and stuttering. I advised pt that I will send this request to Dr. Brett Fairy for review.

## 2016-09-19 NOTE — Addendum Note (Signed)
Addended by: Larey Seat on: 09/19/2016 11:02 AM   Modules accepted: Orders

## 2016-09-19 NOTE — Telephone Encounter (Signed)
I recommend to see dr Tat, for further tremor evaluation.

## 2016-09-22 NOTE — Telephone Encounter (Signed)
Pt returned my call. She is agreeable to seeing Dr. Carles Collet at [facility] for tremors.  Pt states that she does not want time off of work;she needs the FMLA filled out by all her MDs she sees so she can get reimbursed for all the doctor's appts. I advised her that I will pass this info along to Dr. Brett Fairy.  Pt says that she is now going to also be evaluated by an ENT for possible meniere's disease and this is just an [place]. Will also pass on to Dr. Brett Fairy.

## 2016-09-22 NOTE — Telephone Encounter (Signed)
I spoke to Dr. Brett Fairy regarding pt's request for [facility] paperwork. Dr. Brett Fairy does not have a neurological diagnosis for this pt, therefore, would prefer that pt's PCP fill out the [facility] paperwork.  I again called pt to discuss. No answer, left a message asking her to call me back.

## 2016-09-22 NOTE — Telephone Encounter (Signed)
I called the pt to discuss. No answer, left a message asking her to call me back.

## 2016-09-22 NOTE — Telephone Encounter (Signed)
Patient called office returning RN's call.  Please call °

## 2016-09-24 NOTE — Telephone Encounter (Signed)
FMLA form completed and signed by Dr. Brett Fairy. Sent to MR for processing.

## 2016-09-29 NOTE — Progress Notes (Signed)
Subjective:   Debbie [name] was seen in consultation in the movement disorder clinic at the request of Dr. Brett Fairy.  Her PCP is Mateo Flow, MD.  She is accompanied by her daughter who supplements the history.  The records that were made available to me were reviewed from Dr. Brett Fairy as well as from other providers, including [facility], where she presented in 04/2016 after becoming dizzy and near syncopal.  Reports that she has had several similar spells and has been seen and treated for dehydration.  She has been following with Dr. Brett Fairy for these as well.  She feels slower and weak, but presents today to discuss the tremulousness that accompanies the spells.  She states that usually the spells of dizziness/loss of balance/stuttering speech and tremulousness occurred together but since last Thursday tremor in the R hand started all alone without the other sx's.  On last Thursday, she was on a field trip with kids and it was a tough day and she had the stuttering speech and noted the onset of the tremor.  Tremor persisted without the other sx's.  She doesn't know what makes it worse or better.  Daughter states that just time makes it better.    Affected by caffeine:  No. (2-3 cups coffee/day) Affected by alcohol:  Doesn't drink it Affected by stress:  No. Affected by fatigue:  unknown Spills soup if on spoon:  No. (if she thinks about it, it is better) Affects ADL's (tying shoes, brushing teeth, etc):  Yes.   (trouble putting on eyeliner; drops things easier)  Current/Previously tried tremor medications: none  Current medications that may exacerbate tremor:  none  Outside reports reviewed: historical medical records, office notes, radiology reports and referral letter/letters.MRI brain done on 08/17/16 and demonstrated a 9 mm focus in the superficial right supraorbital frontal lobe with a hemosiderin rim and heterogenous center with slight enhancement. 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  It was otherwise unremarkable. 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  Allergies  Allergen Reactions  . Zofran [Ondansetron Hcl] Other (See Comments)    Stutters and looses control of her arm and legs  . Metoclopramide     Outpatient Encounter Prescriptions as of 09/30/2016  Medication Sig  . cetirizine (ZYRTEC) 10 MG tablet Take 10 mg by mouth daily as needed for allergies.   Marland Kitchen esomeprazole (NEXIUM) 40 MG capsule Take 40 mg by mouth daily.  Marland Kitchen estradiol (ESTRACE) 0.5 MG tablet Take 0.5 mg by mouth daily.  Marland Kitchen estradiol (ESTRACE) 1 MG tablet Take 1 mg by mouth daily.  Marland Kitchen glycopyrrolate (ROBINUL) 2 MG tablet Take 2 mg by mouth 3 (three) times daily.  . hydroxychloroquine (PLAQUENIL) 200 MG tablet Take 200 mg by mouth daily. One in the am one half in the evening  . Polyethyl Glycol-Propyl Glycol 0.4-0.3 % SOLN Place 1 drop into both eyes 2 (two) times daily as needed (dry/ irritated eyes).   . polyethylene glycol (MIRALAX / GLYCOLAX) packet Take 17 g by mouth daily.  . promethazine (PHENERGAN) 25 MG tablet Take 25 mg by mouth every 6 (six) hours as needed for nausea or vomiting.  Marland Kitchen VYVANSE 30 MG capsule Take 30 mg by mouth every morning.  . [DISCONTINUED] aspirin 81 MG tablet Take 1 tablet (81 mg total) by mouth daily.  . [DISCONTINUED] Cholecalciferol (VITAMIN D3 PO) Take 4 capsules by mouth daily.   No facility-administered encounter medications on file as of  09/30/2016.     Past Medical History:  Diagnosis Date  . Allergy   . Anemia   . Anorexia nervosa with bulimia 2004  . Arthritis   . Gastroparesis   . GERD (gastroesophageal reflux disease)   . Kidney stones   . Lupus   . Menopause   . Pain and swelling of right forearm     Past Surgical History:  Procedure Laterality Date  . ABDOMINAL HYSTERECTOMY     2x,2008-2009  . CARPAL TUNNEL RELEASE Right   . CHOLECYSTECTOMY    . CYSTOSCOPY WITH RETROGRADE PYELOGRAM,  URETEROSCOPY AND STENT PLACEMENT Left 06/22/2015   Procedure: LEFT URETEROSCOPY AND STONE EXTRACTION ;  Surgeon: Kathie Rhodes, MD;  Location: [facility];  Service: Urology;  Laterality: Left;  . KIDNEY STONE SURGERY     5  . OVARIAN CYST SURGERY  2007   LAP    Social History   Social History  . Marital status: Married    Spouse name: N/A  . Number of children: 2  . Years of education: BS   Occupational History  . [employer]   Social History Main Topics  . Smoking status: Never Smoker  . Smokeless tobacco: Never Used  . Alcohol use No  . Drug use: No  . Sexual activity: Yes   Other Topics Concern  . Not on file   Social History Narrative   Married. Education: [employer]. Exercise 3 times 1 hour or so. Patient consumes 1 or 2 cups of coffee and does exercise.    Family Status  Relation Status  . Mother Alive  . Father Deceased  . Sister Alive  . MGM Deceased  . MGF Deceased  . PGF Deceased  . Brother Alive  . Daughter Alive  . Son Alive  . PGM Deceased  . Sister Alive  . Other Alive    Review of Systems Occasional SOB - 1-2 times per week.  R side of body feels weaker intermittenly than L.  Occasional trouble swallowing and thinks due to GERD.  Occasional numbness in R face (few times a month).  A complete 10 system ROS was obtained and was negative apart from what is mentioned.   Objective:   VITALS:   Vitals:   09/30/16 0956  BP: 110/70  Pulse: 85  SpO2: 98%  Weight: 129 lb (58.5 kg)  Height: 5\' 1"  (1.549 m)   Gen:  Appears stated age and in NAD. HEENT:  Normocephalic, atraumatic. The mucous membranes are moist. The superficial temporal arteries are without ropiness or tenderness. Cardiovascular: Regular rate and rhythm. Lungs: Clear to auscultation bilaterally. Neck: There are no carotid bruits noted bilaterally.  NEUROLOGICAL:  Orientation:  The patient is alert and oriented x 3.  Recent and remote memory are  intact.  Attention span and concentration are normal.  Able to name objects and repeat without trouble.  Fund of knowledge is appropriate Cranial nerves: There is good facial symmetry. The pupils are round (R is 8-9 mm and L is 8mm) and reactive to light bilaterally. Fundoscopic exam reveals clear disc margins bilaterally. Extraocular muscles are intact and visual fields are full to confrontational testing. Speech is sometimes fluent, and sometimes stuttering in quality.  It is always clear. Soft palate rises symmetrically and there is no tongue deviation. Hearing is intact to conversational tone. Tone: Tone is good throughout. Sensation: Sensation is intact to light touch and pinprick throughout (facial, trunk, extremities). Vibration is intact at  the bilateral big toe. There is no extinction with double simultaneous stimulation. There is no sensory dermatomal level identified. Coordination:  The patient has no dysdiadichokinesia or dysmetria. Motor: Strength is 5/5 in the bilateral upper and lower extremities.  Shoulder shrug is equal bilaterally.  There is no pronator drift.  There are no fasciculations noted. DTR's: Deep tendon reflexes are 2-/4 at the bilateral biceps, triceps, brachioradialis, patella and achilles.  Plantar responses are downgoing bilaterally. Gait and Station: The patient ambulates slow and purposeful.  She marches with each step.  She has astasia abasia quality to the gait.  She is able to stand in the romberg position with eyes open and closed.    MOVEMENT EXAM: Tremor:  There is intermittent rest tremor of the right hand.  Tremor changes in frequency and variability as well as amplitude during the course of the visit.  Tremor changes in frequency when asked to tap out a rhythm with the opposite hand and tremor frequency matches the beat of the opposite hand.  Tremor is present with posture and with intent on the right.  When asked to draw Archimedes spirals, she is slow and  purposeful with both hands, but spirals are drawn fairly well.  When asked to pour water from one glass to another, she initially states that she would be unable to pick up the glass with the right hand, so she starts with the empty glass in the right hand, but ultimately is able to pour water from one glass to another with both hands and spills very little.    Labs:  No results found for: TSH    Chemistry      Component Value Date/Time   NA 138 08/05/2016 1512   K 4.5 08/05/2016 1512   CL 104 08/05/2016 1512   CO2 24 08/05/2016 1512   BUN 15 08/05/2016 1512   CREATININE 0.64 08/05/2016 1512      Component Value Date/Time   CALCIUM 9.6 08/05/2016 1512   ALKPHOS 155 (H) 08/05/2016 1512   AST 40 (H) 08/05/2016 1512   ALT 77 (H) 08/05/2016 1512   BILITOT 0.5 08/05/2016 1512          Assessment/Plan:   1.  Psychogenic tremor  -Long discussion with the patient and her daughter today.  Her history and physical examination are most consistent with psychogenic tremor.  I explained to her that this does not mean that she made up or caused the tremor, but rather that it is generated by stress, often times stress which is completely unknown to the patient.  Sometimes, this is stress that has not been dealt with for many years, as opposed to superficial stress.  The patient has other nonphysiologic aspects of the examination which support this diagnosis.  We talked about treatments, which really are nonpharmacologic.  Unfortunately, the patient was frustrated and not open to this diagnosis.  -If not already done so, I would recommend that the patient have a TSH.  -The patient will be following up with Dr. Brett Fairy regarding the abnormalities on her MRI of the brain and any other neurologic complaints as this was merely a 2nd opinion regarding her tremor.  She reports having an appointment next month.  2.  Much greater than 50% of this visit was spent in counseling and coordinating care.  Total  face to face time:  45 min  CC:  Mateo Flow, MD

## 2016-09-30 ENCOUNTER — Ambulatory Visit (INDEPENDENT_AMBULATORY_CARE_PROVIDER_SITE_OTHER): Payer: BC Managed Care – PPO | Admitting: Neurology

## 2016-09-30 ENCOUNTER — Encounter: Payer: Self-pay | Admitting: Neurology

## 2016-09-30 VITALS — BP 110/70 | HR 85 | Ht 61.0 in | Wt 129.0 lb

## 2016-09-30 DIAGNOSIS — F444 Conversion disorder with motor symptom or deficit: Secondary | ICD-10-CM | POA: Diagnosis not present

## 2016-10-01 ENCOUNTER — Telehealth: Payer: Self-pay | Admitting: Neurology

## 2016-10-01 NOTE — Progress Notes (Signed)
Thank you, Wells Guiles. I have told the patient that I have no neurologic explanation for her symptoms and that these are not related to the MRI findings.  I have suggested to seek psychological support- and will ask her to transfer her care to a tertiary center.     Khloi Rawl, MD

## 2016-10-07 ENCOUNTER — Other Ambulatory Visit: Payer: Self-pay | Admitting: Rheumatology

## 2016-10-07 ENCOUNTER — Other Ambulatory Visit: Payer: Self-pay | Admitting: *Deleted

## 2016-10-07 DIAGNOSIS — Z79899 Other long term (current) drug therapy: Secondary | ICD-10-CM

## 2016-10-07 NOTE — Telephone Encounter (Signed)
ok 

## 2016-10-07 NOTE — Telephone Encounter (Signed)
Last Visit: 09/04/16 Next Visit: 10/22/16 Labs: 08/05/16 AST 40 ALT 77 previous were normal  PLQ eye exam: 09/2015 WNL  Left patient a message to remind her she is due to update her PLQ eye exam  Okay to refill PLQ?

## 2016-10-14 NOTE — Telephone Encounter (Signed)
I have no further neurological work up to offer. Please return to PCP for referral as needed. See Dr Arturo Morton note. Tremor is not neurological in origin. CD

## 2016-10-20 ENCOUNTER — Telehealth: Payer: Self-pay | Admitting: Neurology

## 2016-10-20 NOTE — Telephone Encounter (Signed)
LMTC./fim 

## 2016-10-20 NOTE — Telephone Encounter (Signed)
Pt c/a appt on 6/6. She doesn't think she needs to f/u appt anymore and wants to discuss with RN. Please call

## 2016-10-22 ENCOUNTER — Ambulatory Visit: Payer: BC Managed Care – PPO | Admitting: Adult Health

## 2016-10-23 NOTE — Telephone Encounter (Signed)
LMTC (identified vm)/fim 

## 2016-10-29 DIAGNOSIS — M26623 Arthralgia of bilateral temporomandibular joint: Secondary | ICD-10-CM | POA: Insufficient documentation

## 2016-10-29 DIAGNOSIS — R42 Dizziness and giddiness: Secondary | ICD-10-CM | POA: Insufficient documentation

## 2016-11-07 DIAGNOSIS — R768 Other specified abnormal immunological findings in serum: Secondary | ICD-10-CM | POA: Insufficient documentation

## 2016-11-07 NOTE — Progress Notes (Signed)
Office Visit Note  Patient: Debbie [name]             Date of Birth: [birth year]           MRN: [id number]             PCP: Mateo Flow, MD Referring: Mateo Flow, MD Visit Date: 11/11/2016 Occupation: @GUAROCC @    Subjective:  Left wrist pain.   History of Present Illness: Debbie [name] is a 46 y.o. female with history of rheumatoid arthritis. She states that she had very difficult last year with some tremors and stuttering. She was finally placed on Vyvanse by her PCP. She states the stuttering and tremors have improved now in the last 1 month. She is also having some symptoms of tinnitus for which she'll be seeing ENT. She denies any joint swelling. She has some discomfort in her left wrist joint.  Activities of Daily Living:  Patient reports morning stiffness for 30 minutes.   Patient Denies nocturnal pain.  Difficulty dressing/grooming: Denies Difficulty climbing stairs: Denies Difficulty getting out of chair: Denies Difficulty using hands for taps, buttons, cutlery, and/or writing: Denies   Review of Systems  Constitutional: Positive for fatigue. Negative for night sweats, weight gain, weight loss and weakness.  HENT: Positive for mouth sores. Negative for trouble swallowing, trouble swallowing, mouth dryness and nose dryness.   Eyes: Positive for dryness. Negative for pain, redness and visual disturbance.  Respiratory: Negative for cough, shortness of breath and difficulty breathing.   Cardiovascular: Negative for chest pain, palpitations, hypertension, irregular heartbeat and swelling in legs/feet.  Gastrointestinal: Negative for blood in stool, constipation and diarrhea.  Endocrine: Negative for increased urination.  Genitourinary: Negative for vaginal dryness.  Musculoskeletal: Positive for arthralgias, joint pain and morning stiffness. Negative for joint swelling, myalgias, muscle weakness, muscle tenderness and myalgias.  Skin: Negative for color  change, rash, hair loss, skin tightness, ulcers and sensitivity to sunlight.  Allergic/Immunologic: Negative for susceptible to infections.  Neurological: Negative for dizziness, memory loss and night sweats.  Hematological: Negative for swollen glands.  Psychiatric/Behavioral: Negative for depressed mood and sleep disturbance. The patient is not nervous/anxious.     PMFS History:  Patient Active Problem List   Diagnosis Date Noted  . Primary osteoarthritis of both hands 08/31/2016  . Abnormal finding on MRI of brain 08/27/2016  . Rheumatoid arthritis involving multiple sites with positive rheumatoid factor ([place]) 08/26/2016  . Primary osteoarthritis of both feet 08/26/2016  . High risk medication use 08/26/2016  . HLA B27 (HLA B27 positive) 08/26/2016  . Other fatigue 08/26/2016  . Vitamin D deficiency 08/26/2016  . Elevated LFTs 08/26/2016  . History of anxiety 08/26/2016  . History of gastroesophageal reflux (GERD) 08/26/2016  . History of eating disorder 08/26/2016  . Early menopause 08/26/2016  . History of cholelithiasis 08/26/2016  . Gastroparesis 08/26/2016  . 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 Pain and swelling of right forearm   . Kidney stones 10/11/2012  . Chronic headaches 10/11/2012    Past Medical History:  Diagnosis Date  . Allergy   . Anemia   . Anorexia nervosa with bulimia 2004  . Arthritis   . Gastroparesis   . GERD (gastroesophageal reflux disease)   . Kidney stones   . Lupus   . Menopause   . Pain and swelling of right forearm     Family History  Problem Relation Age of Onset  . Arthritis Mother   . Hernia Mother   .  Cancer Mother        melanoma  . Hypertension Mother   . Diabetes Father   . Lupus Sister   . Diabetes Maternal Grandmother   . Cancer Maternal Grandfather   . Diabetes Paternal Grandfather   . Heart disease Paternal Grandfather   . Heart disease Paternal Grandmother   . Diabetes Other    Past Surgical  History:  Procedure Laterality Date  . ABDOMINAL HYSTERECTOMY     2x,2008-2009  . CARPAL TUNNEL RELEASE Right   . CHOLECYSTECTOMY    . CYSTOSCOPY WITH RETROGRADE PYELOGRAM, URETEROSCOPY AND STENT PLACEMENT Left 06/22/2015   Procedure: LEFT URETEROSCOPY AND STONE EXTRACTION ;  Surgeon: Kathie Rhodes, MD;  Location: [facility];  Service: Urology;  Laterality: Left;  . KIDNEY STONE SURGERY     5  . OVARIAN CYST SURGERY  2007   LAP   Social History   Social History Narrative   Married. Education: [employer]. Exercise 3 times 1 hour or so. Patient consumes 1 or 2 cups of coffee and does exercise.     Objective: Vital Signs: Pulse 68   Resp 14   Ht 5' 1"  (1.549 m)   Wt 138 lb (62.6 kg)   BMI 26.07 kg/m    Physical Exam  Constitutional: She is oriented to person, place, and time. She appears well-developed and well-nourished.  HENT:  Head: Normocephalic and atraumatic.  Eyes: Conjunctivae and EOM are normal.  Neck: Normal range of motion.  Cardiovascular: Normal rate, regular rhythm, normal heart sounds and intact distal pulses.   Pulmonary/Chest: Effort normal and breath sounds normal.  Abdominal: Soft. Bowel sounds are normal.  Lymphadenopathy:    She has no cervical adenopathy.  Neurological: She is alert and oriented to person, place, and time.  Skin: Skin is warm and dry. Capillary refill takes less than 2 seconds.  Psychiatric: She has a normal mood and affect. Her behavior is normal.  Nursing note and vitals reviewed.    Musculoskeletal Exam: C-spine and thoracic lumbar spine good range of motion. Shoulder joints although joints wrist joint MCPs PIPs DIPs with good range of motion with no synovitis. Hip joints knee joints ankles MTPs PIPs DIPs are good range of motion with no synovitis.  CDAI Exam: CDAI Homunculus Exam:   Joint Counts:  CDAI Tender Joint count: 0 CDAI Swollen Joint count: 0  Global Assessments:  Patient Global Assessment: 1 Provider Global Assessment:  1  CDAI Calculated Score: 2    Investigation: Findings:  In October of 2011, CBC, AST, ALT, creatinine, hepatitis, lupus anticoagulant, and UA were negative.  Her ANA was 1:320 nucleolar pattern.  C3 and C4 were normal.  Double stranded DNA was 170.  Beta 2 and anti-Cardiolipin were negative.      April 2017:  CBC was normal.  Comprehensive metabolic panel showed alkaline phosphatase 131, AST 33, ALT 67.  UA was negative.  Sed rate was 5.  ANA was 1:320 nucleolar pattern.  Double strand DNA was negative.  Complements were normal.  CCP was 33.  Vitamin D was low at 21.     CBC Latest Ref Rng & Units 08/05/2016 03/02/2016 06/22/2015  WBC 3.8 - 10.8 K/uL 12.6(H) 7.6 6.3  Hemoglobin 11.7 - 15.5 g/dL 13.0 12.3 12.5  Hematocrit 35.0 - 45.0 % 37.5 35.9(L) 36.7  Platelets 140 - 400 K/uL 295 311 265    CMP Latest Ref Rng & Units 08/05/2016 03/02/2016 10/12/2012  Glucose 65 - 99 mg/dL 81 98 92  BUN 7 - 25 mg/dL 15 <5(L) 17  Creatinine 0.50 - 1.10 mg/dL 0.64 0.58 0.70  Sodium 135 - 146 mmol/L 138 139 141  Potassium 3.5 - 5.3 mmol/L 4.5 3.3(L) 3.9  Chloride 98 - 110 mmol/L 104 110 107  CO2 20 - 31 mmol/L 24 24 -  Calcium 8.6 - 10.2 mg/dL 9.6 9.6 -  Total Protein 6.1 - 8.1 g/dL 7.2 6.7 -  Total Bilirubin 0.2 - 1.2 mg/dL 0.5 0.6 -  Alkaline Phos 33 - 115 U/L 155(H) 77 -  AST 10 - 30 U/L 40(H) 27 -  ALT 6 - 29 U/L 77(H) 30 -     Imaging: No results found.  Speciality Comments: No specialty comments available.    Procedures:  No procedures performed Allergies: Zofran [ondansetron hcl] and Metoclopramide   Assessment / Plan:     Visit Diagnoses: Rheumatoid arthritis involving multiple sites with positive rheumatoid factor (HCC) +CCP  - h/o +ds DNA which became negative.She's doing really well on Plaquenil. She has no synovitis on examination.  HLA B27 (HLA B27 positive)  High risk medication use - Plaquenil 200 mg by mouth twice a day Monday to Friday - Plan: CBC with  Differential/Platelet, COMPLETE METABOLIC PANEL WITH GFR we'll check labs today due to elevated LFTs and then monitor every 3 months.  Primary osteoarthritis of both hands - bilateral mild  Primary osteoarthritis of both feet - bilateral mild  Elevated LFTs: Unknown etiology. Patient denies any use of NSAIDs or alcohol.  Other fatigue  Vitamin D deficiency: On supplement  History of anxiety  History of cholelithiasis  History of gastroesophageal reflux (GERD)  Gastroparesis - f/u by GI    Orders: No orders of the defined types were placed in this encounter.  No orders of the defined types were placed in this encounter.   Face-to-face time spent with patient was 30 minutes. 50% of time was spent in counseling and coordination of care.  Follow-Up Instructions: Return in about 5 months (around 04/13/2017) for Rheumatoid arthritis.   Bo Merino, MD  Note - This record has been created using Editor, commissioning.  Chart creation errors have been sought, but may not always  have been located. Such creation errors do not reflect on  the standard of medical care.

## 2016-11-11 ENCOUNTER — Ambulatory Visit (INDEPENDENT_AMBULATORY_CARE_PROVIDER_SITE_OTHER): Payer: BC Managed Care – PPO | Admitting: Rheumatology

## 2016-11-11 ENCOUNTER — Encounter: Payer: Self-pay | Admitting: Rheumatology

## 2016-11-11 VITALS — HR 68 | Resp 14 | Ht 61.0 in | Wt 138.0 lb

## 2016-11-11 DIAGNOSIS — Z1589 Genetic susceptibility to other disease: Secondary | ICD-10-CM

## 2016-11-11 DIAGNOSIS — K3184 Gastroparesis: Secondary | ICD-10-CM

## 2016-11-11 DIAGNOSIS — R945 Abnormal results of liver function studies: Secondary | ICD-10-CM

## 2016-11-11 DIAGNOSIS — M19072 Primary osteoarthritis, left ankle and foot: Secondary | ICD-10-CM

## 2016-11-11 DIAGNOSIS — R5383 Other fatigue: Secondary | ICD-10-CM

## 2016-11-11 DIAGNOSIS — M0579 Rheumatoid arthritis with rheumatoid factor of multiple sites without organ or systems involvement: Secondary | ICD-10-CM | POA: Diagnosis not present

## 2016-11-11 DIAGNOSIS — M19041 Primary osteoarthritis, right hand: Secondary | ICD-10-CM | POA: Diagnosis not present

## 2016-11-11 DIAGNOSIS — Z8659 Personal history of other mental and behavioral disorders: Secondary | ICD-10-CM

## 2016-11-11 DIAGNOSIS — Z79899 Other long term (current) drug therapy: Secondary | ICD-10-CM

## 2016-11-11 DIAGNOSIS — E559 Vitamin D deficiency, unspecified: Secondary | ICD-10-CM

## 2016-11-11 DIAGNOSIS — R7989 Other specified abnormal findings of blood chemistry: Secondary | ICD-10-CM

## 2016-11-11 DIAGNOSIS — M19071 Primary osteoarthritis, right ankle and foot: Secondary | ICD-10-CM | POA: Diagnosis not present

## 2016-11-11 DIAGNOSIS — M19042 Primary osteoarthritis, left hand: Secondary | ICD-10-CM

## 2016-11-11 DIAGNOSIS — Z8719 Personal history of other diseases of the digestive system: Secondary | ICD-10-CM

## 2016-11-11 LAB — CBC WITH DIFFERENTIAL/PLATELET
BASOS ABS: 0 {cells}/uL (ref 0–200)
Basophils Relative: 0 %
EOS PCT: 2 %
Eosinophils Absolute: 134 cells/uL (ref 15–500)
HCT: 35.8 % (ref 35.0–45.0)
HEMOGLOBIN: 12.2 g/dL (ref 11.7–15.5)
LYMPHS PCT: 38 %
Lymphs Abs: 2546 cells/uL (ref 850–3900)
MCH: 29.3 pg (ref 27.0–33.0)
MCHC: 34.1 g/dL (ref 32.0–36.0)
MCV: 86.1 fL (ref 80.0–100.0)
MPV: 8.7 fL (ref 7.5–12.5)
Monocytes Absolute: 469 cells/uL (ref 200–950)
Monocytes Relative: 7 %
NEUTROS PCT: 53 %
Neutro Abs: 3551 cells/uL (ref 1500–7800)
Platelets: 287 10*3/uL (ref 140–400)
RBC: 4.16 MIL/uL (ref 3.80–5.10)
RDW: 13.6 % (ref 11.0–15.0)
WBC: 6.7 10*3/uL (ref 3.8–10.8)

## 2016-11-11 NOTE — Patient Instructions (Addendum)
Standing Labs We placed an order today for your standing lab work.    Please come back and get your standing labs in September and every 3 months  We have open lab Monday through Friday from 8:30-11:30 AM and 1:30-4 PM at the office of Dr. Tresa Moore, PA.   The office is located at [address] [place], [place], [place] [address] No appointment is necessary.   Labs are drawn by [facility].  You may receive a bill from [facility] for your lab work. If you have any questions regarding directions or hours of operation,  please call [phone].   Hand Exercises Hand exercises can be helpful to almost anyone. These exercises can strengthen the hands, improve flexibility and movement, and increase blood flow to the hands. These results can make work and daily tasks easier. Hand exercises can be especially helpful for people who have joint pain from arthritis or have nerve damage from overuse (carpal tunnel syndrome). These exercises can also help people who have injured a hand. Most of these hand exercises are fairly gentle stretching routines. You can do them often throughout the day. Still, it is a good idea to ask your health care provider which exercises would be best for you. Warming your hands before exercise may help to reduce stiffness. You can do this with gentle massage or by placing your hands in warm water for 15 minutes. Also, make sure you pay attention to your level of hand pain as you begin an exercise routine. Exercises Knuckle Bend Repeat this exercise 5-10 times with each hand. 1. Stand or sit with your arm, hand, and all five fingers pointed straight up. Make sure your wrist is straight. 2. Gently and slowly bend your fingers down and inward until the tips of your fingers are touching the tops of your palm. 3. Hold this position for a few seconds. 4. Extend your fingers out to their original position, all pointing straight up again.   Finger Fan Repeat this  exercise 5-10 times with each hand. 1. Hold your arm and hand out in front of you. Keep your wrist straight. 2. Squeeze your hand into a fist. 3. Hold this position for a few seconds. 4. Edison Simon out, or spread apart, your hand and fingers as much as possible, stretching every joint fully.  Tabletop Repeat this exercise 5-10 times with each hand. 1. Stand or sit with your arm, hand, and all five fingers pointed straight up. Make sure your wrist is straight. 2. Gently and slowly bend your fingers at the knuckles where they meet the hand until your hand is making an upside-down L shape. Your fingers should form a tabletop. 3. Hold this position for a few seconds. 4. Extend your fingers out to their original position, all pointing straight up again.  Making Os Repeat this exercise 5-10 times with each hand. 1. Stand or sit with your arm, hand, and all five fingers pointed straight up. Make sure your wrist is straight. 2. Make an O shape by touching your pointer finger to your thumb. Hold for a few seconds. Then open your hand wide. 3. Repeat this motion with each finger on your hand.  Table Spread Repeat this exercise 5-10 times with each hand. 1. Place your hand on a table with your palm facing down. Make sure your wrist is straight. 2. Spread your fingers out as much as possible. Hold this position for a few seconds. 3. Slide your fingers back together again. Hold for  a few seconds.  Ball Grip  Repeat this exercise 10-15 times with each hand. 1. Hold a tennis ball or another soft ball in your hand. 2. While slowly increasing pressure, squeeze the ball as hard as possible. 3. Squeeze as hard as you can for 3-5 seconds. 4. Relax and repeat.  Wrist Curls Repeat this exercise 10-15 times with each hand. 1. Sit in a chair that has armrests. 2. Hold a light weight in your hand, such as a dumbbell that weighs 1-3 pounds (0.5-1.4 kg). Ask your health care provider what weight would be best for  you. 3. Rest your hand just over the end of the chair arm with your palm facing up. 4. Gently pivot your wrist up and down while holding the weight. Do not twist your wrist from side to side.  Contact a health care provider if:  Your hand pain or discomfort gets much worse when you do an exercise.  Your hand pain or discomfort does not improve within 2 hours after you exercise. If you have any of these problems, stop doing these exercises right away. Do not do them again unless your health care provider says that you can. Get help right away if:  You develop sudden, severe hand pain. If this happens, stop doing these exercises right away. Do not do them again unless your health care provider says that you can. This information is not intended to replace advice given to you by your health care provider. Make sure you discuss any questions you have with your health care provider. Document Released: 04/16/2015 Document Revised: 10/11/2015 Document Reviewed: 11/13/2014 Elsevier Interactive Patient Education  [employer].

## 2016-11-11 NOTE — Progress Notes (Signed)
Rheumatology Medication Review by a Pharmacist Does the patient feel that his/her medications are working for him/her?  Yes Has the patient been experiencing any side effects to the medications prescribed?  No Does the patient have any problems obtaining medications?  No  Issues to address at subsequent visits: None   Pharmacist comments:  Debbie [name] is a pleasant 46 yo F who presents for follow up of rheumatoid arthritis.  She is currently taking hydroxychloroquine 200 mg BID Monday through Friday.  She is past due for standing labs today.  Will have labs drawn today and every 3 months.  Most recent hydroxychloroquine eye exam was on 09/20/15 which was normal.  She reports she had eye exam today and has to go back for the hydroxychloroquine exam on 12/12/16.  I gave her hydroxychloroquine eye exam form and asked her to have results faxed back to our office.  Patient denies any questions or concerns regarding her medications at this time.   Elisabeth Most, Pharm.D., BCPS, CPP Clinical Pharmacist Pager: [phone] Phone: [phone] 11/11/2016 3:57 PM

## 2016-11-12 LAB — COMPLETE METABOLIC PANEL WITH GFR
ALBUMIN: 3.9 g/dL (ref 3.6–5.1)
ALK PHOS: 180 U/L — AB (ref 33–115)
ALT: 126 U/L — ABNORMAL HIGH (ref 6–29)
AST: 48 U/L — AB (ref 10–30)
BUN: 12 mg/dL (ref 7–25)
CHLORIDE: 104 mmol/L (ref 98–110)
CO2: 23 mmol/L (ref 20–31)
Calcium: 9 mg/dL (ref 8.6–10.2)
Creat: 0.66 mg/dL (ref 0.50–1.10)
GFR, Est African American: >89 mL/min (ref >=60)
GFR, Est Non African American: >89 mL/min (ref >=60)
GLUCOSE: 76 mg/dL (ref 65–99)
POTASSIUM: 4.3 mmol/L (ref 3.5–5.3)
SODIUM: 138 mmol/L (ref 135–146)
Total Bilirubin: 0.3 mg/dL (ref 0.2–1.2)
Total Protein: 7 g/dL (ref 6.1–8.1)

## 2016-11-12 NOTE — Progress Notes (Signed)
ALT higher. She needs to see her GI.

## 2016-11-24 ENCOUNTER — Other Ambulatory Visit: Payer: Self-pay | Admitting: Obstetrics and Gynecology

## 2016-11-24 DIAGNOSIS — Z1231 Encounter for screening mammogram for malignant neoplasm of breast: Secondary | ICD-10-CM

## 2016-11-26 ENCOUNTER — Telehealth: Payer: Self-pay | Admitting: Rheumatology

## 2016-11-26 NOTE — Telephone Encounter (Signed)
Patient left a message to let us know that you know who her doctor is.  It is Dr. Lauretta Chester, tele [phone].  He is at [facility].

## 2017-02-09 ENCOUNTER — Other Ambulatory Visit: Payer: Self-pay | Admitting: Obstetrics and Gynecology

## 2017-02-09 ENCOUNTER — Ambulatory Visit: Payer: BC Managed Care – PPO

## 2017-02-19 ENCOUNTER — Ambulatory Visit: Payer: BC Managed Care – PPO

## 2017-03-05 ENCOUNTER — Ambulatory Visit: Payer: BC Managed Care – PPO

## 2017-03-25 ENCOUNTER — Telehealth: Payer: Self-pay

## 2017-03-25 NOTE — Telephone Encounter (Signed)
Patient called to see about labs being drawn

## 2017-04-04 NOTE — Progress Notes (Signed)
Office Visit Note  Patient: Debbie [name]             Date of Birth: Aug 06, 1970           MRN: [id number]             PCP: Mateo Flow, MD Referring: Mateo Flow, MD Visit Date: 04/14/2017 Occupation: _0 @    Subjective:  Other (BIL hand/ wrist pain, increased fatigue, toe redness )   History of Present Illness: Debbie [name] is a 46 y.o. female with history of some sero positive rheumatoid arthritis. She states she's been having increased pain and discomfort in her joints. She describes pain and discomfort in her bilateral wrist in bilateral hands. She also has discomfort in her toes. She sprained her left third and fourth finger which is causing increased discomfort. She's been also experiencing increased fatigue. She states her job is very stressful. She had to miss work today  due to increased pain and discomfort.  Activities of Daily Living:  Patient reports morning stiffness for 30 minutes.   Patient Reports nocturnal pain.  Difficulty dressing/grooming: Denies Difficulty climbing stairs: Denies Difficulty getting out of chair: Reports Difficulty using hands for taps, buttons, cutlery, and/or writing: Denies   Review of Systems  Constitutional: Positive for fatigue. Negative for night sweats, weight gain, weight loss and weakness.  HENT: Negative for mouth sores, trouble swallowing, trouble swallowing, mouth dryness and nose dryness.   Eyes: Positive for dryness. Negative for pain, redness and visual disturbance.  Respiratory: Negative for cough, shortness of breath and difficulty breathing.   Cardiovascular: Negative for chest pain, palpitations, hypertension, irregular heartbeat and swelling in legs/feet.  Gastrointestinal: Negative for blood in stool, constipation and diarrhea.  Endocrine: Negative for increased urination.  Genitourinary: Negative for vaginal dryness.  Musculoskeletal: Positive for arthralgias, joint pain and morning stiffness.  Negative for joint swelling, myalgias, muscle weakness, muscle tenderness and myalgias.  Skin: Negative for color change, rash, hair loss, skin tightness, ulcers and sensitivity to sunlight.  Allergic/Immunologic: Negative for susceptible to infections.  Neurological: Negative for dizziness, memory loss and night sweats.  Hematological: Negative for swollen glands.  Psychiatric/Behavioral: Positive for sleep disturbance. Negative for depressed mood. The patient is not nervous/anxious.     PMFS History:  Patient Active Problem List   Diagnosis Date Noted  . Primary osteoarthritis of both hands 08/31/2016  . Abnormal finding on MRI of brain 08/27/2016  . Rheumatoid arthritis involving multiple sites with positive rheumatoid factor ([place]) 08/26/2016  . Primary osteoarthritis of both feet 08/26/2016  . High risk medication use 08/26/2016  . HLA B27 (HLA B27 positive) 08/26/2016  . Other fatigue 08/26/2016  . Vitamin D deficiency 08/26/2016  . Elevated LFTs 08/26/2016  . History of anxiety 08/26/2016  . History of gastroesophageal reflux (GERD) 08/26/2016  . History of eating disorder 08/26/2016  . Early menopause 08/26/2016  . History of cholelithiasis 08/26/2016  . Gastroparesis 08/26/2016  . 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 Pain and swelling of right forearm   . Kidney stones 10/11/2012  . Chronic headaches 10/11/2012    Past Medical History:  Diagnosis Date  . Allergy   . Anemia   . Anorexia nervosa with bulimia 2004  . Arthritis   . Gastroparesis   . GERD (gastroesophageal reflux disease)   . Kidney stones   . Lupus   . Menopause   . Pain and swelling of right forearm     Family History  Problem Relation Age of Onset  . Arthritis Mother        rheumatoid   . Hernia Mother   . Hypertension Mother   . Heart attack Mother   . Diabetes Father   . Lupus Sister   . Diabetes Maternal Grandmother   . Cancer Maternal Grandfather   . Diabetes Paternal  Grandfather   . Heart disease Paternal Grandfather   . Rheum arthritis Daughter   . Hashimoto's thyroiditis Daughter   . Heart disease Paternal Grandmother   . Diabetes Other    Past Surgical History:  Procedure Laterality Date  . ABDOMINAL HYSTERECTOMY     2x,2008-2009  . CARPAL TUNNEL RELEASE Right   . CHOLECYSTECTOMY    . CYSTOSCOPY WITH RETROGRADE PYELOGRAM, URETEROSCOPY AND STENT PLACEMENT Left 06/22/2015   Procedure: LEFT URETEROSCOPY AND STONE EXTRACTION ;  Surgeon: Kathie Rhodes, MD;  Location: [facility];  Service: Urology;  Laterality: Left;  . KIDNEY STONE SURGERY     6  . OVARIAN CYST SURGERY  2007   LAP   Social History   Social History Narrative   Married. Education: [employer]. Exercise 3 times 1 hour or so. Patient consumes 1 or 2 cups of coffee and does exercise.     Objective: Vital Signs: BP 122/78 (BP Location: Left Arm, Patient Position: Sitting, Cuff Size: Normal)   Pulse 73   Resp 16   Ht _0  (1.549 m)   Wt 143 lb (64.9 kg)   BMI 27.02 kg/m    Physical Exam  Constitutional: She is oriented to person, place, and time. She appears well-developed and well-nourished.  HENT:  Head: Normocephalic and atraumatic.  Eyes: Conjunctivae and EOM are normal.  Neck: Normal range of motion.  Cardiovascular: Normal rate, regular rhythm, normal heart sounds and intact distal pulses.  Pulmonary/Chest: Effort normal and breath sounds normal.  Abdominal: Soft. Bowel sounds are normal.  Lymphadenopathy:    She has no cervical adenopathy.  Neurological: She is alert and oriented to person, place, and time.  Skin: Skin is warm and dry. Capillary refill takes less than 2 seconds.  Psychiatric: She has a normal mood and affect. Her behavior is normal.  Nursing note and vitals reviewed.    Musculoskeletal Exam: C-spine and thoracic lumbar spine good range of motion. Shoulder joints although joints wrist joint MCPs PIPs DIPs are good range of motion with no synovitis. She has  DIP PIP thickening in her hands and feet consistent with osteoarthritis. Hip joints knee joints ankles MTPs PIPs were all good range of motion with no synovitis.  CDAI Exam: CDAI Homunculus Exam:   Joint Counts:  CDAI Tender Joint count: 0 CDAI Swollen Joint count: 0  Global Assessments:  Patient Global Assessment: 5 Provider Global Assessment: 1  CDAI Calculated Score: 6    Investigation: No additional findings.PLQ eye exam: 12/12/2016 CBC Latest Ref Rng & Units 11/11/2016 08/05/2016 03/02/2016  WBC 3.8 - 10.8 K/uL 6.7 12.6(H) 7.6  Hemoglobin 11.7 - 15.5 g/dL 12.2 13.0 12.3  Hematocrit 35.0 - 45.0 % 35.8 37.5 35.9(L)  Platelets 140 - 400 K/uL 287 295 311   CMP Latest Ref Rng & Units 11/11/2016 08/05/2016 03/02/2016  Glucose 65 - 99 mg/dL 76 81 98  BUN 7 - 25 mg/dL 12 15 <5(L)  Creatinine 0.50 - 1.10 mg/dL 0.66 0.64 0.58  Sodium 135 - 146 mmol/L 138 138 139  Potassium 3.5 - 5.3 mmol/L 4.3 4.5 3.3(L)  Chloride 98 - 110 mmol/L 104 104 110  CO2 20 - 31 mmol/L _0 Calcium 8.6 - 10.2 mg/dL 9.0 9.6 9.6  Total Protein 6.1 - 8.1 g/dL 7.0 7.2 6.7  Total Bilirubin 0.2 - 1.2 mg/dL 0.3 0.5 0.6  Alkaline Phos 33 - 115 U/L 180(H) 155(H) 77  AST 10 - 30 U/L 48(H) 40(H) 27  ALT 6 - 29 U/L 126(H) 77(H) 30    Imaging: No results found.  Speciality Comments: No specialty comments available.    Procedures:  No procedures performed Allergies: Zofran [ondansetron hcl] and Metoclopramide   Assessment / Plan:     Visit Diagnoses: Rheumatoid arthritis involving multiple sites with positive rheumatoid factor ([place]) - h/o +ds DNA which became negative. She has no synovitis on examination she complains of increase arthralgias.  HLA B27 (HLA B27 positive)  High risk medication use - Plaquenil 200 mg by mouth twice a day Monday to Friday.PLQ eye exam: 12/12/2016 - Plan: CBC with Differential/Platelet, COMPLETE METABOLIC PANEL WITH GFR today and then every 5 months. She was also given a  Plaquenil eye exam for next year.  Primary osteoarthritis of both hands: She does have osteoarthritis in her bilateral hands which causes ongoing pain and discomfort.  Primary osteoarthritis of both feet: She's been having increased pain in her feet due to underlying osteoarthritis and wearing closed shoes. Proper fitting shoes were discussed at length.  Elevated LFTs - Unknown etiology. Patient denies any use of NSAIDs or alcohol. We'll check her labs today .  Other fatigue: She relates her fatigue to her stressful job.  Vitamin D deficiency - On supplement  History of gastroesophageal reflux (GERD)  History of cholelithiasis  History of anxiety  Gastroparesis - f/u by GI   High risk medication use - Plaquenil 200 mg by mouth twice a day Monday to Friday - Plan: CBC with Differential/Platelet, COMPLETE METABOLIC PANEL WITH GFR    Orders: No orders of the defined types were placed in this encounter.  No orders of the defined types were placed in this encounter.    Follow-Up Instructions: Return in about 5 months (around 09/12/2017) for Rheumatoid arthritis, Osteoarthritis.   Bo Merino, MD  Note - This record has been created using Editor, commissioning.  Chart creation errors have been sought, but may not always  have been located. Such creation errors do not reflect on  the standard of medical care.

## 2017-04-14 ENCOUNTER — Encounter: Payer: Self-pay | Admitting: Rheumatology

## 2017-04-14 ENCOUNTER — Ambulatory Visit: Payer: BC Managed Care – PPO | Admitting: Rheumatology

## 2017-04-14 VITALS — BP 122/78 | HR 73 | Resp 16 | Ht 61.0 in | Wt 143.0 lb

## 2017-04-14 DIAGNOSIS — M19041 Primary osteoarthritis, right hand: Secondary | ICD-10-CM

## 2017-04-14 DIAGNOSIS — Z8719 Personal history of other diseases of the digestive system: Secondary | ICD-10-CM | POA: Diagnosis not present

## 2017-04-14 DIAGNOSIS — M19071 Primary osteoarthritis, right ankle and foot: Secondary | ICD-10-CM

## 2017-04-14 DIAGNOSIS — Z79899 Other long term (current) drug therapy: Secondary | ICD-10-CM | POA: Diagnosis not present

## 2017-04-14 DIAGNOSIS — R5383 Other fatigue: Secondary | ICD-10-CM

## 2017-04-14 DIAGNOSIS — K3184 Gastroparesis: Secondary | ICD-10-CM

## 2017-04-14 DIAGNOSIS — M0579 Rheumatoid arthritis with rheumatoid factor of multiple sites without organ or systems involvement: Secondary | ICD-10-CM | POA: Diagnosis not present

## 2017-04-14 DIAGNOSIS — Z8659 Personal history of other mental and behavioral disorders: Secondary | ICD-10-CM | POA: Diagnosis not present

## 2017-04-14 DIAGNOSIS — E559 Vitamin D deficiency, unspecified: Secondary | ICD-10-CM | POA: Diagnosis not present

## 2017-04-14 DIAGNOSIS — Z1589 Genetic susceptibility to other disease: Secondary | ICD-10-CM

## 2017-04-14 DIAGNOSIS — M19042 Primary osteoarthritis, left hand: Secondary | ICD-10-CM | POA: Diagnosis not present

## 2017-04-14 DIAGNOSIS — R7989 Other specified abnormal findings of blood chemistry: Secondary | ICD-10-CM

## 2017-04-14 DIAGNOSIS — M19072 Primary osteoarthritis, left ankle and foot: Secondary | ICD-10-CM

## 2017-04-14 DIAGNOSIS — R945 Abnormal results of liver function studies: Secondary | ICD-10-CM

## 2017-04-14 LAB — COMPLETE METABOLIC PANEL WITH GFR
AG Ratio: 1.4 (calc) (ref 1.0–2.5)
ALKALINE PHOSPHATASE (APISO): 133 U/L — AB (ref 33–115)
ALT: 64 U/L — AB (ref 6–29)
AST: 58 U/L — AB (ref 10–35)
Albumin: 4.1 g/dL (ref 3.6–5.1)
BUN: 11 mg/dL (ref 7–25)
CALCIUM: 9.3 mg/dL (ref 8.6–10.2)
CO2: 27 mmol/L (ref 20–32)
CREATININE: 0.71 mg/dL (ref 0.50–1.10)
Chloride: 104 mmol/L (ref 98–110)
GFR, Est African American: 119 mL/min/{1.73_m2} (ref >=60)
GFR, Est Non African American: 103 mL/min/{1.73_m2} (ref >=60)
GLOBULIN: 2.9 g/dL (ref 1.9–3.7)
GLUCOSE: 74 mg/dL (ref 65–99)
Potassium: 4.1 mmol/L (ref 3.5–5.3)
SODIUM: 137 mmol/L (ref 135–146)
Total Bilirubin: 0.3 mg/dL (ref 0.2–1.2)
Total Protein: 7 g/dL (ref 6.1–8.1)

## 2017-04-14 LAB — CBC WITH DIFFERENTIAL/PLATELET
BASOS PCT: 0.6 %
Basophils Absolute: 47 cells/uL (ref 0–200)
EOS ABS: 148 {cells}/uL (ref 15–500)
Eosinophils Relative: 1.9 %
HCT: 36.9 % (ref 35.0–45.0)
Hemoglobin: 12.8 g/dL (ref 11.7–15.5)
LYMPHS ABS: 2870 {cells}/uL (ref 850–3900)
MCH: 29.5 pg (ref 27.0–33.0)
MCHC: 34.7 g/dL (ref 32.0–36.0)
MCV: 85 fL (ref 80.0–100.0)
MPV: 9.5 fL (ref 7.5–12.5)
Monocytes Relative: 7.9 %
Neutro Abs: 4118 cells/uL (ref 1500–7800)
Neutrophils Relative %: 52.8 %
PLATELETS: 316 10*3/uL (ref 140–400)
RBC: 4.34 10*6/uL (ref 3.80–5.10)
RDW: 12.6 % (ref 11.0–15.0)
Total Lymphocyte: 36.8 %
WBC: 7.8 10*3/uL (ref 3.8–10.8)
WBCMIX: 616 {cells}/uL (ref 200–950)

## 2017-04-14 NOTE — Patient Instructions (Signed)
Standing Labs We placed an order today for your standing lab work.    Please come back and get your standing labs in April and every 5 months  We have open lab Monday through Friday from 8:30-11:30 AM and 1:30-4 PM at the office of Dr. Bo Merino.   The office is located at [address], [place], [place], [place] [address] No appointment is necessary.   Labs are drawn by [facility].  You may receive a bill from [facility] for your lab work. If you have any questions regarding directions or hours of operation,  please call [phone].

## 2017-04-15 NOTE — Progress Notes (Signed)
Her LFTs are better. Please notify patient. She has a gastroenterologist. She can discuss elevated LFTs with her GI.

## 2017-05-19 DIAGNOSIS — G709 Myoneural disorder, unspecified: Secondary | ICD-10-CM

## 2017-05-19 HISTORY — DX: Myoneural disorder, unspecified: G70.9

## 2017-09-07 ENCOUNTER — Other Ambulatory Visit: Payer: Self-pay | Admitting: Rheumatology

## 2017-09-07 NOTE — Progress Notes (Signed)
Office Visit Note  Patient: Debbie [name]             Date of Birth: 03-07-1971           MRN: [id number]             PCP: Mateo Flow, MD Referring: Mateo Flow, MD Visit Date: 09/17/2017 Occupation: _0 @    Subjective:  Medication Management   History of Present Illness: Debbie [name] is a 47 y.o. female with history of rheumatoid arthritis and osteoarthritis overlap.  She states she has been doing quite well as regards to her rheumatoid arthritis.  She denies any joint swelling.  She had recently moved to [facility] where she has a new job.  She states her job is less stressful.  At the place she is a staying she has to climb stairs.  She states she is been experiencing some pain in her left hip.  Activities of Daily Living:  Patient reports morning stiffness for 10 minutes.   Patient Denies nocturnal pain.  Difficulty dressing/grooming: Denies Difficulty climbing stairs: Reports Difficulty getting out of chair: Denies Difficulty using hands for taps, buttons, cutlery, and/or writing: Denies   Review of Systems  Constitutional: Positive for fatigue. Negative for night sweats, weight gain and weight loss.  HENT: Positive for mouth dryness. Negative for mouth sores, trouble swallowing, trouble swallowing and nose dryness.   Eyes: Positive for dryness. Negative for pain, redness and visual disturbance.  Respiratory: Negative for cough, shortness of breath and difficulty breathing.   Cardiovascular: Negative for chest pain, palpitations, hypertension, irregular heartbeat and swelling in legs/feet.  Gastrointestinal: Negative for blood in stool, constipation and diarrhea.  Endocrine: Negative for increased urination.  Genitourinary: Negative for vaginal dryness.  Musculoskeletal: Positive for arthralgias, joint pain and morning stiffness. Negative for joint swelling, myalgias, muscle weakness, muscle tenderness and myalgias.  Skin: Negative for color  change, rash, hair loss, skin tightness, ulcers and sensitivity to sunlight.  Allergic/Immunologic: Negative for susceptible to infections.  Neurological: Negative for dizziness, memory loss, night sweats and weakness.  Hematological: Negative for swollen glands.  Psychiatric/Behavioral: Negative for depressed mood and sleep disturbance. The patient is not nervous/anxious.     PMFS History:  Patient Active Problem List   Diagnosis Date Noted  . Primary osteoarthritis of both hands 08/31/2016  . Abnormal finding on MRI of brain 08/27/2016  . Rheumatoid arthritis involving multiple sites with positive rheumatoid factor ([place]) 08/26/2016  . Primary osteoarthritis of both feet 08/26/2016  . High risk medication use 08/26/2016  . HLA B27 (HLA B27 positive) 08/26/2016  . Other fatigue 08/26/2016  . Vitamin D deficiency 08/26/2016  . Elevated LFTs 08/26/2016  . History of anxiety 08/26/2016  . History of gastroesophageal reflux (GERD) 08/26/2016  . History of eating disorder 08/26/2016  . Early menopause 08/26/2016  . History of cholelithiasis 08/26/2016  . Gastroparesis 08/26/2016  . 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 Pain and swelling of right forearm   . Kidney stones 10/11/2012  . Chronic headaches 10/11/2012    Past Medical History:  Diagnosis Date  . Allergy   . Anemia   . Anorexia nervosa with bulimia 2004  . Arthritis   . Gastroparesis   . GERD (gastroesophageal reflux disease)   . Kidney stones   . Lupus ([place])   . Menopause   . Pain and swelling of right forearm     Family History  Problem Relation Age of Onset  .  Arthritis Mother        rheumatoid   . Hernia Mother   . Hypertension Mother   . Heart attack Mother   . Diabetes Father   . Lupus Sister   . Diabetes Maternal Grandmother   . Cancer Maternal Grandfather   . Diabetes Paternal Grandfather   . Heart disease Paternal Grandfather   . Rheum arthritis Daughter   . Hashimoto's  thyroiditis Daughter   . Heart disease Paternal Grandmother   . Diabetes Other    Past Surgical History:  Procedure Laterality Date  . ABDOMINAL HYSTERECTOMY     2x,2008-2009  . CARPAL TUNNEL RELEASE Right   . CHOLECYSTECTOMY    . CYSTOSCOPY WITH RETROGRADE PYELOGRAM, URETEROSCOPY AND STENT PLACEMENT Left 06/22/2015   Procedure: LEFT URETEROSCOPY AND STONE EXTRACTION ;  Surgeon: Kathie Rhodes, MD;  Location: [facility];  Service: Urology;  Laterality: Left;  . KIDNEY STONE SURGERY     6  . OVARIAN CYST SURGERY  2007   LAP   Social History   Social History Narrative   Married. Education: [employer]. Exercise 3 times 1 hour or so. Patient consumes 1 or 2 cups of coffee and does exercise.     Objective: Vital Signs: BP 124/79 (BP Location: Left Arm, Patient Position: Sitting, Cuff Size: Normal)   Pulse 82   Resp 12   Ht 5' 1" (1.549 m)   Wt 152 lb (68.9 kg)   BMI 28.72 kg/m    Physical Exam  Constitutional: She is oriented to person, place, and time. She appears well-developed and well-nourished.  HENT:  Head: Normocephalic and atraumatic.  Eyes: Conjunctivae and EOM are normal.  Neck: Normal range of motion.  Cardiovascular: Normal rate, regular rhythm, normal heart sounds and intact distal pulses.  Pulmonary/Chest: Effort normal and breath sounds normal.  Abdominal: Soft. Bowel sounds are normal.  Lymphadenopathy:    She has no cervical adenopathy.  Neurological: She is alert and oriented to person, place, and time.  Skin: Skin is warm and dry. Capillary refill takes less than 2 seconds.  Psychiatric: She has a normal mood and affect. Her behavior is normal.  Nursing note and vitals reviewed.    Musculoskeletal Exam: Cervical, thoracic, lumbar spine good range of motion.  Shoulder joints, elbow joints, wrist joints, MCPs PIPs DIPs been good range of motion with no synovitis.  Hip joints knee joints ankles MTPs PIPs DIPs but good range of motion.  CDAI Exam: CDAI Homunculus  Exam:   Joint Counts:  CDAI Tender Joint count: 0 CDAI Swollen Joint count: 0  Global Assessments:  Patient Global Assessment: 3 Provider Global Assessment: 3  CDAI Calculated Score: 6    Investigation: No additional findings.PLQ eye exam: 12/12/2016 CBC Latest Ref Rng & Units 04/14/2017 11/11/2016 08/05/2016  WBC 3.8 - 10.8 Thousand/uL 7.8 6.7 12.6(H)  Hemoglobin 11.7 - 15.5 g/dL 12.8 12.2 13.0  Hematocrit 35.0 - 45.0 % 36.9 35.8 37.5  Platelets 140 - 400 Thousand/uL 316 287 295   CMP Latest Ref Rng & Units 04/14/2017 11/11/2016 08/05/2016  Glucose 65 - 99 mg/dL 74 76 81  BUN 7 - 25 mg/dL _0 Creatinine 0.50 - 1.10 mg/dL 0.71 0.66 0.64  Sodium 135 - 146 mmol/L 137 138 138  Potassium 3.5 - 5.3 mmol/L 4.1 4.3 4.5  Chloride 98 - 110 mmol/L 104 104 104  CO2 20 - 32 mmol/L _1 Calcium 8.6 - 10.2 mg/dL 9.3 9.0 9.6  Total Protein 6.1 -  8.1 g/dL 7.0 7.0 7.2  Total Bilirubin 0.2 - 1.2 mg/dL 0.3 0.3 0.5  Alkaline Phos 33 - 115 U/L - 180(H) 155(H)  AST 10 - 35 U/L 58(H) 48(H) 40(H)  ALT 6 - 29 U/L 64(H) 126(H) 77(H)    Imaging: No results found.  Speciality Comments: No specialty comments available.    Procedures:  No procedures performed Allergies: Zofran [ondansetron hcl] and Metoclopramide   Assessment / Plan:     Visit Diagnoses: Rheumatoid arthritis involving multiple sites with positive rheumatoid factor ([place]) - h/o +ds DNA which became negative.  Patient is clinically doing well without any synovitis.  She is tolerating Plaquenil well.  HLA B27 (HLA B27 positive)  High risk medication use - Plaquenil 200 mg by mouth twice a day Monday to Friday.PLQ eye exam: 12/12/2016.  Her eye exam is due in July again.  We will schedule labs again.  Primary osteoarthritis of both hands-joint protection muscle strengthening discussed.  Primary osteoarthritis of both feet-she is not having much discomfort in her feet currently.  Elevated LFTs-she had GI evaluation for  elevated LFTs.  Other fatigue-she continues to have a lot of fatigue.  She relates it to insomnia.  History of vitamin D deficiency-I will check her vitamin D level today.  Gastroparesis -  f/u by GI   History of anxiety  History of gastroesophageal reflux (GERD)  History of cholelithiasis    Orders: Orders Placed This Encounter  Procedures  . CBC with Differential/Platelet  . COMPLETE METABOLIC PANEL WITH GFR  . VITAMIN D 25 Hydroxy (Vit-D Deficiency, Fractures)   Meds ordered this encounter  Medications  . hydroxychloroquine (PLAQUENIL) 200 MG tablet    Sig: TAKE 1 TABLET EACH MORNING AND 1/2 TABLET AT BEDTIME    Dispense:  135 tablet    Refill:  0    Face-to-face time spent with patient was 30 minutes.> 50% of time was spent in counseling and coordination of care.  Follow-Up Instructions: Return in about 5 months (around 02/17/2018) for Rheumatoid arthritis, Osteoarthritis.   Bo Merino, MD  Note - This record has been created using Editor, commissioning.  Chart creation errors have been sought, but may not always  have been located. Such creation errors do not reflect on  the standard of medical care.

## 2017-09-08 NOTE — Telephone Encounter (Signed)
Last visit: 11/27/ Next visit: 09/17/17 Labs: 04/14/17 Her LFTs are better. PLQ Eye Exam: 12/12/16 WNL  Okay to refill per Dr. Estanislado Pandy

## 2017-09-17 ENCOUNTER — Encounter: Payer: Self-pay | Admitting: Rheumatology

## 2017-09-17 ENCOUNTER — Ambulatory Visit: Payer: BC Managed Care – PPO | Admitting: Rheumatology

## 2017-09-17 VITALS — BP 124/79 | HR 82 | Resp 12 | Ht 61.0 in | Wt 152.0 lb

## 2017-09-17 DIAGNOSIS — R945 Abnormal results of liver function studies: Secondary | ICD-10-CM | POA: Diagnosis not present

## 2017-09-17 DIAGNOSIS — Z1589 Genetic susceptibility to other disease: Secondary | ICD-10-CM

## 2017-09-17 DIAGNOSIS — R5383 Other fatigue: Secondary | ICD-10-CM | POA: Diagnosis not present

## 2017-09-17 DIAGNOSIS — M19042 Primary osteoarthritis, left hand: Secondary | ICD-10-CM

## 2017-09-17 DIAGNOSIS — Z8639 Personal history of other endocrine, nutritional and metabolic disease: Secondary | ICD-10-CM

## 2017-09-17 DIAGNOSIS — Z79899 Other long term (current) drug therapy: Secondary | ICD-10-CM

## 2017-09-17 DIAGNOSIS — Z8659 Personal history of other mental and behavioral disorders: Secondary | ICD-10-CM

## 2017-09-17 DIAGNOSIS — M19041 Primary osteoarthritis, right hand: Secondary | ICD-10-CM | POA: Diagnosis not present

## 2017-09-17 DIAGNOSIS — M0579 Rheumatoid arthritis with rheumatoid factor of multiple sites without organ or systems involvement: Secondary | ICD-10-CM

## 2017-09-17 DIAGNOSIS — M19072 Primary osteoarthritis, left ankle and foot: Secondary | ICD-10-CM

## 2017-09-17 DIAGNOSIS — K3184 Gastroparesis: Secondary | ICD-10-CM

## 2017-09-17 DIAGNOSIS — Z8719 Personal history of other diseases of the digestive system: Secondary | ICD-10-CM

## 2017-09-17 DIAGNOSIS — R7989 Other specified abnormal findings of blood chemistry: Secondary | ICD-10-CM

## 2017-09-17 DIAGNOSIS — M19071 Primary osteoarthritis, right ankle and foot: Secondary | ICD-10-CM | POA: Diagnosis not present

## 2017-09-17 LAB — CBC WITH DIFFERENTIAL/PLATELET
Basophils Absolute: 39 cells/uL (ref 0–200)
Basophils Relative: 0.5 %
Eosinophils Absolute: 187 cells/uL (ref 15–500)
Eosinophils Relative: 2.4 %
HCT: 35 % (ref 35.0–45.0)
HEMOGLOBIN: 12.3 g/dL (ref 11.7–15.5)
Lymphs Abs: 2387 cells/uL (ref 850–3900)
MCH: 29.6 pg (ref 27.0–33.0)
MCHC: 35.1 g/dL (ref 32.0–36.0)
MCV: 84.3 fL (ref 80.0–100.0)
MPV: 9.3 fL (ref 7.5–12.5)
Monocytes Relative: 9.9 %
Neutro Abs: 4415 cells/uL (ref 1500–7800)
Neutrophils Relative %: 56.6 %
Platelets: 303 10*3/uL (ref 140–400)
RBC: 4.15 10*6/uL (ref 3.80–5.10)
RDW: 12.4 % (ref 11.0–15.0)
TOTAL LYMPHOCYTE: 30.6 %
WBC mixed population: 772 cells/uL (ref 200–950)
WBC: 7.8 10*3/uL (ref 3.8–10.8)

## 2017-09-17 LAB — COMPLETE METABOLIC PANEL WITH GFR
AG RATIO: 1.4 (calc) (ref 1.0–2.5)
ALBUMIN MSPROF: 4.2 g/dL (ref 3.6–5.1)
ALKALINE PHOSPHATASE (APISO): 122 U/L — AB (ref 33–115)
ALT: 50 U/L — ABNORMAL HIGH (ref 6–29)
AST: 38 U/L — AB (ref 10–35)
BUN: 12 mg/dL (ref 7–25)
CO2: 25 mmol/L (ref 20–32)
CREATININE: 0.65 mg/dL (ref 0.50–1.10)
Calcium: 9.6 mg/dL (ref 8.6–10.2)
Chloride: 107 mmol/L (ref 98–110)
GFR, EST NON AFRICAN AMERICAN: 107 mL/min/{1.73_m2} (ref >=60)
GFR, Est African American: 124 mL/min/{1.73_m2} (ref >=60)
Globulin: 3 g/dL (calc) (ref 1.9–3.7)
Glucose, Bld: 80 mg/dL (ref 65–99)
POTASSIUM: 4.1 mmol/L (ref 3.5–5.3)
Sodium: 138 mmol/L (ref 135–146)
Total Bilirubin: 0.3 mg/dL (ref 0.2–1.2)
Total Protein: 7.2 g/dL (ref 6.1–8.1)

## 2017-09-17 MED ORDER — HYDROXYCHLOROQUINE SULFATE 200 MG PO TABS: ORAL_TABLET | ORAL | 0 refills | Status: AC

## 2017-09-18 LAB — VITAMIN D 25 HYDROXY (VIT D DEFICIENCY, FRACTURES): Vit D, 25-Hydroxy: 20 ng/mL — ABNORMAL LOW (ref 30–100)

## 2017-09-18 NOTE — Progress Notes (Signed)
Please call in vitamin D 50,000 units once a week for 3 months.  Repeat vitamin D in 3 months.  LFTs are better but still elevated.  Please forward labs to her PCP and GI.

## 2017-09-21 ENCOUNTER — Telehealth: Payer: Self-pay | Admitting: *Deleted

## 2017-09-21 DIAGNOSIS — E559 Vitamin D deficiency, unspecified: Secondary | ICD-10-CM

## 2017-09-21 MED ORDER — VITAMIN D (ERGOCALCIFEROL) 1.25 MG (50000 UNIT) PO CAPS: 50000.0000 [IU] | ORAL_CAPSULE | ORAL | 0 refills | Status: DC

## 2017-09-21 NOTE — Telephone Encounter (Signed)
-----   Message from Bo Merino, MD sent at 09/18/2017  9:24 AM EDT ----- Please call in vitamin D 50,000 units once a week for 3 months.  Repeat vitamin D in 3 months.  LFTs are better but still elevated.  Please forward labs to her PCP and GI.

## 2017-10-06 IMAGING — CT CT ANGIO HEAD
3 of 11 series · 17 of 47 positions shown · IV contrast (ISOVUE 370)
Comparison: MRI 08/17/2016

CLINICAL DATA: Abnormal MRI.  Vascular lesion right frontal lobe

EXAM:
CT ANGIOGRAPHY HEAD
TECHNIQUE: Multidetector CT imaging of the head was performed using the
standard protocol during bolus administration of intravenous
contrast. Multiplanar CT image reconstructions and MIPs were
obtained to evaluate the vascular anatomy.
CONTRAST:  100 mL Isovue 370 IV

[Series 7: ax thin · axial · 0.41mm/px · z∈[+1453,+1584]mm · 12 of 155 slices shown]
[im 12/155  brain]
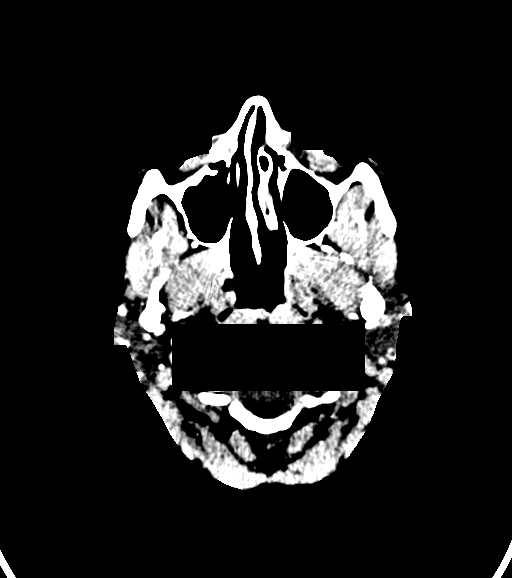
[im 23/155  bone]
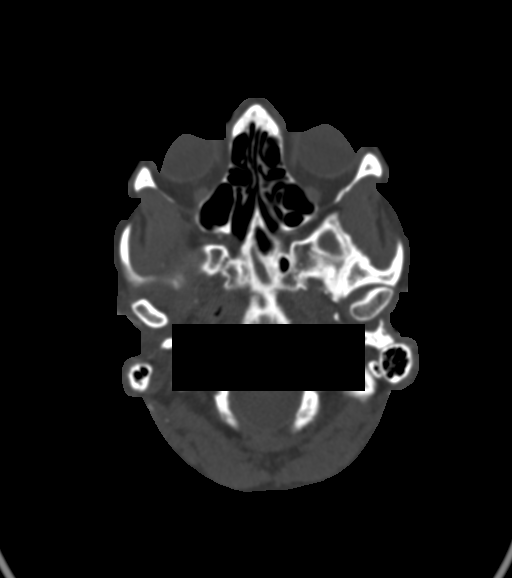
[im 34/155  brain]
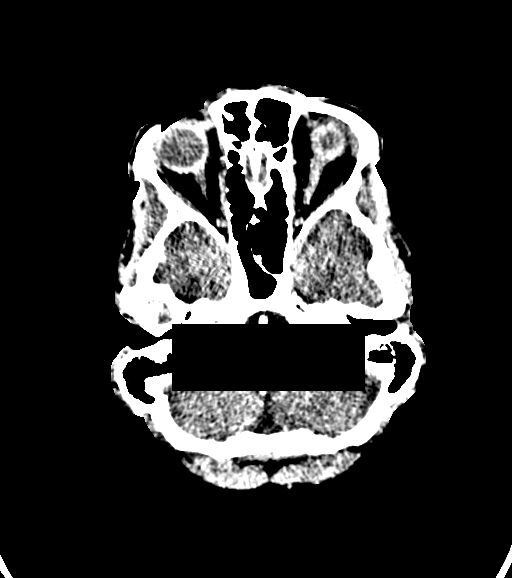
[im 45/155  bone]
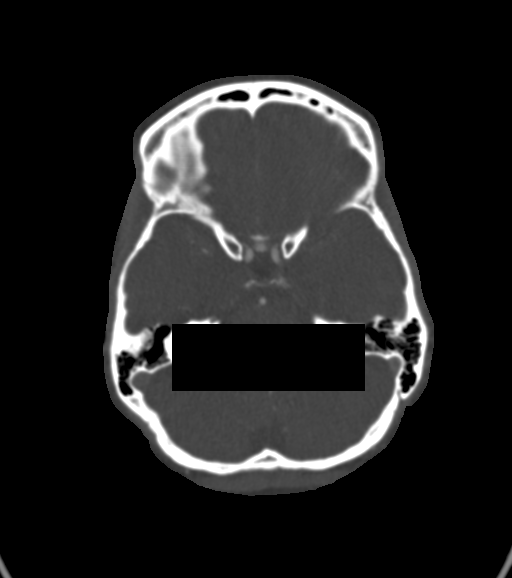
[im 56/155  brain]
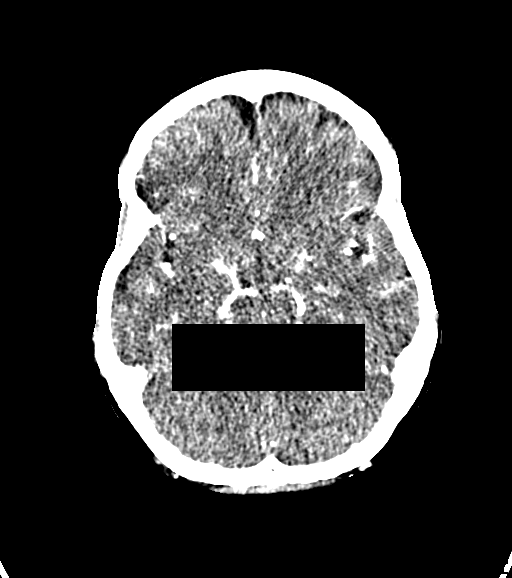
[im 67/155  bone]
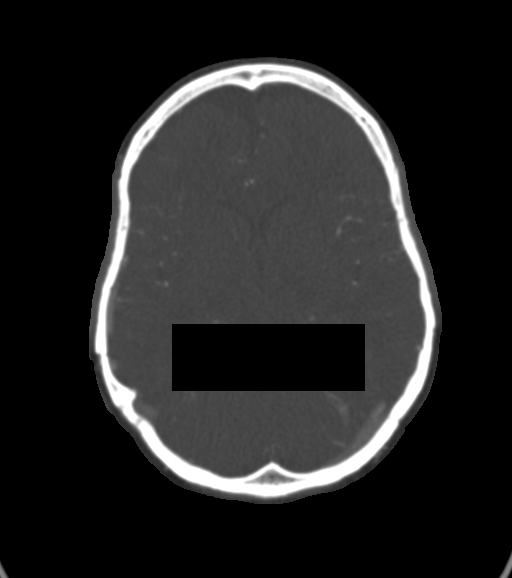
[im 89/155  brain]
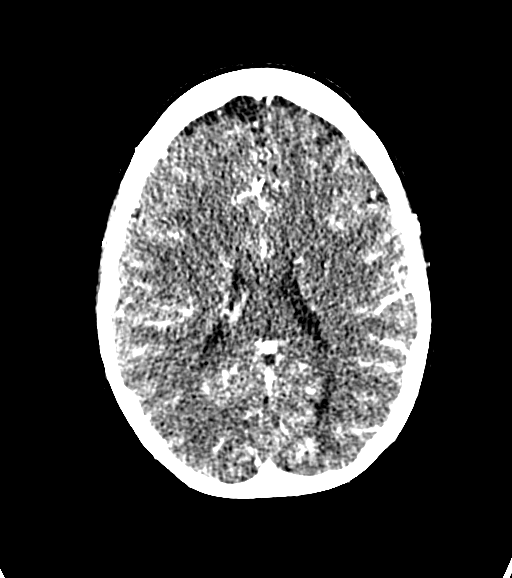
[im 100/155  bone]
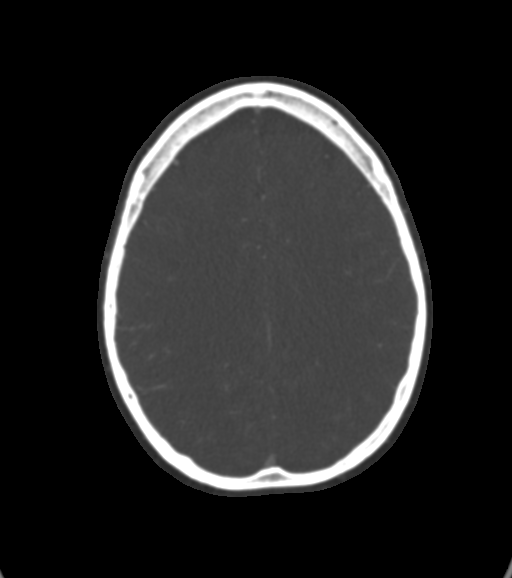
[im 111/155  brain]
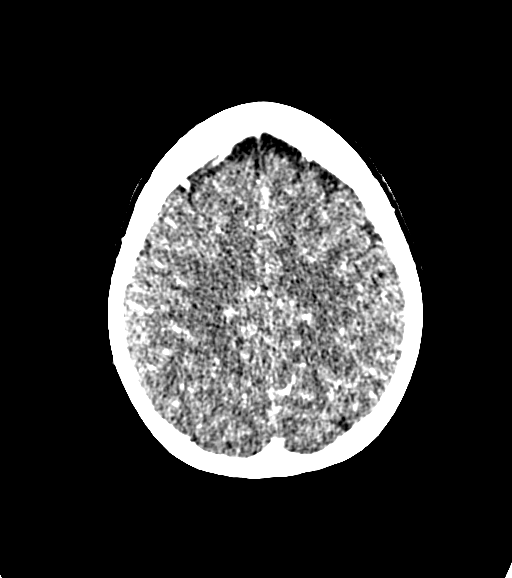
[im 122/155  bone]
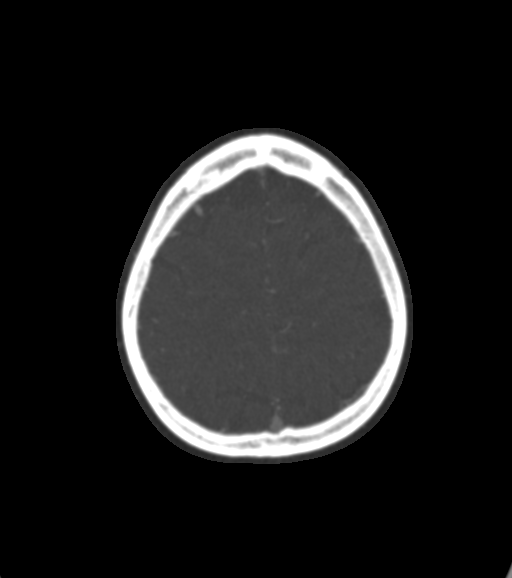
[im 133/155  brain]
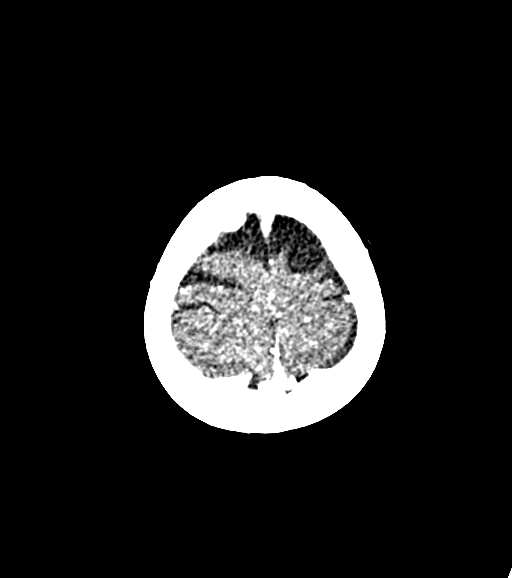
[im 144/155  bone]
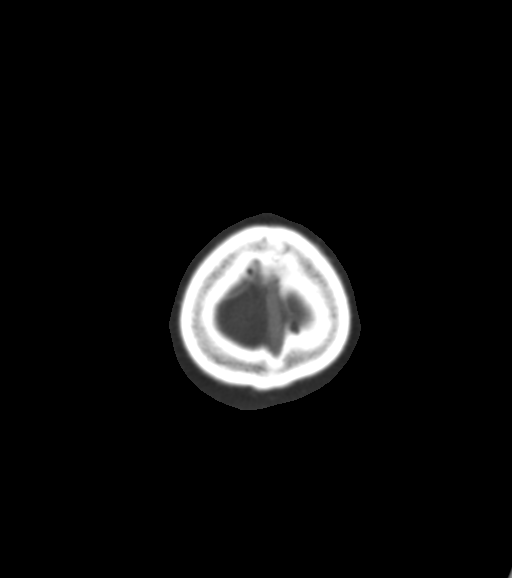

[Series 9: cor thin · coronal · 0.38mm/px · 3 of 191 slices shown]
[im 64/191  brain]
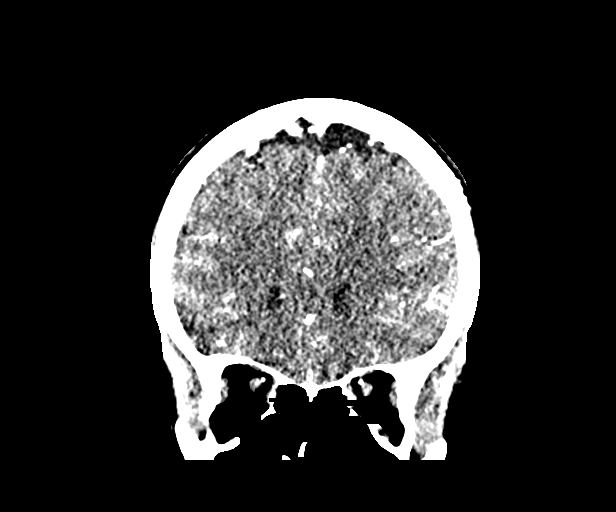
[im 96/191  brain]
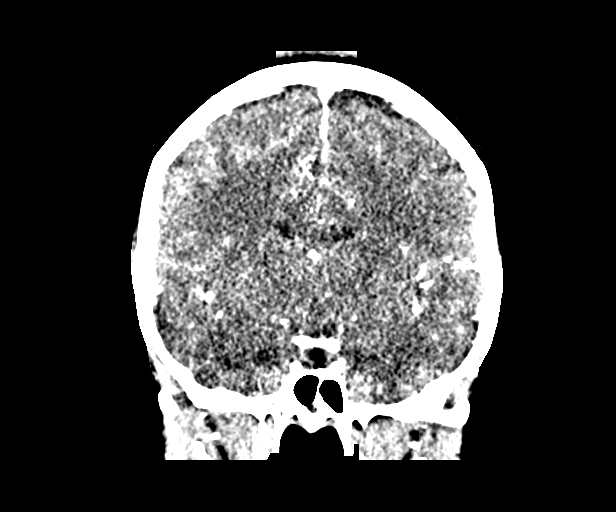
[im 127/191  brain]
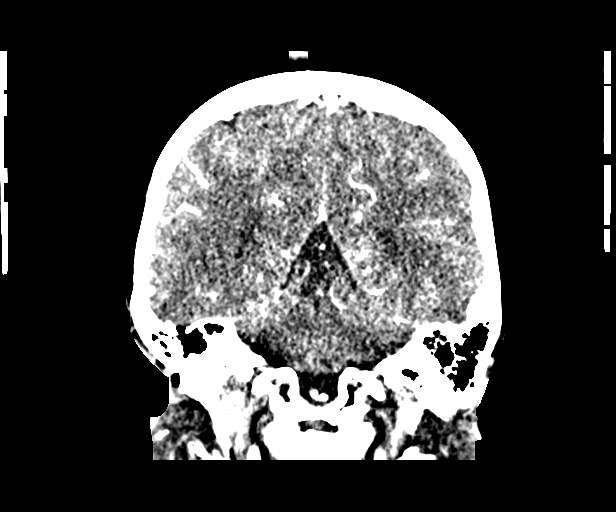

[Series 11: sag thin · sagittal · 0.43mm/px · 2 of 146 slices shown]
[im 49/146  brain]
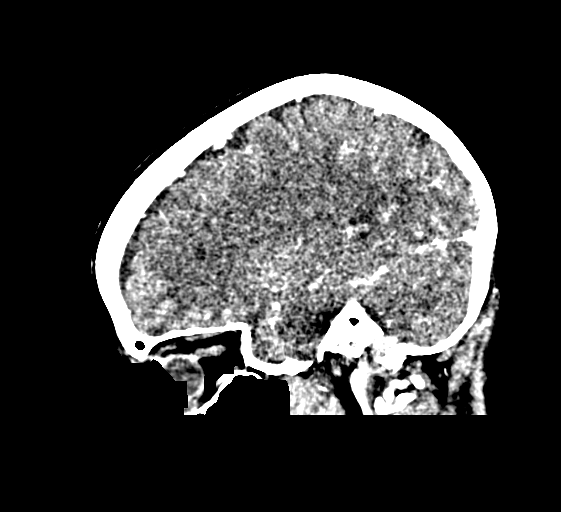
[im 97/146  brain]
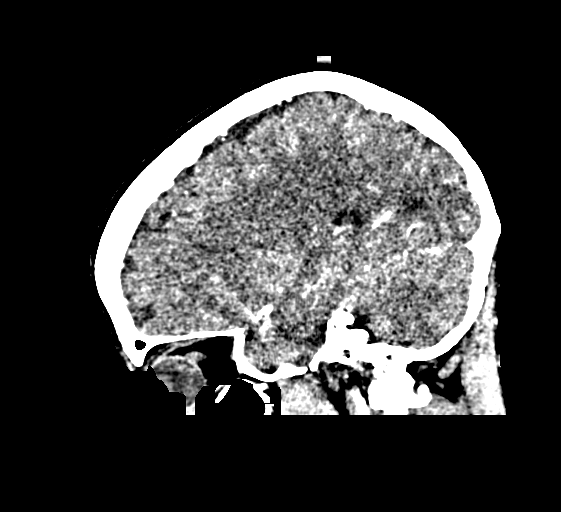

[17 of 47 positions shown; findings below may reference images not displayed]

FINDINGS: CT HEAD

Brain: Chronic hemorrhagic lesion in the right inferior frontal lobe
just above the orbital roof is not seen on unenhanced CT images.
This is noted on MRI. The lesion does show mild enhancement
postcontrast administration. Ventricle size normal. No acute
hemorrhage, mass, or edema.

Vascular: Negative for hyperdense vessel

Skull: Negative

Sinuses: Negative

Orbits: Negative

CTA HEAD

Anterior circulation: Cavernous carotid widely patent bilaterally
without stenosis or aneurysm. Anterior and middle cerebral arteries
are normal bilaterally without stenosis. No abnormal vascularity in
the right frontal lobe. No draining vein or AVM
Identified.

Posterior circulation: Both vertebral arteries are patent to the
basilar normal. Basilar widely patent. PICA, superior cerebellar,
and posterior cerebral arteries normal bilaterally.

Venous sinuses: Normal enhancement of the dural sinuses without
thrombus or occlusion

Anatomic variants: None

Delayed phase: Mild enhancement in the right inferior frontal lobe
measuring approximately 8 mm. This corresponds to the chronic
hemorrhage on MRI and is most consistent with a cavernoma. This
lesion also shows mild enhancement on MRI. No surrounding edema or
hemorrhage.
IMPRESSION: Small cavernoma right frontal lobe. No abnormal vascularity is
present in the area. Otherwise negative CTA head

## 2017-12-25 ENCOUNTER — Other Ambulatory Visit: Payer: Self-pay

## 2017-12-25 DIAGNOSIS — E559 Vitamin D deficiency, unspecified: Secondary | ICD-10-CM

## 2017-12-26 LAB — VITAMIN D 25 HYDROXY (VIT D DEFICIENCY, FRACTURES): VIT D 25 HYDROXY: 33 ng/mL (ref 30–100)

## 2017-12-28 NOTE — Progress Notes (Signed)
He may take vitamin D 2000 units every day or 50,000 units once a month.

## 2018-02-16 NOTE — Progress Notes (Signed)
Office Visit Note  Patient: Debbie [name]             Date of Birth: 03/04/1971           MRN: [id number]             PCP: Mateo Flow, MD Referring: Mateo Flow, MD Visit Date: 03/02/2018 Occupation: _0 @  Subjective:  Right ankle pain   History of Present Illness: Debbie [name] is a 47 y.o. female with history of seropositive rheumatoid arthritis and osteoarthritis.  She is on PLQ 200 mg 1 tablet in the morning and 1/2 tablet in the evening M-F.  Patient reports that since 02/11/2018 she has been having pain in her right knee and right ankle joint.  She states that the pain started in her right knee for several days later to her right ankle.  She states that she is having swelling in the right ankle.  She states that she has been on 2 rounds of prednisone which provided temporary relief.  Her last prednisone taper ended on Saturday.  She presents today with right ankle pain and swelling.  She denies any injuries or falls.  She denies any changes in activity recently.  She reports that she is been evaluated by to urgent cares as well as primary care.  She had an x-ray at the second urgent care that she went to which she says was normal.  She reports that her white blood cell count was also normal at that time.  She was also negative for strep and influenza.  She reports that over the summer she has had multiple tick bites.  She denies any rashes.  She states that she had a fever off and on while she was having the right knee and ankle pain.  She has not had any recent fevers.  She has not been on antibiotics recently.  She denies missing any doses of Plaquenil recently.  She denies any other joint pain or joint swelling at this time.    Activities of Daily Living:  Patient reports morning stiffness for 30  minutes.   Patient Reports nocturnal pain.  Difficulty dressing/grooming: Denies Difficulty climbing stairs: Reports Difficulty getting out of chair: Denies Difficulty  using hands for taps, buttons, cutlery, and/or writing: Denies  Review of Systems  Constitutional: Positive for fatigue.  HENT: Positive for mouth sores. Negative for mouth dryness and nose dryness.   Eyes: Positive for dryness. Negative for pain and visual disturbance.  Respiratory: Negative for cough, hemoptysis, shortness of breath and difficulty breathing.   Cardiovascular: Negative for chest pain, palpitations, hypertension and swelling in legs/feet.  Gastrointestinal: Positive for diarrhea. Negative for blood in stool and constipation.  Endocrine: Negative for increased urination.  Genitourinary: Negative for painful urination.  Musculoskeletal: Positive for arthralgias, joint pain, joint swelling and morning stiffness. Negative for myalgias, muscle weakness, muscle tenderness and myalgias.  Skin: Negative for color change, pallor, rash, hair loss, nodules/bumps, skin tightness, ulcers and sensitivity to sunlight.  Allergic/Immunologic: Negative for susceptible to infections.  Neurological: Negative for dizziness, numbness, headaches and weakness.  Hematological: Negative for swollen glands.  Psychiatric/Behavioral: Negative for depressed mood and sleep disturbance. The patient is not nervous/anxious.     PMFS History:  Patient Active Problem List   Diagnosis Date Noted  . Primary osteoarthritis of both hands 08/31/2016  . Abnormal finding on MRI of brain 08/27/2016  . Rheumatoid arthritis involving multiple sites with positive rheumatoid factor ([place]) 08/26/2016  .  Primary osteoarthritis of both feet 08/26/2016  . High risk medication use 08/26/2016  . HLA B27 (HLA B27 positive) 08/26/2016  . Other fatigue 08/26/2016  . Vitamin D deficiency 08/26/2016  . Elevated LFTs 08/26/2016  . History of anxiety 08/26/2016  . History of gastroesophageal reflux (GERD) 08/26/2016  . History of eating disorder 08/26/2016  . Early menopause 08/26/2016  . History of cholelithiasis 08/26/2016    . Gastroparesis 08/26/2016  . 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 Pain and swelling of right forearm   . Kidney stones 10/11/2012  . Chronic headaches 10/11/2012    Past Medical History:  Diagnosis Date  . Allergy   . Anemia   . Anorexia nervosa with bulimia 2004  . Arthritis   . Gastroparesis   . GERD (gastroesophageal reflux disease)   . Kidney stones   . Lupus ([place])   . Menopause   . Pain and swelling of right forearm     Family History  Problem Relation Age of Onset  . Arthritis Mother        rheumatoid   . Hernia Mother   . Hypertension Mother   . Heart attack Mother   . Diabetes Father   . Lupus Sister   . Diabetes Maternal Grandmother   . Cancer Maternal Grandfather   . Diabetes Paternal Grandfather   . Heart disease Paternal Grandfather   . Rheum arthritis Daughter   . Hashimoto's thyroiditis Daughter   . Heart disease Paternal Grandmother   . Diabetes Other    Past Surgical History:  Procedure Laterality Date  . ABDOMINAL HYSTERECTOMY     2x,2008-2009  . CARPAL TUNNEL RELEASE Right   . CHOLECYSTECTOMY    . CYSTOSCOPY WITH RETROGRADE PYELOGRAM, URETEROSCOPY AND STENT PLACEMENT Left 06/22/2015   Procedure: LEFT URETEROSCOPY AND STONE EXTRACTION ;  Surgeon: Kathie Rhodes, MD;  Location: [facility];  Service: Urology;  Laterality: Left;  . KIDNEY STONE SURGERY     5  . OVARIAN CYST SURGERY  2007   LAP   Social History   Social History Narrative   Married. Education: [employer]. Exercise 3 times 1 hour or so. Patient consumes 1 or 2 cups of coffee and does exercise.    Objective: Vital Signs: BP 116/74 (BP Location: Left Arm, Patient Position: Sitting, Cuff Size: Normal)   Pulse 72   Resp 12   Ht _0  (1.549 m)   Wt 147 lb 6.4 oz (66.9 kg)   BMI 27.85 kg/m    Physical Exam  Constitutional: She is oriented to person, place, and time. She appears well-developed and well-nourished.  HENT:  Head: Normocephalic and atraumatic.  Eyes:  Conjunctivae and EOM are normal.  Neck: Normal range of motion.  Cardiovascular: Normal rate, regular rhythm, normal heart sounds and intact distal pulses.  Pulmonary/Chest: Effort normal and breath sounds normal.  Abdominal: Soft. Bowel sounds are normal.  Lymphadenopathy:    She has no cervical adenopathy.  Neurological: She is alert and oriented to person, place, and time.  Skin: Skin is warm and dry. Capillary refill takes less than 2 seconds.  No rash noted.  Psychiatric: She has a normal mood and affect. Her behavior is normal.  Nursing note and vitals reviewed.    Musculoskeletal Exam: C-spine, thoracic spine, lumbar spine good range of motion.  No midline spinal tenderness.  No SI joint tenderness.  Shoulder joints, elbow joints, wrist joints, MCPs, PIPs, DIPs good range of motion with no synovitis.  She has complete  fist formation bilaterally.  She has right third and fourth PIP joint tenderness.  No synovitis noted.  Hip joints good range of motion with no discomfort.  She has tenderness of bilateral trochanter bursa.  Knee joints good range of motion with no discomfort.  No warmth or effusion noted.  She has tenderness along the right ankle joint line.  No ecchymosis or erythema noted.  She has good range of motion of the right ankle on exam.  No tenderness of MTPs.  She has PIP and DIP synovial thickening consistent with zoster arthritis  CDAI Exam: CDAI Score: 0.2  Patient Global Assessment: 1 (mm); Provider Global Assessment: 1 (mm) Swollen: 0 ; Tender: 0  Joint Exam   Not documented   There is currently no information documented on the homunculus. Go to the Rheumatology activity and complete the homunculus joint exam.  Investigation: No additional findings.  Imaging: No results found.  Recent Labs: Lab Results  Component Value Date   WBC 7.8 09/17/2017   HGB 12.3 09/17/2017   PLT 303 09/17/2017   NA 138 09/17/2017   K 4.1 09/17/2017   CL 107 09/17/2017   CO2 25  09/17/2017   GLUCOSE 80 09/17/2017   BUN 12 09/17/2017   CREATININE 0.65 09/17/2017   BILITOT 0.3 09/17/2017   ALKPHOS 180 (H) 11/11/2016   AST 38 (H) 09/17/2017   ALT 50 (H) 09/17/2017   PROT 7.2 09/17/2017   ALBUMIN 3.9 11/11/2016   CALCIUM 9.6 09/17/2017   GFRAA 124 09/17/2017    Speciality Comments: PLQ eye exam: [facility] 12/12/16 at [facility]  Procedures:  No procedures performed Allergies: Zofran [ondansetron hcl] and Metoclopramide   Assessment / Plan:     Visit Diagnoses: Rheumatoid arthritis involving multiple sites with positive rheumatoid factor ([place]) -  h/o +ds DNA which became negative: She has no synovitis on exam.  She has tenderness along the right ankle joint line.  No erythema, ecchymosis, or apparent joint swelling was noted.  She has not missed any doses of Plaquenil recently.  She continues to take Plaquenil 200 mg 1 tablet in the morning and half tablet in the evening.  She has been evaluated at 2 urgent cares as well as her primary care office.  According to the patient she had an x-ray which was normal as well as blood work that came back normal including negative strep, negative influenza, WBC count was within normal limits, and uric acid within desirable range.  She reports that she has been bite by about a dozen ticks this summer. She denies any rashes but developed a fever around the same time she was having right knee and right ankle pain.    She has been on 2 rounds of prednisone which provided temporary relief. She has not been on antibiotics, but the fevers resolved.  She declined a right knee cortisone injection today.  She declined wanting to schedule a right ankle ultrasound-guided injection.  She will continue on Plaquenil 200 mg 1 tablet in the morning half tablet in the evening.  The following labs will be checked today.We will call her with lab results.  She was advised to notify us if she develops any new or worsening joint pain.  HLA B27 (HLA B27  positive)  High risk medication use - PLQ 200 mg 1 tablet po BID M-F: CBC and CMP are drawn today to monitor for drug toxicity.- Plan: CBC with Differential/Platelet, COMPLETE METABOLIC PANEL WITH GFR  Primary osteoarthritis of both  hands: She has complete fist formation.  No synovitis noted.  Joint protection and muscle strengthening were discussed.   Primary osteoarthritis of both feet: She has PIP and DIP synovial thickening consistent with osteoarthritis of both feet.  She has no tenderness or synovitis.    Tick bite, subsequent encounter - She reports about a dozen tick bites this past summer.  No rash noted.  She had a fever off and on for a couple of weeks.  The following labs will be checked today.  Plan: B. burgdorfi antibodies, Rocky mtn spotted fvr abs pnl(IgG+IgM), Sedimentation rate, Epstein-Barr virus VCA antibody panel  Pain in joint of right ankle -She has been having right knee and right ankle pain and intermittent joint swelling since 02/11/18.  She has been on 2 prednisone tapers which provided temporary relief.  She declined wanting to schedule an ultrasound guided cortisone injection.  A prescription for voltaren gel was provided to the patient.  Plan: B. burgdorfi antibodies, Rocky mtn spotted fvr abs pnl(IgG+IgM), Sedimentation rate, Epstein-Barr virus VCA antibody panel  Chronic pain of right knee -No warmth or effusion noted.  Good ROM.  No erythema or ecchymosis noted.  No mechanical symptoms. She declined a cortisone injection.  A prescription for voltaren gel was sent to the pharmacy.  Plan: B. burgdorfi antibodies, Rocky mtn spotted fvr abs pnl(IgG+IgM), Sedimentation rate, Epstein-Barr virus VCA antibody panel  Trochanteric bursitis of both hips: She has tenderness of bilateral trochanteric bursa bilaterally. She was given a handout of exercises that she can perform at home. She declined a cortisone injection today.   Ischial bursitis of left side: She has tenderness in  the left ischial bursa region.  We discussed using a cushion when riding in the car for prolonged periods of time.  We discussed changing positions more frequently.   Other medical conditions are listed as follows:   Elevated LFTs - Followed by GI  Other fatigue  History of vitamin D deficiency  Gastroparesis  History of anxiety  History of gastroesophageal reflux (GERD)  History of cholelithiasis   Orders: Orders Placed This Encounter  Procedures  . CBC with Differential/Platelet  . COMPLETE METABOLIC PANEL WITH GFR  . B. burgdorfi antibodies  . Rocky mtn spotted fvr abs pnl(IgG+IgM)  . Sedimentation rate  . Epstein-Barr virus VCA antibody panel   Meds ordered this encounter  Medications  . diclofenac sodium (VOLTAREN) 1 % GEL    Sig: Apply 3 grams to 3 large joints up to 3 times daily.    Dispense:  3 Tube    Refill:  3    Face-to-face time spent with patient was 30 minutes. Greater than 50% of time was spent in counseling and coordination of care.  Follow-Up Instructions: Return in about 5 months (around 08/01/2018) for Rheumatoid arthritis, Osteoarthritis.   Ofilia Neas, PA-C  Note - This record has been created using Dragon software.  Chart creation errors have been sought, but may not always  have been located. Such creation errors do not reflect on  the standard of medical care.

## 2018-02-17 ENCOUNTER — Telehealth: Payer: Self-pay | Admitting: Rheumatology

## 2018-02-17 NOTE — Telephone Encounter (Signed)
Patient called stating last Thursday 9/26 her right knee began to swell and by Friday afternoon it got so bad she had to go to urgent care.  Patient states she did not fall or injure her knee.  Patient was given a Toradol and Steroid injection, as well as prescribed Prednisone 10 mg x 4 pills per day for 4 days, Hydrocodone, and Naproxyn.  Patient states she had a low grade fever over the weekend and now her right ankle has begun to swell.  Patient requested a return call to let her know if there is anything else Dr. Estanislado Pandy would recommend.

## 2018-02-17 NOTE — Telephone Encounter (Signed)
Per Dr. Estanislado Pandy patient should have evaluation by PCP for fever,chills and sore throat. Left message to advise patient to follow up with PCP or fever,chills and sore throat. Once resolved if she is still have trouble with ankle and knee we will schedule an appointment to evaluate.

## 2018-02-17 NOTE — Telephone Encounter (Signed)
Attempted to contact the patient and left message for patient to call the office.  

## 2018-02-17 NOTE — Telephone Encounter (Signed)
Patient states she is having trouble with her right knee and her right ankle swelling as well as pain. Patient states she was seen in the urgent care on Friday for the right knee. Patient states she was given Prednisone, Hydrocodone, Naproxen, and Toradol injection. Patient states she has not had a fall or and injury. Patient states now her ankle is more swollen than her knee. Patient states she is running a fever of 100.1, patient states she has chills and sore throat. Patient states she is able to bare weight at this time on her knee and ankle.  Patient has completed the Prednisone that was given by urgent care without improvement. Patient is on PLQ 1 daily. Please advise.

## 2018-02-18 ENCOUNTER — Telehealth: Payer: Self-pay | Admitting: Rheumatology

## 2018-02-18 NOTE — Telephone Encounter (Signed)
Notes have been received and reviewed with Dr. Estanislado Pandy. Her temperature at 4:15 pm was 99

## 2018-02-18 NOTE — Telephone Encounter (Signed)
Patient called stating she went to her PCP yesterday and she tested negative for flu, strept, gout, and her WBC was good.  Patient states she had a fever of 103 deg and because her test results were negative and she still had swelling in knee and ankle, her PCP recommended she call and schedule with Dr. Estanislado Pandy.  Patient is scheduled for tomorrow 02/19/18 at 9:15 am.

## 2018-02-18 NOTE — Telephone Encounter (Signed)
Left message to advise patient per Dr. Estanislado Pandy she will need to go back to PCP or to urgent care for evaluation as she is running a fever of 103.

## 2018-02-18 NOTE — Telephone Encounter (Signed)
Patient returned Andrea's call stating she she will check her temperature after class at 3:00 pm and call the office with the results.  Patient states she is scheduled to see Dr. Estanislado Pandy tomorrow at 9:15 and wants to know if she needs to cancel it.  Patient states she has already been to two urgent cares in [place] [facility]) and both have told her to see Dr. Estanislado Pandy.  Patient will have them faxe their notes to our office.

## 2018-02-18 NOTE — Telephone Encounter (Signed)
Patient reports that she started having right knee joint swelling on Monday.  She went to the urgent care where she was given Toradol and prednisone pack.  She states the swelling went down in her knee but then the swelling started in her right ankle joint.  She went back to the urgent care where she was given another course of prednisone.  At that time they also found that her temperature was 103.  She states she took her temperature this evening it was 99 degrees.  She would like to come in tomorrow for her appointment.

## 2018-02-18 NOTE — Progress Notes (Deleted)
Office Visit Note  Patient: Debbie [name]             Date of Birth: Dec 22, 1970           MRN: [id number]             PCP: Mateo Flow, MD Referring: Mateo Flow, MD Visit Date: 02/19/2018 Occupation: @GUAROCC @  Subjective:  No chief complaint on file.   History of Present Illness: Debbie [name] is a 47 y.o. female ***   Activities of Daily Living:  Patient reports morning stiffness for *** {minute/hour:19697}.   Patient {ACTIONS;DENIES/REPORTS:21021675::"Denies"} nocturnal pain.  Difficulty dressing/grooming: {ACTIONS;DENIES/REPORTS:21021675::"Denies"} Difficulty climbing stairs: {ACTIONS;DENIES/REPORTS:21021675::"Denies"} Difficulty getting out of chair: {ACTIONS;DENIES/REPORTS:21021675::"Denies"} Difficulty using hands for taps, buttons, cutlery, and/or writing: {ACTIONS;DENIES/REPORTS:21021675::"Denies"}  No Rheumatology ROS completed.   PMFS History:  Patient Active Problem List   Diagnosis Date Noted  . Primary osteoarthritis of both hands 08/31/2016  . Abnormal finding on MRI of brain 08/27/2016  . Rheumatoid arthritis involving multiple sites with positive rheumatoid factor ([place]) 08/26/2016  . Primary osteoarthritis of both feet 08/26/2016  . High risk medication use 08/26/2016  . HLA B27 (HLA B27 positive) 08/26/2016  . Other fatigue 08/26/2016  . Vitamin D deficiency 08/26/2016  . Elevated LFTs 08/26/2016  . History of anxiety 08/26/2016  . History of gastroesophageal reflux (GERD) 08/26/2016  . History of eating disorder 08/26/2016  . Early menopause 08/26/2016  . History of cholelithiasis 08/26/2016  . Gastroparesis 08/26/2016  . 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 Pain and swelling of right forearm   . Kidney stones 10/11/2012  . Chronic headaches 10/11/2012    Past Medical History:  Diagnosis Date  . Allergy   . Anemia   . Anorexia nervosa with bulimia 2004  . Arthritis   . Gastroparesis   . GERD  (gastroesophageal reflux disease)   . Kidney stones   . Lupus ([place])   . Menopause   . Pain and swelling of right forearm     Family History  Problem Relation Age of Onset  . Arthritis Mother        rheumatoid   . Hernia Mother   . Hypertension Mother   . Heart attack Mother   . Diabetes Father   . Lupus Sister   . Diabetes Maternal Grandmother   . Cancer Maternal Grandfather   . Diabetes Paternal Grandfather   . Heart disease Paternal Grandfather   . Rheum arthritis Daughter   . Hashimoto's thyroiditis Daughter   . Heart disease Paternal Grandmother   . Diabetes Other    Past Surgical History:  Procedure Laterality Date  . ABDOMINAL HYSTERECTOMY     2x,2008-2009  . CARPAL TUNNEL RELEASE Right   . CHOLECYSTECTOMY    . CYSTOSCOPY WITH RETROGRADE PYELOGRAM, URETEROSCOPY AND STENT PLACEMENT Left 06/22/2015   Procedure: LEFT URETEROSCOPY AND STONE EXTRACTION ;  Surgeon: Kathie Rhodes, MD;  Location: [facility];  Service: Urology;  Laterality: Left;  . KIDNEY STONE SURGERY     5  . OVARIAN CYST SURGERY  2007   LAP   Social History   Social History Narrative   Married. Education: [employer]. Exercise 3 times 1 hour or so. Patient consumes 1 or 2 cups of coffee and does exercise.    Objective: Vital Signs: There were no vitals taken for this visit.   Physical Exam   Musculoskeletal Exam: ***  CDAI Exam: CDAI Score: Not documented Patient Global Assessment: Not documented; Provider Global Assessment: Not  documented Swollen: Not documented; Tender: Not documented Joint Exam   Not documented   There is currently no information documented on the homunculus. Go to the Rheumatology activity and complete the homunculus joint exam.  Investigation: No additional findings.  Imaging: No results found.  Recent Labs: Lab Results  Component Value Date   WBC 7.8 09/17/2017   HGB 12.3 09/17/2017   PLT 303 09/17/2017   NA 138 09/17/2017   K 4.1 09/17/2017   CL 107 09/17/2017    CO2 25 09/17/2017   GLUCOSE 80 09/17/2017   BUN 12 09/17/2017   CREATININE 0.65 09/17/2017   BILITOT 0.3 09/17/2017   ALKPHOS 180 (H) 11/11/2016   AST 38 (H) 09/17/2017   ALT 50 (H) 09/17/2017   PROT 7.2 09/17/2017   ALBUMIN 3.9 11/11/2016   CALCIUM 9.6 09/17/2017   GFRAA 124 09/17/2017    Speciality Comments: PLQ eye exam: [facility] 12/12/16 [facility]  Procedures:  No procedures performed Allergies: Zofran [ondansetron hcl] and Metoclopramide   Assessment / Plan:     Visit Diagnoses: High risk medication use - Plan: CBC with Differential/Platelet, COMPLETE METABOLIC PANEL WITH GFR   Orders: No orders of the defined types were placed in this encounter.  No orders of the defined types were placed in this encounter.   Face-to-face time spent with patient was *** minutes. Greater than 50% of time was spent in counseling and coordination of care.  Follow-Up Instructions: No follow-ups on file.   Earnestine Mealing, CMA  Note - This record has been created using Editor, commissioning.  Chart creation errors have been sought, but may not always  have been located. Such creation errors do not reflect on  the standard of medical care.

## 2018-02-18 NOTE — Telephone Encounter (Signed)
LMAM for patient to call back.

## 2018-02-19 ENCOUNTER — Ambulatory Visit: Payer: BLUE CROSS/BLUE SHIELD | Admitting: Rheumatology

## 2018-02-25 ENCOUNTER — Ambulatory Visit: Payer: BLUE CROSS/BLUE SHIELD | Admitting: Rheumatology

## 2018-03-02 ENCOUNTER — Ambulatory Visit (INDEPENDENT_AMBULATORY_CARE_PROVIDER_SITE_OTHER): Payer: BLUE CROSS/BLUE SHIELD | Admitting: Physician Assistant

## 2018-03-02 ENCOUNTER — Encounter: Payer: Self-pay | Admitting: Physician Assistant

## 2018-03-02 VITALS — BP 116/74 | HR 72 | Resp 12 | Ht 61.0 in | Wt 147.4 lb

## 2018-03-02 DIAGNOSIS — M19071 Primary osteoarthritis, right ankle and foot: Secondary | ICD-10-CM

## 2018-03-02 DIAGNOSIS — W57XXXD Bitten or stung by nonvenomous insect and other nonvenomous arthropods, subsequent encounter: Secondary | ICD-10-CM

## 2018-03-02 DIAGNOSIS — M19072 Primary osteoarthritis, left ankle and foot: Secondary | ICD-10-CM

## 2018-03-02 DIAGNOSIS — M25561 Pain in right knee: Secondary | ICD-10-CM

## 2018-03-02 DIAGNOSIS — M19042 Primary osteoarthritis, left hand: Secondary | ICD-10-CM

## 2018-03-02 DIAGNOSIS — Z79899 Other long term (current) drug therapy: Secondary | ICD-10-CM

## 2018-03-02 DIAGNOSIS — M0579 Rheumatoid arthritis with rheumatoid factor of multiple sites without organ or systems involvement: Secondary | ICD-10-CM | POA: Diagnosis not present

## 2018-03-02 DIAGNOSIS — Z1589 Genetic susceptibility to other disease: Secondary | ICD-10-CM

## 2018-03-02 DIAGNOSIS — Z8719 Personal history of other diseases of the digestive system: Secondary | ICD-10-CM

## 2018-03-02 DIAGNOSIS — M19041 Primary osteoarthritis, right hand: Secondary | ICD-10-CM | POA: Diagnosis not present

## 2018-03-02 DIAGNOSIS — R945 Abnormal results of liver function studies: Secondary | ICD-10-CM

## 2018-03-02 DIAGNOSIS — R5383 Other fatigue: Secondary | ICD-10-CM

## 2018-03-02 DIAGNOSIS — M25571 Pain in right ankle and joints of right foot: Secondary | ICD-10-CM

## 2018-03-02 DIAGNOSIS — G8929 Other chronic pain: Secondary | ICD-10-CM

## 2018-03-02 DIAGNOSIS — R7989 Other specified abnormal findings of blood chemistry: Secondary | ICD-10-CM

## 2018-03-02 DIAGNOSIS — M7072 Other bursitis of hip, left hip: Secondary | ICD-10-CM

## 2018-03-02 DIAGNOSIS — M7062 Trochanteric bursitis, left hip: Secondary | ICD-10-CM

## 2018-03-02 DIAGNOSIS — Z8659 Personal history of other mental and behavioral disorders: Secondary | ICD-10-CM

## 2018-03-02 DIAGNOSIS — Z8639 Personal history of other endocrine, nutritional and metabolic disease: Secondary | ICD-10-CM

## 2018-03-02 DIAGNOSIS — K3184 Gastroparesis: Secondary | ICD-10-CM

## 2018-03-02 DIAGNOSIS — M7061 Trochanteric bursitis, right hip: Secondary | ICD-10-CM

## 2018-03-02 MED ORDER — DICLOFENAC SODIUM 1 % TD GEL: TRANSDERMAL | 3 refills | Status: DC

## 2018-03-02 NOTE — Patient Instructions (Signed)
Trochanteric Bursitis Rehab Ask your health care provider which exercises are safe for you. Do exercises exactly as told by your health care provider and adjust them as directed. It is normal to feel mild stretching, pulling, tightness, or discomfort as you do these exercises, but you should stop right away if you feel sudden pain or your pain gets worse.Do not begin these exercises until told by your health care provider. Stretching exercises These exercises warm up your muscles and joints and improve the movement and flexibility of your hip. These exercises also help to relieve pain and stiffness. Exercise A: Iliotibial band stretch  1. Lie on your side with your left / right leg in the top position. 2. Bend your left / right knee and grab your ankle. 3. Slowly bring your knee back so your thigh is behind your body. 4. Slowly lower your knee toward the floor until you feel a gentle stretch on the outside of your left / right thigh. If you do not feel a stretch and your knee will not fall farther, place the heel of your other foot on top of your outer knee and pull your thigh down farther. 5. Hold this position for __________ seconds. 6. Slowly return to the starting position. Repeat __________ times. Complete this exercise __________ times a day. Strengthening exercises These exercises build strength and endurance in your hip and pelvis. Endurance is the ability to use your muscles for a long time, even after they get tired. Exercise B: Bridge ( hip extensors) 1. Lie on your back on a firm surface with your knees bent and your feet flat on the floor. 2. Tighten your buttocks muscles and lift your buttocks off the floor until your trunk is level with your thighs. You should feel the muscles working in your buttocks and the back of your thighs. If this exercise is too easy, try doing it with your arms crossed over your chest. 3. Hold this position for __________ seconds. 4. Slowly return to the  starting position. 5. Let your muscles relax completely between repetitions. Repeat __________ times. Complete this exercise __________ times a day. Exercise C: Squats ( knee extensors and  quadriceps) 1. Stand in front of a table, with your feet and knees pointing straight ahead. You may rest your hands on the table for balance but not for support. 2. Slowly bend your knees and lower your hips like you are going to sit in a chair. ? Keep your weight over your heels, not over your toes. ? Keep your lower legs upright so they are parallel with the table legs. ? Do not let your hips go lower than your knees. ? Do not bend lower than told by your health care provider. ? If your hip pain increases, do not bend as low. 3. Hold this position for __________ seconds. 4. Slowly push with your legs to return to standing. Do not use your hands to pull yourself to standing. Repeat __________ times. Complete this exercise __________ times a day. Exercise D: Hip hike 1. Stand sideways on a bottom step. Stand on your left / right leg with your other foot unsupported next to the step. You can hold onto the railing or wall if needed for balance. 2. Keeping your knees straight and your torso square, lift your left / right hip up toward the ceiling. 3. Hold this position for __________ seconds. 4. Slowly let your left / right hip lower toward the floor, past the starting position. Your foot   should get closer to the floor. Do not lean or bend your knees. Repeat __________ times. Complete this exercise __________ times a day. Exercise E: Single leg stand 1. Stand near a counter or door frame that you can hold onto for balance as needed. It is helpful to stand in front of a mirror for this exercise so you can watch your hip. 2. Squeeze your left / right buttock muscles then lift up your other foot. Do not let your left / right hip push out to the side. 3. Hold this position for __________ seconds. Repeat  __________ times. Complete this exercise __________ times a day. This information is not intended to replace advice given to you by your health care provider. Make sure you discuss any questions you have with your health care provider. Document Released: 06/12/2004 Document Revised: 01/10/2016 Document Reviewed: 04/20/2015 Elsevier Interactive Patient Education  2018 [place]. Trochanteric Bursitis Trochanteric bursitis is a condition that causes hip pain. Trochanteric bursitis happens when fluid-filled sacs (bursae) in the hip get irritated. Normally these sacs absorb shock and help strong bands of tissue (tendons) in your hip glide smoothly over each other and over your hip bones. What are the causes? This condition results from increased friction between the hip bones and the tendons that go over them. This condition can happen if you:  Have weak hips.  Use your hip muscles too much (overuse).  Get hit in the hip.  What increases the risk? This condition is more likely to develop in:  Women.  Adults who are middle-aged or older.  People with arthritis or a spinal condition.  People with weak buttocks muscles (gluteal muscles).  People who have one leg that is shorter than the other.  People who participate in certain kinds of athletic activities, such as: ? Running sports, especially long-distance running. ? Contact sports, like football or martial arts. ? Sports in which falls may occur, like skiing.  What are the signs or symptoms? The main symptom of this condition is pain and tenderness over the point of your hip. The pain may be:  Sharp and intense.  Dull and achy.  Felt on the outside of your thigh.  It may increase when you:  Lie on your side.  Walk or run.  Go up on stairs.  Sit.  Stand up after sitting.  Stand for long periods of time.  How is this diagnosed? This condition may be diagnosed based on:  Your symptoms.  Your medical  history.  A physical exam.  Imaging tests, such as: ? X-rays to check your bones. ? An MRI or ultrasound to check your tendons and muscles.  During your physical exam, your health care provider will check the movement and strength of your hip. He or she may press on the point of your hip to check for pain. How is this treated? This condition may be treated by:  Resting.  Reducing your activity.  Avoiding activities that cause pain.  Using crutches, a cane, or a walker to decrease the strain on your hip.  Taking medicine to help with swelling.  Having medicine injected into the bursae to help with swelling.  Using ice, heat, and massage therapy for pain relief.  Physical therapy exercises for strength and flexibility.  Surgery (rare).  Follow these instructions at home: Activity  Rest.  Avoid activities that cause pain.  Return to your normal activities as told by your health care provider. Ask your health care provider what activities  are safe for you. Managing pain, stiffness, and swelling  Take over-the-counter and prescription medicines only as told by your health care provider.  If directed, apply heat to the injured area as told by your health care provider. ? Place a towel between your skin and the heat source. ? Leave the heat on for 20-30 minutes. ? Remove the heat if your skin turns bright red. This is especially important if you are unable to feel pain, heat, or cold. You may have a greater risk of getting burned.  If directed, apply ice to the injured area: ? Put ice in a plastic bag. ? Place a towel between your skin and the bag. ? Leave the ice on for 20 minutes, 2-3 times a day. General instructions  If the affected leg is one that you use for driving, ask your health care provider when it is safe to drive.  Use crutches, a cane, or a walker as told by your health care provider.  If one of your legs is shorter than the other, get fitted for a  shoe insert.  Lose weight if you are overweight. How is this prevented?  Wear supportive footwear that is appropriate for your sport.  If you have hip pain, start any new exercise or sport slowly.  Maintain physical fitness, including: ? Strength. ? Flexibility. Contact a health care provider if:  Your pain does not improve with 2-4 weeks. Get help right away if:  You develop severe pain.  You have a fever.  You develop increased redness over your hip.  You have a change in your bowel function or bladder function.  You cannot control the muscles in your feet. This information is not intended to replace advice given to you by your health care provider. Make sure you discuss any questions you have with your health care provider. Document Released: 06/12/2004 Document Revised: 01/09/2016 Document Reviewed: 04/20/2015 Elsevier Interactive Patient Education  [employer].

## 2018-03-04 LAB — ROCKY MTN SPOTTED FVR ABS PNL(IGG+IGM)
RMSF IGM: NOT DETECTED
RMSF IgG: NOT DETECTED

## 2018-03-04 LAB — COMPLETE METABOLIC PANEL WITH GFR
AG RATIO: 1.3 (calc) (ref 1.0–2.5)
ALT: 148 U/L — AB (ref 6–29)
AST: 80 U/L — AB (ref 10–35)
Albumin: 4 g/dL (ref 3.6–5.1)
Alkaline phosphatase (APISO): 117 U/L — ABNORMAL HIGH (ref 33–115)
BILIRUBIN TOTAL: 0.4 mg/dL (ref 0.2–1.2)
BUN: 12 mg/dL (ref 7–25)
CALCIUM: 9.5 mg/dL (ref 8.6–10.2)
CHLORIDE: 103 mmol/L (ref 98–110)
CO2: 28 mmol/L (ref 20–32)
Creat: 0.68 mg/dL (ref 0.50–1.10)
GFR, Est African American: 122 mL/min/{1.73_m2} (ref >=60)
GFR, Est Non African American: 106 mL/min/{1.73_m2} (ref >=60)
Globulin: 3.2 g/dL (calc) (ref 1.9–3.7)
Glucose, Bld: 78 mg/dL (ref 65–99)
POTASSIUM: 4.4 mmol/L (ref 3.5–5.3)
SODIUM: 137 mmol/L (ref 135–146)
Total Protein: 7.2 g/dL (ref 6.1–8.1)

## 2018-03-04 LAB — EPSTEIN-BARR VIRUS VCA ANTIBODY PANEL
EBV NA IgG: 162 U/mL — ABNORMAL HIGH
EBV VCA IgG: >750 U/mL — ABNORMAL HIGH

## 2018-03-04 LAB — CBC WITH DIFFERENTIAL/PLATELET
BASOS ABS: 53 {cells}/uL (ref 0–200)
Basophils Relative: 0.6 %
Eosinophils Absolute: 80 cells/uL (ref 15–500)
Eosinophils Relative: 0.9 %
HCT: 35 % (ref 35.0–45.0)
Hemoglobin: 12 g/dL (ref 11.7–15.5)
Lymphs Abs: 3231 cells/uL (ref 850–3900)
MCH: 29.1 pg (ref 27.0–33.0)
MCHC: 34.3 g/dL (ref 32.0–36.0)
MCV: 84.7 fL (ref 80.0–100.0)
MPV: 9.2 fL (ref 7.5–12.5)
Monocytes Relative: 6.8 %
NEUTROS PCT: 55.4 %
Neutro Abs: 4931 cells/uL (ref 1500–7800)
PLATELETS: 410 10*3/uL — AB (ref 140–400)
RBC: 4.13 10*6/uL (ref 3.80–5.10)
RDW: 12.6 % (ref 11.0–15.0)
TOTAL LYMPHOCYTE: 36.3 %
WBC: 8.9 10*3/uL (ref 3.8–10.8)
WBCMIX: 605 {cells}/uL (ref 200–950)

## 2018-03-04 LAB — B. BURGDORFI ANTIBODIES: B burgdorferi Ab IgG+IgM: <0.9 index

## 2018-03-04 LAB — SEDIMENTATION RATE: SED RATE: 22 mm/h — AB (ref 0–20)

## 2018-03-04 NOTE — Progress Notes (Signed)
Plts mildly elevated. We will continue to monitor.  Sed rate mildly elevated-22. LFTs are elevated.  Please advise patient to avoid tylenol, NSAIDs, and alcohol.  Please forward labs to GI specialist.  Negative for lyme's disease and RMSF.  EBV IgG positive which is indicative a previous infection.  IgM is negative which means she does not currently have EBV.

## 2018-03-05 ENCOUNTER — Telehealth: Payer: Self-pay | Admitting: Rheumatology

## 2018-03-05 NOTE — Telephone Encounter (Signed)
Attempted to contact patient and left message for patient to call the office.  

## 2018-03-05 NOTE — Telephone Encounter (Signed)
Patient called stating she was returning your call regarding her labwork.

## 2018-07-12 ENCOUNTER — Encounter: Payer: Self-pay | Admitting: Internal Medicine

## 2018-07-12 ENCOUNTER — Ambulatory Visit: Payer: BLUE CROSS/BLUE SHIELD | Admitting: Internal Medicine

## 2018-07-12 VITALS — BP 108/78 | HR 73 | Ht 61.0 in | Wt 144.6 lb

## 2018-07-12 DIAGNOSIS — K3184 Gastroparesis: Secondary | ICD-10-CM | POA: Diagnosis not present

## 2018-07-12 DIAGNOSIS — M0579 Rheumatoid arthritis with rheumatoid factor of multiple sites without organ or systems involvement: Secondary | ICD-10-CM | POA: Diagnosis not present

## 2018-07-12 DIAGNOSIS — Z8659 Personal history of other mental and behavioral disorders: Secondary | ICD-10-CM

## 2018-07-12 DIAGNOSIS — R42 Dizziness and giddiness: Secondary | ICD-10-CM

## 2018-07-12 NOTE — Patient Instructions (Signed)
Medication Instructions:  Dr.Acharya recommends that you continue on your current medications as directed. Please refer to the Current Medication list given to you today.  If you need a refill on your cardiac medications before your next appointment, please call your pharmacy.   Lab work: None ordered   Testing/Procedures: Dr.Acharya has recommended that you wear a holter monitor. Holter monitors are medical devices that record the heart's electrical activity. Doctors most often use these monitors to diagnose arrhythmias. Arrhythmias are problems with the speed or rhythm of the heartbeat. The monitor is a small, portable device. You can wear one while you do your normal daily activities. This is usually used to diagnose what is causing palpitations/syncope (passing out).  Dr. Margaretann Loveless has requested that you have an echocardiogram. Echocardiography is a painless test that uses sound waves to create images of your heart. It provides your doctor with information about the size and shape of your heart and how well your heart's chambers and valves are working. This procedure takes approximately one hour. There are no restrictions for this procedure.      Follow-Up: At [facility], you and your health needs are our priority.  As part of our continuing mission to provide you with exceptional heart care, we have created designated Provider Care Teams.  These Care Teams include your primary Cardiologist (physician) and Advanced Practice Providers (APPs -  Physician Assistants and Nurse Practitioners) who all work together to provide you with the care you need, when you need it. You will need a follow up appointment in 2-4 weeks.  You may see Dr.Acharya or one of the following Advanced Practice Providers on your designated Care Team:   Rosaria Ferries, PA-C . Jory Sims, DNP, ANP  Any Other Special Instructions Will Be Listed Below (If Applicable). Please purchase and wear compression stockings  daily.  Please increase sodium in your diet  Dr.Acharya encourages you not to drive

## 2018-07-12 NOTE — Progress Notes (Signed)
Cardiology Office Note:    Date:  07/12/2018   ID:  Debbie [name], DOB [birth year], MRN [id number]  PCP:  Mateo Flow, MD  Cardiologist:  No primary care provider on file.  Electrophysiologist:  None   Referring MD: Mateo Flow, MD   Dizziness  History of Present Illness:    Debbie [name] is a 48 y.o. female with a hx of focal neurologic symptoms, gastroparesis, chronic dehydration, and inflammatory polyarthropathy suspect rheumatoid arthritis or possible lupus.  She is on hydroxychloroquine.  She has had multiple episodes of dizziness, which she describes as dizziness, weakness, stuttering speech, and dissociation but no loss of consciousness or amnesia.  Episodes last approximately 20 minutes.  She has had normal cross-sectional imaging of the head in 2018.  She is also had episodes of a left posterior headache with tingling into the right arm and discomfort in the left arm.  She describes fuzzy thinking, dizziness and she feels off balance.  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  She has been prescribed Florinef by her family physician, however has never started this medication, and does not recall being prescribed this medication.  She feels that her episode started in 2016 where she would get a feeling of stuttering and a tremor with nausea and vomiting.  This lasted for 1-1/2 years and then she feels it may have gone away for 1 year.  The symptoms increased from monthly to weekly to daily.  Her last 2 major episodes were in July 2019 and December 2019.  She gets a sensation that her hands are numb and tingling that she feels weak and progressively fuzzy in her thought and she feels that if she continues to speak she will begin to stutter.  She also feels that her legs get weak.  She has tried to cut out caffeine because it makes her feel jittery, but struggles because she really enjoys consuming caffeine.  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  She has not tried compression stockings, liberalizing her salt intake, or maneuvers to mitigate vasovagal sensations including squeezing her major muscle groups.  She has no family history of repeated syncope, she possibly has a family history of early MI with her father having a "mild heart attack", and she has no known family history of sudden cardiac death.  She does have a strong family history of diabetes.  She feels that she has been screened by her primary care physician once before, but is has not been updated more recently per her report.  She is noted to have gastroparesis in her history.  She feels that she drinks enough fluids daily, and consumes approximately 3-4 bottles of water per day.  She denies the use of herbal supplements or diet aids.  She has not had a tilt table test per her report.  She reports significant life stressors in the recent past and significant life changes.  The patient denies chest pain, chest pressure, dyspnea at rest or with exertion, palpitations, PND, orthopnea, or leg swelling. Denies syncope or presyncope.   Past Medical History:  Diagnosis Date  . Allergy   . Anemia   . Anorexia nervosa with bulimia 2004  . Arthritis   . Gastroparesis   .  GERD (gastroesophageal reflux disease)   . Kidney stones   . Lupus ([place])   . Menopause   . Pain and swelling of right forearm   . SLE (systemic lupus erythematosus) ([place]) 08/29/2016    Past Surgical History:  Procedure Laterality Date  . ABDOMINAL HYSTERECTOMY     2x,2008-2009  . CARPAL TUNNEL RELEASE Right   . CHOLECYSTECTOMY    . CYSTOSCOPY WITH RETROGRADE PYELOGRAM, URETEROSCOPY AND STENT PLACEMENT Left 06/22/2015   Procedure: LEFT URETEROSCOPY AND STONE EXTRACTION ;  Surgeon: Kathie Rhodes, MD;   Location: [facility];  Service: Urology;  Laterality: Left;  . KIDNEY STONE SURGERY     5  . OVARIAN CYST SURGERY  2007   LAP    Current Medications: Current Meds  Medication Sig  . cetirizine (ZYRTEC) 10 MG tablet Take 10 mg by mouth daily as needed for allergies.   Marland Kitchen dexlansoprazole (DEXILANT) 60 MG capsule Take 60 mg by mouth daily.  Marland Kitchen estradiol (ESTRACE) 0.5 MG tablet Take 0.5 mg by mouth daily. Takes with the 1mg  tablet.  Marland Kitchen estradiol (ESTRACE) 1 MG tablet Take 1 mg by mouth daily.  . Fluticasone Propionate (FLONASE ALLERGY RELIEF NA) Place into the nose daily.  Marland Kitchen glycopyrrolate (ROBINUL) 2 MG tablet Take 2 mg by mouth as needed.   . hydroxychloroquine (PLAQUENIL) 200 MG tablet TAKE 1 TABLET EACH MORNING AND 1/2 TABLET AT BEDTIME  . MAGNESIUM PO Take by mouth daily.  . Multiple Vitamins-Minerals (MULTIVITAMIN ADULT PO) Take by mouth daily.  . polyethylene glycol (MIRALAX / GLYCOLAX) packet Take 17 g by mouth as needed.   . promethazine (PHENERGAN) 25 MG tablet Take 25 mg by mouth every 6 (six) hours as needed for nausea or vomiting.     Allergies:   Zofran [ondansetron hcl] and Metoclopramide   Social History   Socioeconomic History  . Marital status: Married    Spouse name: Not on file  . Number of children: 2  . Years of education: BS  . Highest education level: Not on file  Occupational History  . 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 Financial resource strain: Not on file  . Food insecurity:    Worry: Not on file    Inability: Not on file  . Transportation needs:    Medical: Not on file    Non-medical: Not on file  Tobacco Use  . Smoking status: Never Smoker  . Smokeless tobacco: Never Used  Substance and Sexual Activity  . Alcohol use: No    Comment: rarely   . Drug use: No  . Sexual activity: Yes  Lifestyle  . Physical activity:    Days per week: Not on file    Minutes per session: Not on file    . Stress: Not on file  Relationships  . Social connections:    Talks on phone: Not on file    Gets together: Not on file    Attends religious service: Not on file    Active member of club or organization: Not on file    Attends meetings of clubs or organizations: Not on file    Relationship status: Not on file  Other Topics Concern  . Not on file  Social History Narrative   Married. Education: [employer]. Exercise 3 times 1 hour or so. Patient consumes 1 or 2 cups of coffee and does exercise.     Family History: The patient's family history  includes Arthritis in her mother; Cancer in her maternal grandfather; Diabetes in her father, maternal grandmother, paternal grandfather, and another family member; Hashimoto's thyroiditis in her daughter; Heart attack in her mother; Heart disease in her paternal grandfather and paternal grandmother; Hernia in her mother; Hypertension in her mother; Lupus in her sister; Rheum arthritis in her daughter.  ROS:   Please see the history of present illness.    All other systems reviewed and are negative.  EKGs/Labs/Other Studies Reviewed:    The following studies were reviewed today:  EKG: Normal sinus rhythm ventricular rate 73 bpm.  Recent Labs: 03/02/2018: ALT 148; BUN 12; Creat 0.68; Hemoglobin 12.0; Platelets 410; Potassium 4.4; Sodium 137  Recent Lipid Panel No results found for: CHOL, TRIG, HDL, CHOLHDL, VLDL, LDLCALC, LDLDIRECT  Physical Exam:    VS:  BP 108/78 (BP Location: Right Arm)   Pulse 73   Ht 5\' 1"  (1.549 m)   Wt 144 lb 9.6 oz (65.6 kg)   SpO2 99%   BMI 27.32 kg/m     Wt Readings from Last 3 Encounters:  07/12/18 144 lb 9.6 oz (65.6 kg)  03/02/18 147 lb 6.4 oz (66.9 kg)  09/17/17 152 lb (68.9 kg)     Constitutional: No acute distress Eyes: pupils equally round and reactive to light, sclera non-icteric, normal conjunctiva and lids ENMT: normal dentition, moist mucous membranes Cardiovascular: regular rhythm, normal  rate, no murmurs. S1 and S2 normal. Radial pulses normal bilaterally. No jugular venous distention.  Respiratory: clear to auscultation bilaterally GI : normal bowel sounds, soft and nontender. No distention.   MSK: extremities warm, well perfused. No edema.  NEURO: grossly nonfocal exam, moves all extremities. PSYCH: alert and oriented x 3, normal mood and affect.    ASSESSMENT:    1. Dizziness    PLAN:    For her dizziness we will obtain an echocardiogram to rule out structural causes of these episodes, we will obtain a 48-hour Holter monitor.  She has had an unremarkable event monitor in 2018, however she feels that her symptoms are far more frequent now and are occurring daily.    She has not tried conservative measures for her symptoms yet including compression stockings and liberalizing salt intake, and I have made these recommendations today.  May be reasonable for her to revisit with a neurologist since she has not seen one since 2016 per her report to review if the symptoms are primarily neurologic in nature, particularly if she rules out from a cardiovascular standpoint.  I would like to see her back to review of symptoms and conservative measures effectiveness in 2 to 4 weeks.  Medication Adjustments/Labs and Tests Ordered: Current medicines are reviewed at length with the patient today.  Concerns regarding medicines are outlined above.  Orders Placed This Encounter  Procedures  . HOLTER MONITOR - 48 HOUR  . EKG 12-Lead  . ECHOCARDIOGRAM COMPLETE   No orders of the defined types were placed in this encounter.   Patient Instructions  Medication Instructions:  Dr.Dollye Glasser recommends that you continue on your current medications as directed. Please refer to the Current Medication list given to you today.  If you need a refill on your cardiac medications before your next appointment, please call your pharmacy.   Lab work: None ordered   Testing/Procedures: Dr.Kindred Reidinger  has recommended that you wear a holter monitor. Holter monitors are medical devices that record the heart's electrical activity. Doctors most often use these monitors to diagnose arrhythmias. Arrhythmias are  problems with the speed or rhythm of the heartbeat. The monitor is a small, portable device. You can wear one while you do your normal daily activities. This is usually used to diagnose what is causing palpitations/syncope (passing out).  Dr. Margaretann Loveless has requested that you have an echocardiogram. Echocardiography is a painless test that uses sound waves to create images of your heart. It provides your doctor with information about the size and shape of your heart and how well your heart's chambers and valves are working. This procedure takes approximately one hour. There are no restrictions for this procedure.      Follow-Up: At [facility], you and your health needs are our priority.  As part of our continuing mission to provide you with exceptional heart care, we have created designated Provider Care Teams.  These Care Teams include your primary Cardiologist (physician) and Advanced Practice Providers (APPs -  Physician Assistants and Nurse Practitioners) who all work together to provide you with the care you need, when you need it. You will need a follow up appointment in 2-4 weeks.  You may see Dr.Soumya Colson or one of the following Advanced Practice Providers on your designated Care Team:   Rosaria Ferries, PA-C . Jory Sims, DNP, ANP  Any Other Special Instructions Will Be Listed Below (If Applicable). Please purchase and wear compression stockings daily.  Please increase sodium in your diet  Dr.Aanika Defoor encourages you not to drive        Signed, Elouise Munroe, MD  07/12/2018 9:06 AM    [place]

## 2018-07-21 ENCOUNTER — Ambulatory Visit (INDEPENDENT_AMBULATORY_CARE_PROVIDER_SITE_OTHER): Payer: BLUE CROSS/BLUE SHIELD

## 2018-07-21 ENCOUNTER — Ambulatory Visit (HOSPITAL_COMMUNITY): Payer: BLUE CROSS/BLUE SHIELD | Attending: Cardiology

## 2018-07-21 DIAGNOSIS — R42 Dizziness and giddiness: Secondary | ICD-10-CM | POA: Diagnosis not present

## 2018-07-21 NOTE — Progress Notes (Deleted)
Office Visit Note  Patient: Debbie [name]             Date of Birth: 1970-08-22           MRN: [id number]             PCP: Mateo Flow, MD Referring: Mateo Flow, MD Visit Date: 08/04/2018 Occupation: @GUAROCC @  Subjective:  No chief complaint on file.  Plaquenil 200 mg in the morning and 100 mg in the evening.  Last Plaquenil eye exam normal on 12/12/2016.  Most recent CBC/CMP showed elevated platelets and elevated LFTs on 03/02/2018.  Due for CBC/CMP today and will monitor every 5 months.  Recommend annual flu, Prevnar 13, and Pneumovax 23 as indicated.  History of Present Illness: Debbie [name] is a 48 y.o. female ***   Activities of Daily Living:  Patient reports morning stiffness for *** {minute/hour:19697}.   Patient {ACTIONS;DENIES/REPORTS:21021675::"Denies"} nocturnal pain.  Difficulty dressing/grooming: {ACTIONS;DENIES/REPORTS:21021675::"Denies"} Difficulty climbing stairs: {ACTIONS;DENIES/REPORTS:21021675::"Denies"} Difficulty getting out of chair: {ACTIONS;DENIES/REPORTS:21021675::"Denies"} Difficulty using hands for taps, buttons, cutlery, and/or writing: {ACTIONS;DENIES/REPORTS:21021675::"Denies"}  No Rheumatology ROS completed.   PMFS History:  Patient Active Problem List   Diagnosis Date Noted  . Bilateral temporomandibular joint pain 10/29/2016  . Dizziness 10/29/2016  . Primary osteoarthritis of both hands 08/31/2016  . SLE (systemic lupus erythematosus) ([place]) 08/29/2016  . Abnormal finding on MRI of brain 08/27/2016  . Rheumatoid arthritis involving multiple sites with positive rheumatoid factor ([place]) 08/26/2016  . Primary osteoarthritis of both feet 08/26/2016  . High risk medication use 08/26/2016  . HLA B27 (HLA B27 positive) 08/26/2016  . Other fatigue 08/26/2016  . Vitamin D deficiency 08/26/2016  . Elevated LFTs 08/26/2016  . History of anxiety 08/26/2016  . History of gastroesophageal reflux (GERD) 08/26/2016  . History of eating  disorder 08/26/2016  . Early menopause 08/26/2016  . History of cholelithiasis 08/26/2016  . Gastroparesis 08/26/2016  . 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 Pain and swelling of right forearm   . Kidney stones 10/11/2012  . Chronic headaches 10/11/2012    Past Medical History:  Diagnosis Date  . Allergy   . Anemia   . Anorexia nervosa with bulimia 2004  . Arthritis   . Gastroparesis   . GERD (gastroesophageal reflux disease)   . Kidney stones   . Lupus ([place])   . Menopause   . Pain and swelling of right forearm   . SLE (systemic lupus erythematosus) ([place]) 08/29/2016    Family History  Problem Relation Age of Onset  . Arthritis Mother        rheumatoid   . Hernia Mother   . Hypertension Mother   . Heart attack Mother   . Diabetes Father   . Lupus Sister   . Diabetes Maternal Grandmother   . Cancer Maternal Grandfather   . Diabetes Paternal Grandfather   . Heart disease Paternal Grandfather   . Rheum arthritis Daughter   . Hashimoto's thyroiditis Daughter   . Heart disease Paternal Grandmother   . Diabetes Other    Past Surgical History:  Procedure Laterality Date  . ABDOMINAL HYSTERECTOMY     2x,2008-2009  . CARPAL TUNNEL RELEASE Right   . CHOLECYSTECTOMY    . CYSTOSCOPY WITH RETROGRADE PYELOGRAM, URETEROSCOPY AND STENT PLACEMENT Left 06/22/2015   Procedure: LEFT URETEROSCOPY AND STONE EXTRACTION ;  Surgeon: Kathie Rhodes, MD;  Location: [facility];  Service: Urology;  Laterality: Left;  . KIDNEY STONE SURGERY     6  .  OVARIAN CYST SURGERY  2007   LAP   Social History   Social History Narrative   Married. Education: [employer]. Exercise 3 times 1 hour or so. Patient consumes 1 or 2 cups of coffee and does exercise.    There is no immunization history on file for this patient.   Objective: Vital Signs: There were no vitals taken for this visit.   Physical Exam   Musculoskeletal Exam: ***  CDAI Exam: CDAI Score: Not documented Patient Global  Assessment: Not documented; Provider Global Assessment: Not documented Swollen: Not documented; Tender: Not documented Joint Exam   Not documented   There is currently no information documented on the homunculus. Go to the Rheumatology activity and complete the homunculus joint exam.  Investigation: No additional findings.  Imaging: No results found.  Recent Labs: Lab Results  Component Value Date   WBC 8.9 03/02/2018   HGB 12.0 03/02/2018   PLT 410 (H) 03/02/2018   NA 137 03/02/2018   K 4.4 03/02/2018   CL 103 03/02/2018   CO2 28 03/02/2018   GLUCOSE 78 03/02/2018   BUN 12 03/02/2018   CREATININE 0.68 03/02/2018   BILITOT 0.4 03/02/2018   ALKPHOS 180 (H) 11/11/2016   AST 80 (H) 03/02/2018   ALT 148 (H) 03/02/2018   PROT 7.2 03/02/2018   ALBUMIN 3.9 11/11/2016   CALCIUM 9.5 03/02/2018   GFRAA 122 03/02/2018    Speciality Comments: PLQ eye exam: [facility] 12/12/16 at [facility]  Procedures:  No procedures performed Allergies: Zofran [ondansetron hcl] and Metoclopramide   Assessment / Plan:     Visit Diagnoses: No diagnosis found.   Orders: No orders of the defined types were placed in this encounter.  No orders of the defined types were placed in this encounter.   Face-to-face time spent with patient was *** minutes. Greater than 50% of time was spent in counseling and coordination of care.  Follow-Up Instructions: No follow-ups on file.   Earnestine Mealing, CMA  Note - This record has been created using Editor, commissioning.  Chart creation errors have been sought, but may not always  have been located. Such creation errors do not reflect on  the standard of medical care.

## 2018-08-02 ENCOUNTER — Encounter: Payer: Self-pay | Admitting: Internal Medicine

## 2018-08-02 ENCOUNTER — Other Ambulatory Visit: Payer: Self-pay

## 2018-08-02 ENCOUNTER — Ambulatory Visit: Payer: BLUE CROSS/BLUE SHIELD | Admitting: Internal Medicine

## 2018-08-02 VITALS — BP 112/76 | HR 74 | Ht 61.0 in | Wt 149.4 lb

## 2018-08-02 DIAGNOSIS — R42 Dizziness and giddiness: Secondary | ICD-10-CM | POA: Diagnosis not present

## 2018-08-02 DIAGNOSIS — M0579 Rheumatoid arthritis with rheumatoid factor of multiple sites without organ or systems involvement: Secondary | ICD-10-CM | POA: Diagnosis not present

## 2018-08-02 DIAGNOSIS — K3184 Gastroparesis: Secondary | ICD-10-CM

## 2018-08-02 DIAGNOSIS — Z8659 Personal history of other mental and behavioral disorders: Secondary | ICD-10-CM

## 2018-08-02 NOTE — Progress Notes (Signed)
Cardiology Office Note:    Date:  08/02/2018   ID:  [name], DOB 06-17-1970, MRN [id number]  PCP:  Mateo Flow, MD  Cardiologist:  No primary care provider on file.  Electrophysiologist:  None   Referring MD: Mateo Flow, MD   Dizziness, possible POTS  History of Present Illness:    Debbie [name] is a 48 y.o. female with a hx of focal neurologic symptoms, gastroparesis, chronic dehydration, and inflammatory polyarthropathy suspect rheumatoid arthritis or possible lupus.  She is on hydroxychloroquine.   Please see my note from July 12, 2018 for full details of our encounter.  We placed a Holter monitor which was unremarkable.  Echocardiogram was normal.  I recommended conservative measures at our last visit including compression stockings, liberalizing salt, and increasing fluid intake while decreasing caffeine intake.  She is complied with these recommendations and feels much better.  She is only had 2 very minor episodes of dizziness since the time of our last visit.  She has had no significant symptoms while driving which is extremely reassuring.  The patient denies chest pain, chest pressure, dyspnea at rest or with exertion, palpitations, PND, orthopnea, or leg swelling. Denies syncope or presyncope.  Improving dizziness and lightheadedness. Past Medical History:  Diagnosis Date  . Allergy   . Anemia   . Anorexia nervosa with bulimia 2004  . Arthritis   . Gastroparesis   . GERD (gastroesophageal reflux disease)   . Kidney stones   . Lupus ([place])   . Menopause   . Pain and swelling of right forearm   . SLE (systemic lupus erythematosus) ([place]) 08/29/2016    Past Surgical History:  Procedure Laterality Date  . ABDOMINAL HYSTERECTOMY     2x,2008-2009  . CARPAL TUNNEL RELEASE Right   . CHOLECYSTECTOMY    . CYSTOSCOPY WITH RETROGRADE PYELOGRAM, URETEROSCOPY AND STENT PLACEMENT Left 06/22/2015   Procedure: LEFT URETEROSCOPY AND STONE EXTRACTION ;   Surgeon: Kathie Rhodes, MD;  Location: [facility];  Service: Urology;  Laterality: Left;  . KIDNEY STONE SURGERY     5  . OVARIAN CYST SURGERY  2007   LAP    Current Medications: Current Meds  Medication Sig  . cetirizine (ZYRTEC) 10 MG tablet Take 10 mg by mouth daily as needed for allergies.   Marland Kitchen dexlansoprazole (DEXILANT) 60 MG capsule Take 60 mg by mouth daily.  Marland Kitchen estradiol (ESTRACE) 0.5 MG tablet Take 0.5 mg by mouth daily. Takes with the 1mg  tablet.  Marland Kitchen estradiol (ESTRACE) 1 MG tablet Take 1 mg by mouth daily.  . Fluticasone Propionate (FLONASE ALLERGY RELIEF NA) Place into the nose daily.  Marland Kitchen glycopyrrolate (ROBINUL) 2 MG tablet Take 2 mg by mouth as needed.   . hydroxychloroquine (PLAQUENIL) 200 MG tablet TAKE 1 TABLET EACH MORNING AND 1/2 TABLET AT BEDTIME  . MAGNESIUM PO Take by mouth daily.  . Multiple Vitamins-Minerals (MULTIVITAMIN ADULT PO) Take by mouth daily.  . polyethylene glycol (MIRALAX / GLYCOLAX) packet Take 17 g by mouth as needed.   . promethazine (PHENERGAN) 25 MG tablet Take 25 mg by mouth every 6 (six) hours as needed for nausea or vomiting.     Allergies:   Zofran [ondansetron hcl] and Metoclopramide   Social History   Socioeconomic History  . Marital status: Married    Spouse name: Not on file  . Number of children: 2  . Years of education: BS  . Highest education level: Not on file  Occupational History  .  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 Financial resource strain: Not on file  . Food insecurity:    Worry: Not on file    Inability: Not on file  . Transportation needs:    Medical: Not on file    Non-medical: Not on file  Tobacco Use  . Smoking status: Never Smoker  . Smokeless tobacco: Never Used  Substance and Sexual Activity  . Alcohol use: No    Comment: rarely   . Drug use: No  . Sexual activity: Yes  Lifestyle  . Physical activity:    Days per week: Not on file     Minutes per session: Not on file  . Stress: Not on file  Relationships  . Social connections:    Talks on phone: Not on file    Gets together: Not on file    Attends religious service: Not on file    Active member of club or organization: Not on file    Attends meetings of clubs or organizations: Not on file    Relationship status: Not on file  Other Topics Concern  . Not on file  Social History Narrative   Married. Education: [employer]. Exercise 3 times 1 hour or so. Patient consumes 1 or 2 cups of coffee and does exercise.     Family History: The patient's family history includes Arthritis in her mother; Cancer in her maternal grandfather; Diabetes in her father, maternal grandmother, paternal grandfather, and another family member; Hashimoto's thyroiditis in her daughter; Heart attack in her mother; Heart disease in her paternal grandfather and paternal grandmother; Hernia in her mother; Hypertension in her mother; Lupus in her sister; Rheum arthritis in her daughter.  ROS:   Please see the history of present illness.    All other systems reviewed and are negative.  EKGs/Labs/Other Studies Reviewed:    The following studies were reviewed today:  EKG:  Not obtained today  Recent Labs: 03/02/2018: ALT 148; BUN 12; Creat 0.68; Hemoglobin 12.0; Platelets 410; Potassium 4.4; Sodium 137  Recent Lipid Panel No results found for: CHOL, TRIG, HDL, CHOLHDL, VLDL, LDLCALC, LDLDIRECT  Physical Exam:    VS:  BP 112/76   Pulse 74   Ht 5\' 1"  (1.549 m)   Wt 149 lb 6.4 oz (67.8 kg)   SpO2 99%   BMI 28.23 kg/m     Wt Readings from Last 3 Encounters:  08/02/18 149 lb 6.4 oz (67.8 kg)  07/12/18 144 lb 9.6 oz (65.6 kg)  03/02/18 147 lb 6.4 oz (66.9 kg)     Constitutional: No acute distress Eyes: pupils equally round and reactive to light, sclera non-icteric, normal conjunctiva and lids ENMT: normal dentition, moist mucous membranes Cardiovascular: regular rhythm, normal rate, no  murmurs. S1 and S2 normal. Radial pulses normal bilaterally. No jugular venous distention.  Respiratory: clear to auscultation bilaterally GI : normal bowel sounds, soft and nontender. No distention.   MSK: extremities warm, well perfused. No edema.  NEURO: grossly nonfocal exam, moves all extremities. PSYCH: alert and oriented x 3, normal mood and affect.   ASSESSMENT:    1. Dizziness   2. Gastroparesis   3. Rheumatoid arthritis involving multiple sites with positive rheumatoid factor ([place])   4. History of anxiety    PLAN:    She is doing much better with the conservative measures that we recommended at our last visit including compression stockings, liberalizing salt, and decreasing caffeine  as well as increasing fluid intake.  She is experiencing some life stressors with her dog who is diabetic and has been somewhat ill, but otherwise has no specific cardiopulmonary concerns.  Her testing is all reassuring.  We have discussed that she may have POTS, but no further testing is required at this time. I have encouraged the patient to follow up as needed if symptoms worsen.   Medication Adjustments/Labs and Tests Ordered: Current medicines are reviewed at length with the patient today.  Concerns regarding medicines are outlined above.  No orders of the defined types were placed in this encounter.  No orders of the defined types were placed in this encounter.   Patient Instructions  Medication Instructions:  Dr. recommends that you continue on your current medications as directed. Please refer to the Current Medication list given to you today.  If you need a refill on your cardiac medications before your next appointment, please call your pharmacy.   Lab work: None ordered  Testing/Procedures: None ordered  Follow-Up: At [facility], you and your health needs are our priority.  As part of our continuing mission to provide you with exceptional heart care, we have created  designated Provider Care Teams.  These Care Teams include your primary Cardiologist (physician) and Advanced Practice Providers (APPs -  Physician Assistants and Nurse Practitioners) who all work together to provide you with the care you need, when you need it. You will need a follow up appointment as needed     Signed, Elouise Munroe, MD  08/02/2018 9:23 AM    [place]

## 2018-08-02 NOTE — Patient Instructions (Signed)
Medication Instructions:  Dr.Acharya recommends that you continue on your current medications as directed. Please refer to the Current Medication list given to you today.  If you need a refill on your cardiac medications before your next appointment, please call your pharmacy.   Lab work: None ordered  Testing/Procedures: None ordered  Follow-Up: At [facility], you and your health needs are our priority.  As part of our continuing mission to provide you with exceptional heart care, we have created designated Provider Care Teams.  These Care Teams include your primary Cardiologist (physician) and Advanced Practice Providers (APPs -  Physician Assistants and Nurse Practitioners) who all work together to provide you with the care you need, when you need it. You will need a follow up appointment as needed

## 2018-08-04 ENCOUNTER — Ambulatory Visit: Payer: BLUE CROSS/BLUE SHIELD | Admitting: Rheumatology

## 2019-03-22 ENCOUNTER — Telehealth: Payer: Self-pay | Admitting: Oncology

## 2019-03-22 NOTE — Telephone Encounter (Signed)
Confirmed with patient 12/9 new high risk appointment with Dr. Jana Hakim.

## 2019-04-26 ENCOUNTER — Encounter: Payer: Self-pay | Admitting: Oncology

## 2019-04-26 NOTE — Progress Notes (Signed)
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 He was diagnosed with metastatic cancer involving lung pancreas and colon.  It is not clear what the primary was.  He was 75 at the time of death..  With this background when Debbie [name] most recently saw Dr. 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  The patient's subsequent history is as detailed below.   INTERVAL HISTORY: Debbie [name] was evaluated in the high risk breast cancer clinic on 04/27/2019 accompanied by her husband Debbie [name]   REVIEW OF SYSTEMS: Debbie [name] has a complex review of systems because of her multiple comorbidities chiefly relating to autoimmune diseases.  However currently she denies unusual headaches, visual changes, nausea, vomiting, stiff neck, dizziness, or gait imbalance. There has been no cough, phlegm production, or pleurisy, no chest pain or pressure, and no change in bowel or bladder habits. A detailed review of systems was otherwise entirely negative.   PAST MEDICAL HISTORY: Past Medical History:  Diagnosis Date  . Allergy   . Anemia   . Anorexia nervosa with bulimia 2004  . Arthritis   . Gastroparesis   . GERD (gastroesophageal reflux disease)    . Kidney stones   . Lupus ([place])   . Menopause   . Pain and swelling of right forearm   . SLE (systemic lupus erythematosus) ([place]) 08/29/2016  POTS syndrome  PAST SURGICAL HISTORY: Past Surgical History:  Procedure Laterality Date  . ABDOMINAL HYSTERECTOMY     2x,2008-2009  . CARPAL TUNNEL RELEASE Right   . CHOLECYSTECTOMY    . CYSTOSCOPY WITH RETROGRADE PYELOGRAM, URETEROSCOPY AND STENT PLACEMENT Left 06/22/2015   Procedure: LEFT URETEROSCOPY AND STONE EXTRACTION ;  Surgeon: Debbie Rhodes, MD;  Location: [facility];  Service: Urology;  Laterality: Left;  . KIDNEY STONE SURGERY     6  . OVARIAN CYST SURGERY  2007   LAP    FAMILY HISTORY: Family History  Problem Relation Age of Onset  . Arthritis Mother        rheumatoid   . Hernia Mother   . Hypertension Mother   . Heart attack Mother   . Diabetes Father   . Lupus Sister   . Diabetes Maternal Grandmother   . Cancer Maternal Grandfather   . Pancreatic cancer Maternal Grandfather 15  . Lung cancer Maternal Grandfather 64  . Colon cancer Maternal Grandfather 57       colorectal  . Diabetes Paternal Grandfather   . Heart disease Paternal Grandfather   . Rheum arthritis Daughter   . Hashimoto's thyroiditis Daughter   . Heart disease Paternal Grandmother   . Diabetes Other    [name] died at age 51 from complications of diabetes.  His parents had no history of cancer.  [name] had 2 sisters and 3 brothers, with no cancer in  any of those siblings.  The patient's mother is 83 years old as of December 2020.  Her father had metastatic cancer but the patient does not know what the primary was.  The maternal grandmother did not have cancer and neither did this single maternal aunt.  GYNECOLOGIC HISTORY:  No LMP recorded. Patient has had a hysterectomy. Menarche: 49 years old Age at first live birth: 48 years old [place] P 2 LMP age 24 (with hysterectomy HRT estrogen alone Hysterectomy? Yes, at age 64 BSO? 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  Her husband Debbie [name] works for Marketing executive.  There is son Debbie [name], 19 as of December 2020 lives in [place] and works Biomedical engineer towers.  Daughter Debbie [name], 21, is a Development worker, community.  The patient has no grandchildren.  She attends a Debbie [name] DIRECTIVES: In the absence of any documents to the contrary the patient's husband is her healthcare power of attorney   HEALTH MAINTENANCE: Social History   Tobacco Use  . Smoking status: Never Smoker  . Smokeless tobacco: Never Used  Substance Use Topics  . Alcohol use: No    Comment: rarely   . Drug use: No     Colonoscopy: 2016 (Debbie [name])  PAP: 01/2016, negative  Bone density: Never   Allergies  Allergen Reactions  . Zofran [Ondansetron Hcl] Other (See Comments)    Stutters and looses control of her arm and legs  . Metoclopramide     Current Outpatient Medications  Medication Sig Dispense Refill  . Ascorbic Acid (VITAMIN C PO) Take by mouth.    . cetirizine (ZYRTEC) 10 MG tablet Take 10 mg by mouth daily as needed for allergies.     . Cyanocobalamin (VITAMIN B-12 PO) Take by mouth.    . Esomeprazole Magnesium (NEXIUM PO) Take by mouth.    . estradiol (ESTRACE) 0.5 MG tablet Take 0.5 mg by mouth daily. Takes with the 55m tablet.    .Marland Kitchenestradiol (ESTRACE) 1 MG tablet Take 1 mg by mouth daily.    . Fluticasone Propionate (FLONASE ALLERGY RELIEF NA) Place into the nose daily.    .Marland Kitchenglycopyrrolate (ROBINUL) 2 MG tablet Take 2 mg by mouth as needed.     . hydroxychloroquine (PLAQUENIL) 200 MG tablet TAKE 1 TABLET EACH MORNING AND 1/2 TABLET AT BEDTIME 135 tablet 0  . Multiple Vitamins-Minerals (MULTIVITAMIN ADULT PO) Take by mouth daily.    . polyethylene glycol (MIRALAX / GLYCOLAX) packet Take 17 g by mouth as needed.     . promethazine (PHENERGAN) 25 MG tablet Take 25 mg by mouth every 6 (six) hours as  needed for nausea or vomiting.    .Marland KitchenVITAMIN D PO Take by mouth.     No current facility-administered medications for this visit.     OBJECTIVE: Young white [name] in no acute distress  Vitals:   04/27/19 1551  BP: 122/83  Pulse: 94  Resp: 17  Temp: 97.8 F (36.6 C)  SpO2: 100%     Body mass index is 29.91 kg/m.   Wt Readings from Last 3 Encounters:  04/27/19 158 lb 4.8 oz (71.8 kg)  08/02/18 149 lb 6.4 oz (67.8 kg)  07/12/18 144 lb 9.6 oz (65.6 kg)      ECOG FS:1 - Symptomatic but completely ambulatory  Ocular: Sclerae unicteric, pupils round and equal Ear-nose-throat: Wearing a mask Lymphatic: No cervical or supraclavicular adenopathy Lungs  no rales or rhonchi Heart regular rate and rhythm Abd soft, nontender, positive bowel sounds MSK no focal spinal tenderness Neuro: non-focal, well-oriented, appropriate affect Breasts: No suspicious masses palpated.  No skin or nipple changes of concern.  Both axillae are benign.  LAB RESULTS:  CMP     Component Value Date/Time   NA 137 03/02/2018 1610   K 4.4 03/02/2018 1610   CL 103 03/02/2018 1610   CO2 28 03/02/2018 1610   GLUCOSE 78 03/02/2018 1610   BUN 12 03/02/2018 1610   CREATININE 0.68 03/02/2018 1610   CALCIUM 9.5 03/02/2018 1610   PROT 7.2 03/02/2018 1610   ALBUMIN 3.9 11/11/2016 1624   AST 80 (H) 03/02/2018 1610   ALT 148 (H) 03/02/2018 1610   ALKPHOS 180 (H) 11/11/2016 1624   BILITOT 0.4 03/02/2018 1610   GFRNONAA 106 03/02/2018 1610   GFRAA 122 03/02/2018 1610    No results found for: TOTALPROTELP, ALBUMINELP, A1GS, A2GS, BETS, BETA2SER, GAMS, MSPIKE, SPEI  No results found for: KPAFRELGTCHN, LAMBDASER, KAPLAMBRATIO  Lab Results  Component Value Date   WBC 8.9 03/02/2018   NEUTROABS 4,931 03/02/2018   HGB 12.0 03/02/2018   HCT 35.0 03/02/2018   MCV 84.7 03/02/2018   PLT 410 (H) 03/02/2018    _0 @  No results found for: LABCA2  No components found for: IWLNLG921  No results for  input(s): INR in the last 168 hours.  No results found for: LABCA2  No results found for: JHE174  No results found for: YCX448  No results found for: JEH631  No results found for: CA2729  No components found for: HGQUANT  No results found for: CEA1 / No results found for: CEA1   No results found for: AFPTUMOR  No results found for: CHROMOGRNA  No results found for: PSA1  No visits with results within 3 Day(s) from this visit.  Latest known visit with results is:  Office Visit on 03/02/2018  Component Date Value Ref Range Status  . WBC 03/02/2018 8.9  3.8 - 10.8 Thousand/uL Final  . RBC 03/02/2018 4.13  3.80 - 5.10 Million/uL Final  . Hemoglobin 03/02/2018 12.0  11.7 - 15.5 g/dL Final  . HCT 03/02/2018 35.0  35.0 - 45.0 % Final  . MCV 03/02/2018 84.7  80.0 - 100.0 fL Final  . MCH 03/02/2018 29.1  27.0 - 33.0 pg Final  . MCHC 03/02/2018 34.3  32.0 - 36.0 g/dL Final  . RDW 03/02/2018 12.6  11.0 - 15.0 % Final  . Platelets 03/02/2018 410* 140 - 400 Thousand/uL Final  . MPV 03/02/2018 9.2  7.5 - 12.5 fL Final  . Neutro Abs 03/02/2018 4,931  1,500 - 7,800 cells/uL Final  . Lymphs Abs 03/02/2018 3,231  850 - 3,900 cells/uL Final  . WBC mixed population 03/02/2018 605  200 - 950 cells/uL Final  . Eosinophils Absolute 03/02/2018 80  15 - 500 cells/uL Final  . Basophils Absolute 03/02/2018 53  0 - 200 cells/uL Final  . Neutrophils Relative % 03/02/2018 55.4  % Final  . Total Lymphocyte 03/02/2018 36.3  % Final  . Monocytes Relative 03/02/2018 6.8  % Final  . Eosinophils Relative 03/02/2018 0.9  % Final  . Basophils Relative 03/02/2018 0.6  % Final  . Glucose, Bld 03/02/2018 78  65 - 99 mg/dL Final   Comment: .            Fasting reference interval .   . BUN 03/02/2018 12  7 - 25 mg/dL Final  .  Creat 03/02/2018 0.68  0.50 - 1.10 mg/dL Final  . GFR, Est Non African American 03/02/2018 106  > OR = 60 mL/min/1.66m Final  . GFR, Est African American 03/02/2018 122  > OR = 60  mL/min/1.793mFinal  . BUN/Creatinine Ratio 1016/10/9604OT APPLICABLE  6 - 22 (calc) Final  . Sodium 03/02/2018 137  135 - 146 mmol/L Final  . Potassium 03/02/2018 4.4  3.5 - 5.3 mmol/L Final  . Chloride 03/02/2018 103  98 - 110 mmol/L Final  . CO2 03/02/2018 28  20 - 32 mmol/L Final  . Calcium 03/02/2018 9.5  8.6 - 10.2 mg/dL Final  . Total Protein 03/02/2018 7.2  6.1 - 8.1 g/dL Final  . Albumin 03/02/2018 4.0  3.6 - 5.1 g/dL Final  . Globulin 03/02/2018 3.2  1.9 - 3.7 g/dL (calc) Final  . AG Ratio 03/02/2018 1.3  1.0 - 2.5 (calc) Final  . Total Bilirubin 03/02/2018 0.4  0.2 - 1.2 mg/dL Final  . Alkaline phosphatase (APISO) 03/02/2018 117* 33 - 115 U/L Final  . AST 03/02/2018 80* 10 - 35 U/L Final  . ALT 03/02/2018 148* 6 - 29 U/L Final  . B burgdorferi Ab IgG+IgM 03/02/2018 <0.90  index Final   Comment:                    Index                Interpretation                    -----                --------------                    < 0.90               Negative                    0.90-1.09            Equivocal                    > 1.09               Positive . As recommended by the Food and Drug Administration  (FDA), all samples with positive or equivocal  results in a Borrelia burgdorferi antibody screen will be tested using a blot method. Positive or  equivocal screening test results should not be  interpreted as truly positive until verified as such  using a supplemental assay (e.g., B. burgdorferi blot). . The screening test and/or blot for B. burgdorferi  antibodies may be falsely negative in early stages of Lyme disease, including the period when erythema  migrans is apparent. .   . RMSF IgG 03/02/2018 NOT DETECTED  NOT DETECT Final  . RMSF IgM 03/02/2018 NOT DETECTED  NOT DETECT Final  . Sed Rate 03/02/2018 22* 0 - 20 mm/h Final  . EBV VCA IgM 03/02/2018 <36.00  U/mL Final   Comment:       U/mL              Interpretation       ----              --------------        <36.00            Negative       36.00-43.99  Equivocal       >43.99            Positive   . EBV VCA IgG 03/02/2018 >750.00* U/mL Final   Comment:        U/mL             Interpretation        ----             --------------        <18.00           Negative        18.00-21.99      Equivocal        >21.99           Positive   . EBV NA IgG 03/02/2018 162.00* U/mL Final   Comment:        U/mL             Interpretation        ----             --------------        <18.00           Negative        18.00-21.99      Equivocal        >21.99           Positive   . Interpretation 03/02/2018    Final   Comment: . Suggestive of a past Epstein-Barr virus infection. In infants, a similar pattern may occur as a result of passive maternal transfer of antibody. .     (this displays the last labs from the last 3 days)  No results found for: TOTALPROTELP, ALBUMINELP, A1GS, A2GS, BETS, BETA2SER, GAMS, MSPIKE, SPEI (this displays SPEP labs)  No results found for: KPAFRELGTCHN, LAMBDASER, KAPLAMBRATIO (kappa/lambda light chains)  No results found for: HGBA, HGBA2QUANT, HGBFQUANT, HGBSQUAN (Hemoglobinopathy evaluation)   No results found for: LDH  No results found for: IRON, TIBC, IRONPCTSAT (Iron and TIBC)  No results found for: FERRITIN  Urinalysis    Component Value Date/Time   COLORURINE YELLOW 03/02/2016 0132   APPEARANCEUR CLEAR 03/02/2016 0132   LABSPEC 1.002 (L) 03/02/2016 0132   PHURINE 7.0 03/02/2016 0132   GLUCOSEU NEGATIVE 03/02/2016 0132   HGBUR NEGATIVE 03/02/2016 0132   BILIRUBINUR NEGATIVE 03/02/2016 0132   BILIRUBINUR NEG 10/11/2012 1728   KETONESUR NEGATIVE 03/02/2016 0132   PROTEINUR NEGATIVE 03/02/2016 0132   UROBILINOGEN 0.2 10/12/2012 1417   NITRITE NEGATIVE 03/02/2016 0132   LEUKOCYTESUR NEGATIVE 03/02/2016 0132     STUDIES: No results found.  ELIGIBLE FOR AVAILABLE RESEARCH PROTOCOL: no  ASSESSMENT: 48 y.o. 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 Specifically we reviewed the biology of the patient's diagnosis and the specifics of her situation.  She understands that we inherit 1 copy of each one of our 23,000 genes from each of our parents.  We discussed the ATM gene and she understands that it is involved in DNA repair.  In patients who have mutations in both ATM genes, a condition called ataxia-telangiectasia may develop.  This is associated with high risk of multiple cancers.  In patients who carry only 1 mutated ATM gene, there is increased risk of breast and ovarian cancer but the magnitude of the risk is very difficult to determine.  The amount of data is relatively scant, there are many different ATM mutations in each may well have a different risk associated with it, and finally there is ascertainment bias and that a patient like herself for example with a known mutation will do a much greater research in the family history than patient not known to have a mutation.  Given the limitations of the data, we discussed ovarian and breast cancer separately.  As far as ovarian cancer is concerned she understands we do not have validated screening protocols.  We do often perform bi-annual ovarian ultrasonography, but we do not have data that this saves lives or even finds ovarian cancer at earlier stages.  Accordingly her choice there is relatively simple.  She can accept the risk, which is likely not very great (there is no family history of ovarian cancer) or she can proceed to bilateral salpingo-oophorectomy.  We did discuss that BSO is a relatively straightforward  and usually uncomplicated outpatient procedure and if she wishes to consider this further she will discuss it with Dr. Matthew [name].  As far as breast cancer is concerned she has both intensified screening and risk reduction strategies available.  With regards to intensified screening she was very concerned because she has experienced claustrophobia with the prior MRI.  However breast MRI uses a different technique and the whole body is not encased in a small area, only the breasts are.  I think she would be able to tolerate this without great difficulty but it may be reasonable to prescribe some Valium for her which I will be glad to do.  Since she will be having her mammogram shortly, we will schedule the breast MRI for June.  With regards to risk reduction I did not recommend bilateral mastectomies in her case.  I think particularly given her history of autoimmune disease there could be multiple concerns and complications.  She could consider antiestrogens and we discussed anastrozole specifically.  She has a good understanding of the possible toxicities side effects and complications.  However she tells me she had unbearable hot flashes and mood changes when she became menopausal.  That is why she is on estrogen at present.  After much discussion we decided she would try to wean herself off the estrogen over a 9-week.  Namely she will take 1 mg for the next 3 weeks, 0.5 mg for 3 weeks after that, and then 0.5 mg every other day for the next 3 weeks.  If she has significant issues with hot flashes she will call us and we will consider adding venlafaxine.  If she is able to wean herself off estrogen by early February she will call me and we will consider starting an aromatase inhibitor at that time.  She would then see me sometime in June after her breast MRI  Debbie [name] has a good understanding of the overall plan. She agrees with it. She knows the goal of treatment in her case is prevention. She will call  with any problems that may develop before her next visit here.   Debbie Cruel, MD   04/27/2019 6:01 PM Medical Oncology and Hematology [facility] [place], [place] [address] Tel. [phone]    Fax. [phone]   This document serves as a record of services personally performed by Sarajane Jews  Orly Quimby, MD. It was created on his behalf by Wilburn Mylar, a trained medical scribe. The creation of this record is based on the scribe's personal observations and the provider's statements to them.   I, Lurline Del MD, have reviewed the above documentation for accuracy and completeness, and I agree with the above.

## 2019-04-27 ENCOUNTER — Other Ambulatory Visit: Payer: Self-pay

## 2019-04-27 ENCOUNTER — Inpatient Hospital Stay: Payer: BC Managed Care – PPO | Attending: Oncology | Admitting: Oncology

## 2019-04-27 VITALS — BP 122/83 | HR 94 | Temp 97.8°F | Resp 17 | Ht 61.0 in | Wt 158.3 lb

## 2019-04-27 DIAGNOSIS — Z9189 Other specified personal risk factors, not elsewhere classified: Secondary | ICD-10-CM | POA: Diagnosis not present

## 2019-04-27 DIAGNOSIS — Z1239 Encounter for other screening for malignant neoplasm of breast: Secondary | ICD-10-CM

## 2019-04-27 DIAGNOSIS — M1990 Unspecified osteoarthritis, unspecified site: Secondary | ICD-10-CM | POA: Insufficient documentation

## 2019-04-27 DIAGNOSIS — K219 Gastro-esophageal reflux disease without esophagitis: Secondary | ICD-10-CM | POA: Insufficient documentation

## 2019-04-27 DIAGNOSIS — Z801 Family history of malignant neoplasm of trachea, bronchus and lung: Secondary | ICD-10-CM | POA: Insufficient documentation

## 2019-04-27 DIAGNOSIS — M0579 Rheumatoid arthritis with rheumatoid factor of multiple sites without organ or systems involvement: Secondary | ICD-10-CM

## 2019-04-27 DIAGNOSIS — Z79899 Other long term (current) drug therapy: Secondary | ICD-10-CM | POA: Insufficient documentation

## 2019-04-27 DIAGNOSIS — Z800 Family history of malignant neoplasm of digestive organs: Secondary | ICD-10-CM | POA: Diagnosis not present

## 2019-04-27 DIAGNOSIS — M3219 Other organ or system involvement in systemic lupus erythematosus: Secondary | ICD-10-CM

## 2019-04-27 DIAGNOSIS — Z809 Family history of malignant neoplasm, unspecified: Secondary | ICD-10-CM | POA: Insufficient documentation

## 2019-04-27 DIAGNOSIS — M329 Systemic lupus erythematosus, unspecified: Secondary | ICD-10-CM | POA: Diagnosis not present

## 2019-04-27 MED ORDER — DIAZEPAM 5 MG PO TABS: 5.0000 mg | ORAL_TABLET | ORAL | 0 refills | Status: DC | PRN

## 2019-05-02 ENCOUNTER — Encounter: Payer: Self-pay | Admitting: *Deleted

## 2019-06-21 NOTE — H&P (Signed)
NAME: Debbie [name], Debbie [name] MEDICAL RECORD JF:[id number] ACCOUNT [id number] DATE OF BIRTH:21-Aug-1970 FACILITY: [facility] LOCATION:  PHYSICIAN:Calbert Hulsebus Garry Heater, MD  HISTORY AND PHYSICAL  DATE OF ADMISSION:  07/21/2019  CHIEF COMPLAINT:  Laparoscopic BSO, increased cancer risk.  HISTORY OF PRESENT ILLNESS:  A 49 year old G2 P2.  She has had laparoscopy x2 in the past, followed by supracervical hysterectomy.  Due to a family history, she had a genetic screening done recently that showed she carries an ATM mutation that makes her  at increased risk of breast and ovarian cancer.  She had met with Dr. Jana Hakim who discussed a number of options for reduction of her risks including BSO and also weaning down and stopping her estrogen, which she is in the process of the later.  She  presents at this time for laparoscopic BSO.  This procedure including risk of bleeding, infection, adjacent organ injury, the possible need to complete the surgery by open technique, along with her recovery time all discussed with her, which she  understands and accepts.  PAST MEDICAL HISTORY:  ALLERGIES:  None.  MEDICATIONS:  Astelin nasal spray, Flexeril p.r.n., Nexium 40 mg daily, estradiol patch 0.5, gabapentin 100 mg as needed, hydroxychloroquine 200 mg daily, Singulair daily, MiraLax as needed.  REVIEW OF SYSTEMS:  She has a history of IBS, lupus, RA, postural orthostatic tachycardia  syndrome.  PAST SURGICAL HISTORY:  She has had supracervical hysterectomy laparoscopically, that was followed by a trachelectomy to remove the cervical stump.  She has also had lithotripsy, cholecystectomy.  SOCIAL HISTORY:  She is married.  Denies tobacco use, occasional alcohol use.  FAMILY HISTORY:  Significant for mother with hypertension, father with diabetes.  Daughter with rheumatoid arthritis and Hashimoto thyroiditis.  PHYSICAL EXAMINATION: VITAL SIGNS:  Blood pressure 112/76, BMI 29.6. HEENT:  Unremarkable. NECK:  Supple,  without masses, thyroid nonpalpable.   HEART AND LUNGS:  Clear. BREASTS:  Without masses, tenderness or nipple discharge. ABDOMEN:  Soft, flat, nontender. PELVIC:  Vulva, vagina, vaginal cuff normal.  Bimanual negative. EXTREMITIES AND NEUROLOGIC:  Unremarkable.  IMPRESSION:  A 49 year old with a prior hysterectomy, positive ATM gene making her at increased risk of ovarian and breast cancer, here today for  laparoscopic bilateral salpingo-oophorectomy.  Procedure and risks discussed as above.  VN/NUANCE  D:06/20/2019 T:06/20/2019 JOB:[id number]

## 2019-07-07 ENCOUNTER — Other Ambulatory Visit: Payer: Self-pay

## 2019-07-07 ENCOUNTER — Encounter (HOSPITAL_BASED_OUTPATIENT_CLINIC_OR_DEPARTMENT_OTHER): Payer: Self-pay | Admitting: Obstetrics and Gynecology

## 2019-07-07 DIAGNOSIS — L989 Disorder of the skin and subcutaneous tissue, unspecified: Secondary | ICD-10-CM

## 2019-07-07 HISTORY — DX: Disorder of the skin and subcutaneous tissue, unspecified: L98.9

## 2019-07-07 NOTE — Progress Notes (Addendum)
Spoke w/ via phone for pre-op interview---[facility] needs dos---- type and screen             COVID test ------07-09-2019 Arrive at -------530 am 07-13-2019 NPO after ------midnight Medications to take morning of surgery -----plaquenil, certrizine, nexium, mybetriq (spoke with Janett Billow zanetto patient may stay on plaquenil unless instructed by surgeon or prescriber to stop for surgery) Diabetic medication -----n/a Patient Special Instructions ----- Pre-Op special Istructions ----- Patient verbalized understanding of instructions that were given at this phone interview. Patient denies shortness of breath, chest pain, fever, cough a this phone interview.  lov dr Marjie Skiff 07-12-2018 epic, lov cardiology dr Margaretann Loveless 08-02-2018 (follow up prn), holter monitor 07-21-2018 epic Echo 06-21-2018 epic

## 2019-07-09 ENCOUNTER — Other Ambulatory Visit (HOSPITAL_COMMUNITY)
Admission: RE | Admit: 2019-07-09 | Discharge: 2019-07-09 | Disposition: A | Discharge status: Home / Self Care | Payer: BC Managed Care – PPO | Source: Ambulatory Visit | Attending: Obstetrics and Gynecology | Admitting: Obstetrics and Gynecology

## 2019-07-09 DIAGNOSIS — Z01812 Encounter for preprocedural laboratory examination: Secondary | ICD-10-CM | POA: Diagnosis present

## 2019-07-09 DIAGNOSIS — Z20822 Contact with and (suspected) exposure to covid-19: Secondary | ICD-10-CM | POA: Diagnosis not present

## 2019-07-09 LAB — SARS CORONAVIRUS 2 (TAT 6-24 HRS): SARS Coronavirus 2: NEGATIVE

## 2019-07-12 NOTE — Anesthesia Preprocedure Evaluation (Addendum)
Anesthesia Evaluation  Patient identified by MRN, date of birth, ID band Patient awake  General Assessment Comment:Unsure if she truly has allergies to zofran, reglan  Reviewed: Allergy & Precautions, NPO status , Patient's Chart, lab work & pertinent test results  History of Anesthesia Complications Negative for: history of anesthetic complications  Airway Mallampati: I  TM Distance: >3 FB Neck ROM: Full    Dental  (+) Teeth Intact, Dental Advisory Given   Pulmonary neg pulmonary ROS,    Pulmonary exam normal breath sounds clear to auscultation       Cardiovascular Exercise Tolerance: Good Normal cardiovascular exam Rhythm:Regular Rate:Tachycardia  Has been worked up for POTS recently- experiences occasional palpitations and SOB. Experiences swings in her BP along with these other symptoms.    Neuro/Psych  Headaches, PSYCHIATRIC DISORDERS (Anorexia nervosa) Hx eating d/o, denies anxietyHas been worked up for POTS recently d/t palpitations, SOB, right hand tremor- per patient, was started on gabapentin to help with POTS? But has not been taking it every day    GI/Hepatic Neg liver ROS, GERD  Medicated and Controlled,Gastroparesis, anorexia with bulimia. Denies any true heartburn but admits that her food comes back up entirely at times. 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 recently- pt unsure of whether her tremors and palpitations are due to this or not    Musculoskeletal  (+) Arthritis , Osteoarthritis,    Abdominal Normal abdominal exam  (+)   Peds  Hematology  (+) anemia ,   Anesthesia Other Findings   Reproductive/Obstetrics negative OB ROS                            Anesthesia Physical  Anesthesia Plan  ASA: II  Anesthesia Plan:  General   Post-op Pain Management:    Induction: Intravenous, Rapid sequence and Cricoid pressure planned  PONV Risk Score and Plan: 4 or greater and Dexamethasone, Midazolam, Scopolamine patch - Pre-op, Diphenhydramine, Propofol infusion and Promethazine  Airway Management Planned: Oral ETT  Additional Equipment: None  Intra-op Plan:   Post-operative Plan: Extubation in OR  Informed Consent: I have reviewed the patients History and Physical, chart, labs and discussed the procedure including the risks, benefits and alternatives for the proposed anesthesia with the patient or authorized representative who has indicated his/her understanding and acceptance.     Dental advisory given  Plan Discussed with: CRNA  Anesthesia Plan Comments:       Anesthesia Quick Evaluation

## 2019-07-13 ENCOUNTER — Ambulatory Visit (HOSPITAL_BASED_OUTPATIENT_CLINIC_OR_DEPARTMENT_OTHER): Payer: BC Managed Care – PPO | Admitting: Certified Registered"

## 2019-07-13 ENCOUNTER — Encounter (HOSPITAL_BASED_OUTPATIENT_CLINIC_OR_DEPARTMENT_OTHER): Payer: Self-pay | Admitting: Obstetrics and Gynecology

## 2019-07-13 ENCOUNTER — Other Ambulatory Visit: Payer: Self-pay

## 2019-07-13 ENCOUNTER — Encounter (HOSPITAL_BASED_OUTPATIENT_CLINIC_OR_DEPARTMENT_OTHER)
Admission: RE | Disposition: A | Discharge status: Home / Self Care | Payer: Self-pay | Source: Home / Self Care | Attending: Obstetrics and Gynecology

## 2019-07-13 ENCOUNTER — Ambulatory Visit (HOSPITAL_BASED_OUTPATIENT_CLINIC_OR_DEPARTMENT_OTHER)
Admission: RE | Admit: 2019-07-13 | Discharge: 2019-07-13 | Disposition: A | Discharge status: Home / Self Care | Payer: BC Managed Care – PPO | Attending: Obstetrics and Gynecology | Admitting: Obstetrics and Gynecology

## 2019-07-13 DIAGNOSIS — K219 Gastro-esophageal reflux disease without esophagitis: Secondary | ICD-10-CM | POA: Insufficient documentation

## 2019-07-13 DIAGNOSIS — Z90710 Acquired absence of both cervix and uterus: Secondary | ICD-10-CM | POA: Diagnosis not present

## 2019-07-13 DIAGNOSIS — Z79890 Hormone replacement therapy (postmenopausal): Secondary | ICD-10-CM | POA: Insufficient documentation

## 2019-07-13 DIAGNOSIS — Z9189 Other specified personal risk factors, not elsewhere classified: Secondary | ICD-10-CM

## 2019-07-13 DIAGNOSIS — Z1501 Genetic susceptibility to malignant neoplasm of breast: Secondary | ICD-10-CM | POA: Insufficient documentation

## 2019-07-13 DIAGNOSIS — Z4002 Encounter for prophylactic removal of ovary: Secondary | ICD-10-CM | POA: Insufficient documentation

## 2019-07-13 DIAGNOSIS — N838 Other noninflammatory disorders of ovary, fallopian tube and broad ligament: Secondary | ICD-10-CM | POA: Diagnosis not present

## 2019-07-13 DIAGNOSIS — Z79899 Other long term (current) drug therapy: Secondary | ICD-10-CM | POA: Diagnosis not present

## 2019-07-13 DIAGNOSIS — M329 Systemic lupus erythematosus, unspecified: Secondary | ICD-10-CM | POA: Diagnosis not present

## 2019-07-13 DIAGNOSIS — Z1502 Genetic susceptibility to malignant neoplasm of ovary: Secondary | ICD-10-CM | POA: Diagnosis not present

## 2019-07-13 HISTORY — DX: Personal history of urinary calculi: Z87.442

## 2019-07-13 HISTORY — PX: LAPAROSCOPIC BILATERAL SALPINGO OOPHERECTOMY: SHX5890

## 2019-07-13 HISTORY — DX: Other specified cardiac arrhythmias: I49.8

## 2019-07-13 HISTORY — DX: Postural orthostatic tachycardia syndrome (POTS): G90.A

## 2019-07-13 HISTORY — PX: CYSTOSCOPY: SHX5120

## 2019-07-13 LAB — ABO/RH: ABO/RH(D): O POS

## 2019-07-13 LAB — TYPE AND SCREEN
ABO/RH(D): O POS
Antibody Screen: NEGATIVE

## 2019-07-13 SURGERY — SALPINGO-OOPHORECTOMY, BILATERAL, LAPAROSCOPIC
Anesthesia: General | Site: Abdomen | Laterality: Bilateral

## 2019-07-13 MED ORDER — PROPOFOL 10 MG/ML IV BOLUS
INTRAVENOUS | Status: AC
  Filled 2019-07-13: qty 40

## 2019-07-13 MED ORDER — ACETAMINOPHEN 500 MG PO TABS
ORAL_TABLET | ORAL | Status: AC
  Filled 2019-07-13: qty 2

## 2019-07-13 MED ORDER — HYDROMORPHONE HCL 1 MG/ML IJ SOLN
0.2500 mg | INTRAMUSCULAR | Status: DC | PRN
  Administered 2019-07-13 (×3): 0.25 mg via INTRAVENOUS
  Filled 2019-07-13: qty 0.5

## 2019-07-13 MED ORDER — SUCCINYLCHOLINE CHLORIDE 200 MG/10ML IV SOSY
PREFILLED_SYRINGE | INTRAVENOUS | Status: AC
  Filled 2019-07-13: qty 10

## 2019-07-13 MED ORDER — KETOROLAC TROMETHAMINE 30 MG/ML IJ SOLN
30.0000 mg | Freq: Once | INTRAMUSCULAR | Status: DC | PRN
  Filled 2019-07-13: qty 1

## 2019-07-13 MED ORDER — SUCCINYLCHOLINE CHLORIDE 200 MG/10ML IV SOSY
PREFILLED_SYRINGE | INTRAVENOUS | Status: DC | PRN
  Administered 2019-07-13: 80 mg via INTRAVENOUS

## 2019-07-13 MED ORDER — OXYCODONE HCL 5 MG/5ML PO SOLN
5.0000 mg | Freq: Once | ORAL | Status: AC | PRN
  Filled 2019-07-13: qty 5

## 2019-07-13 MED ORDER — SODIUM CHLORIDE 0.9 % IV SOLN
2.0000 g | INTRAVENOUS | Status: AC
  Administered 2019-07-13: 2 g via INTRAVENOUS
  Filled 2019-07-13: qty 2

## 2019-07-13 MED ORDER — PROPOFOL 500 MG/50ML IV EMUL
INTRAVENOUS | Status: DC | PRN
  Administered 2019-07-13: 25 ug/kg/min via INTRAVENOUS

## 2019-07-13 MED ORDER — LIDOCAINE 2% (20 MG/ML) 5 ML SYRINGE
INTRAMUSCULAR | Status: AC
  Filled 2019-07-13: qty 5

## 2019-07-13 MED ORDER — PROMETHAZINE HCL 25 MG/ML IJ SOLN
6.2500 mg | INTRAMUSCULAR | Status: DC | PRN
  Filled 2019-07-13: qty 1

## 2019-07-13 MED ORDER — KETOROLAC TROMETHAMINE 30 MG/ML IJ SOLN
INTRAMUSCULAR | Status: DC | PRN
  Administered 2019-07-13: 30 mg via INTRAVENOUS

## 2019-07-13 MED ORDER — FENTANYL CITRATE (PF) 100 MCG/2ML IJ SOLN
INTRAMUSCULAR | Status: AC
  Filled 2019-07-13: qty 2

## 2019-07-13 MED ORDER — SCOPOLAMINE 1 MG/3DAYS TD PT72
MEDICATED_PATCH | TRANSDERMAL | Status: AC
  Filled 2019-07-13: qty 1

## 2019-07-13 MED ORDER — MEPERIDINE HCL 25 MG/ML IJ SOLN
6.2500 mg | INTRAMUSCULAR | Status: DC | PRN
  Filled 2019-07-13: qty 1

## 2019-07-13 MED ORDER — DIPHENHYDRAMINE HCL 50 MG/ML IJ SOLN
INTRAMUSCULAR | Status: AC
  Filled 2019-07-13: qty 1

## 2019-07-13 MED ORDER — BUPIVACAINE HCL (PF) 0.25 % IJ SOLN
INTRAMUSCULAR | Status: DC | PRN
  Administered 2019-07-13: 25 mL

## 2019-07-13 MED ORDER — PROMETHAZINE HCL 25 MG/ML IJ SOLN
INTRAMUSCULAR | Status: AC
  Filled 2019-07-13: qty 1

## 2019-07-13 MED ORDER — ROCURONIUM BROMIDE 10 MG/ML (PF) SYRINGE
PREFILLED_SYRINGE | INTRAVENOUS | Status: DC | PRN
  Administered 2019-07-13: 30 mg via INTRAVENOUS
  Administered 2019-07-13: 20 mg via INTRAVENOUS

## 2019-07-13 MED ORDER — DEXAMETHASONE SODIUM PHOSPHATE 10 MG/ML IJ SOLN
INTRAMUSCULAR | Status: AC
  Filled 2019-07-13: qty 1

## 2019-07-13 MED ORDER — LACTATED RINGERS IV SOLN
INTRAVENOUS | Status: DC
  Filled 2019-07-13: qty 1000

## 2019-07-13 MED ORDER — SUGAMMADEX SODIUM 200 MG/2ML IV SOLN
INTRAVENOUS | Status: DC | PRN
  Administered 2019-07-13: 150 mg via INTRAVENOUS

## 2019-07-13 MED ORDER — OXYCODONE-ACETAMINOPHEN 5-325 MG PO TABS: 1.0000 | ORAL_TABLET | Freq: Four times a day (QID) | ORAL | 0 refills | Status: DC | PRN

## 2019-07-13 MED ORDER — ACETAMINOPHEN 500 MG PO TABS
1000.0000 mg | ORAL_TABLET | Freq: Once | ORAL | Status: AC
  Administered 2019-07-13: 06:00:00 1000 mg via ORAL
  Filled 2019-07-13: qty 2

## 2019-07-13 MED ORDER — KETOROLAC TROMETHAMINE 30 MG/ML IJ SOLN
INTRAMUSCULAR | Status: AC
  Filled 2019-07-13: qty 1

## 2019-07-13 MED ORDER — OXYCODONE HCL 5 MG PO TABS
5.0000 mg | ORAL_TABLET | Freq: Once | ORAL | Status: AC | PRN
  Administered 2019-07-13: 11:00:00 5 mg via ORAL
  Filled 2019-07-13: qty 1

## 2019-07-13 MED ORDER — PROPOFOL 10 MG/ML IV BOLUS
INTRAVENOUS | Status: DC | PRN
  Administered 2019-07-13: 200 mg via INTRAVENOUS

## 2019-07-13 MED ORDER — ONDANSETRON HCL 4 MG/2ML IJ SOLN
INTRAMUSCULAR | Status: AC
  Filled 2019-07-13: qty 2

## 2019-07-13 MED ORDER — HYDROMORPHONE HCL 1 MG/ML IJ SOLN
INTRAMUSCULAR | Status: AC
  Filled 2019-07-13: qty 1

## 2019-07-13 MED ORDER — OXYCODONE HCL 5 MG PO TABS
ORAL_TABLET | ORAL | Status: AC
  Filled 2019-07-13: qty 1

## 2019-07-13 MED ORDER — PROPOFOL 500 MG/50ML IV EMUL
INTRAVENOUS | Status: AC
  Filled 2019-07-13: qty 50

## 2019-07-13 MED ORDER — LIDOCAINE 2% (20 MG/ML) 5 ML SYRINGE
INTRAMUSCULAR | Status: DC | PRN
  Administered 2019-07-13: 60 mg via INTRAVENOUS

## 2019-07-13 MED ORDER — MIDAZOLAM HCL 2 MG/2ML IJ SOLN
INTRAMUSCULAR | Status: DC | PRN
  Administered 2019-07-13: 2 mg via INTRAVENOUS

## 2019-07-13 MED ORDER — DEXAMETHASONE SODIUM PHOSPHATE 10 MG/ML IJ SOLN
INTRAMUSCULAR | Status: DC | PRN
  Administered 2019-07-13: 10 mg via INTRAVENOUS

## 2019-07-13 MED ORDER — ROCURONIUM BROMIDE 10 MG/ML (PF) SYRINGE
PREFILLED_SYRINGE | INTRAVENOUS | Status: AC
  Filled 2019-07-13: qty 10

## 2019-07-13 MED ORDER — FENTANYL CITRATE (PF) 250 MCG/5ML IJ SOLN
INTRAMUSCULAR | Status: DC | PRN
  Administered 2019-07-13 (×4): 50 ug via INTRAVENOUS

## 2019-07-13 MED ORDER — STERILE WATER FOR IRRIGATION IR SOLN
Status: DC | PRN
  Administered 2019-07-13: 08:00:00 5 mL via INTRAVESICAL

## 2019-07-13 MED ORDER — MIDAZOLAM HCL 2 MG/2ML IJ SOLN
INTRAMUSCULAR | Status: AC
  Filled 2019-07-13: qty 2

## 2019-07-13 MED ORDER — SODIUM CHLORIDE 0.9 % IV SOLN
INTRAVENOUS | Status: AC
  Filled 2019-07-13: qty 2

## 2019-07-13 MED ORDER — SCOPOLAMINE 1 MG/3DAYS TD PT72
1.0000 | MEDICATED_PATCH | TRANSDERMAL | Status: DC
  Administered 2019-07-13: 1.5 mg via TRANSDERMAL
  Filled 2019-07-13: qty 1

## 2019-07-13 SURGICAL SUPPLY — 44 items
ADH SKN CLS APL DERMABOND .7 (GAUZE/BANDAGES/DRESSINGS) ×1
BAG RETRIEVAL 10MM (BASKET)
BLADE CLIPPER SENSICLIP SURGIC (BLADE) IMPLANT
CANISTER SUCT 3000ML PPV (MISCELLANEOUS) IMPLANT
CATH ROBINSON RED A/P 14FR (CATHETERS) ×2 IMPLANT
CATH ROBINSON RED A/P 16FR (CATHETERS) ×3 IMPLANT
COVER MAYO STAND STRL (DRAPES) ×3 IMPLANT
COVER WAND RF STERILE (DRAPES) ×3 IMPLANT
DERMABOND ADVANCED (GAUZE/BANDAGES/DRESSINGS) ×2
DERMABOND ADVANCED .7 DNX12 (GAUZE/BANDAGES/DRESSINGS) IMPLANT
DRSG COVADERM PLUS 2X2 (GAUZE/BANDAGES/DRESSINGS) IMPLANT
DRSG OPSITE POSTOP 3X4 (GAUZE/BANDAGES/DRESSINGS) IMPLANT
DURAPREP 26ML APPLICATOR (WOUND CARE) ×3 IMPLANT
ELECT REM PT RETURN 9FT ADLT (ELECTROSURGICAL) ×3
ELECTRODE REM PT RTRN 9FT ADLT (ELECTROSURGICAL) ×1 IMPLANT
GAUZE 4X4 16PLY RFD (DISPOSABLE) ×3 IMPLANT
GLOVE BIO SURGEON STRL SZ7 (GLOVE) ×6 IMPLANT
GOWN STRL REUS W/ TWL LRG LVL3 (GOWN DISPOSABLE) ×1 IMPLANT
GOWN STRL REUS W/TWL LRG LVL3 (GOWN DISPOSABLE) ×3
KIT TURNOVER CYSTO (KITS) ×3 IMPLANT
NEEDLE INSUFFLATION 120MM (ENDOMECHANICALS) ×3 IMPLANT
NS IRRIG 500ML POUR BTL (IV SOLUTION) ×3 IMPLANT
PACK LAPAROSCOPY BASIN (CUSTOM PROCEDURE TRAY) ×3 IMPLANT
PACK TRENDGUARD 450 HYBRID PRO (MISCELLANEOUS) IMPLANT
PAD OB MATERNITY 4.3X12.25 (PERSONAL CARE ITEMS) ×3 IMPLANT
PAD PREP 24X48 CUFFED NSTRL (MISCELLANEOUS) ×3 IMPLANT
SCISSORS LAP 5X35 DISP (ENDOMECHANICALS) IMPLANT
SEALER TISSUE G2 CVD JAW 45CM (ENDOMECHANICALS) ×2 IMPLANT
SET IRRIG TUBING LAPAROSCOPIC (IRRIGATION / IRRIGATOR) IMPLANT
SET IRRIG Y TYPE TUR BLADDER L (SET/KITS/TRAYS/PACK) ×2 IMPLANT
SET TUBE SMOKE EVAC HIGH FLOW (TUBING) ×3 IMPLANT
SHEARS HARMONIC ACE PLUS 45CM (MISCELLANEOUS) IMPLANT
SUT MNCRL AB 4-0 PS2 18 (SUTURE) ×3 IMPLANT
SUT VIC AB 2-0 UR6 27 (SUTURE) ×2 IMPLANT
SUT VICRYL 0 UR6 27IN ABS (SUTURE) IMPLANT
SYR 10ML LL (SYRINGE) ×3 IMPLANT
SYS BAG RETRIEVAL 10MM (BASKET)
SYSTEM BAG RETRIEVAL 10MM (BASKET) IMPLANT
TOWEL OR 17X26 10 PK STRL BLUE (TOWEL DISPOSABLE) ×6 IMPLANT
TRENDGUARD 450 HYBRID PRO PACK (MISCELLANEOUS)
TROCAR OPTI TIP 5M 100M (ENDOMECHANICALS) ×3 IMPLANT
TROCAR XCEL BLUNT TIP 100MML (ENDOMECHANICALS) IMPLANT
TROCAR XCEL DIL TIP R 11M (ENDOMECHANICALS) ×3 IMPLANT
WARMER LAPAROSCOPE (MISCELLANEOUS) ×3 IMPLANT

## 2019-07-13 NOTE — Transfer of Care (Signed)
Immediate Anesthesia Transfer of Care Note  Patient: Debbie [name]  Procedure(s) Performed: LAPAROSCOPIC BILATERAL SALPINGO OOPHORECTOMY (Bilateral Abdomen) CYSTOSCOPY (Bilateral Abdomen)  Patient Location: PACU  Anesthesia Type:General  Level of Consciousness: drowsy, patient cooperative and responds to stimulation  Airway & Oxygen Therapy: Patient Spontanous Breathing and Patient connected to face mask oxygen  Post-op Assessment: Report given to RN and Post -op Vital signs reviewed and stable  Post vital signs: Reviewed and stable  Last Vitals:  Vitals Value Taken Time  BP 133/78 07/13/19 0902  Temp    Pulse 77 07/13/19 0906  Resp 12 07/13/19 0906  SpO2 97 % 07/13/19 0906  Vitals shown include unvalidated device data.  Last Pain:  Vitals:   07/13/19 0622  TempSrc: Oral  PainSc: 2       Patients Stated Pain Goal: 5 ([id number])  Complications: No apparent anesthesia complications

## 2019-07-13 NOTE — Op Note (Signed)
Preoperative diagnosis: At increased risk for ovarian cancer, she carries an ATM genetic mutation  Postoperative diagnosis: Same  Procedure: Laparoscopic bilateral salpingo-oophorectomy, cystoscopy  Surgeon: Matthew Saras  EBL: Less than 5 cc  Procedure and findings:  Patient was taken to the operating room after an adequate level of general anesthesia was obtained with the patient's legs in stirrups the abdomen perineum and vagina were prepped and draped in the bladder was drained.  She has had a prior hysterectomy.  Appropriate timeouts were taken at that point.  Attention directed to the abdomen the subumbilical area was infiltrated with quarter percent Marcaine plain, small incision was made and the varies needle was introduced without difficulty.  Its intra-abdominal position verified by pressure water testing.  After 2 L pneumoperitoneum was then created, laparoscopic trocar and sleeve were then introduced that difficulty.  There was no evidence of any bleeding or trauma.  3 fingerbreadths above the symphysis in the midline a 5 mm trocar was inserted under direct visualization.  Patient was then placed in Trendelenburg, bilateral tubes and ovaries could be visualized, starting on the right, right tube and ovary were placed on traction with atraumatic grasper, the course of the ureter was well below, the right IP ligament was then coagulated and divided with the Enseal device.  This was hemostatic the ovary was laid in the cul-de-sac temporarily.  The exact same repeated on the left again after identifying the ureter well below.  This area likewise was hemostatic.  The lower incision was enlarged slightly, both ovaries were retrieved through next slight extension of the fascial incision and sent to pathology.  This point the lower incision fascia was closed with a 2-0 Vicryl, the same on the subcu tissue followed by 4-0 Monocryl subcuticular skin closure. Scope was removed after observing one more time  noting the operative site to be hemostatic at reduced pressure.  The umbilical incision closed with 4-0 Monocryl subcuticular.  Both of these areas had previously been infiltrated with local anesthetic.   The cystoscopy was then carried out, Betadine prep, she had received indigo carmine previously, the scope was inserted easily saw ureteral jets from both sides.  Scope was removed no other bladder abnormalities were noted.  In and out catheter to remove the saline irrigant.  She tolerated this well went to recovery room in good condition.  Dictated with Dragon Medical  Margarette Asal MD

## 2019-07-13 NOTE — Progress Notes (Signed)
The patient was re-examined with no change in status 

## 2019-07-13 NOTE — Anesthesia Postprocedure Evaluation (Signed)
Anesthesia Post Note  Patient: Debbie [name]  Procedure(s) Performed: LAPAROSCOPIC BILATERAL SALPINGO OOPHORECTOMY (Bilateral Abdomen) CYSTOSCOPY (Bilateral Abdomen)     Patient location during evaluation: PACU Anesthesia Type: General Level of consciousness: awake and alert, oriented and patient cooperative Pain management: pain level controlled Vital Signs Assessment: post-procedure vital signs reviewed and stable Respiratory status: spontaneous breathing, nonlabored ventilation and respiratory function stable Cardiovascular status: blood pressure returned to baseline and stable Postop Assessment: no apparent nausea or vomiting Anesthetic complications: no    Last Vitals:  Vitals:   07/13/19 0905 07/13/19 0915  BP: 133/78 130/80  Pulse: 76 67  Resp: 16 14  Temp: (!) 36.3 C   SpO2: 99% 100%    Last Pain:  Vitals:   07/13/19 0905  TempSrc:   PainSc: 0-No pain                 Pervis Hocking

## 2019-07-13 NOTE — Discharge Instructions (Signed)
  Post Anesthesia Home Care Instructions  Activity: Get plenty of rest for the remainder of the day. A responsible adult should stay with you for 24 hours following the procedure.  For the next 24 hours, DO NOT: -Drive a car -Paediatric nurse -Drink alcoholic beverages -Take any medication unless instructed by your physician -Make any legal decisions or sign important papers.  Meals: Start with liquid foods such as gelatin or soup. Progress to regular foods as tolerated. Avoid greasy, spicy, heavy foods. If nausea and/or vomiting occur, drink only clear liquids until the nausea and/or vomiting subsides. Call your physician if vomiting continues.  Special Instructions/Symptoms: Your throat may feel dry or sore from the anesthesia or the breathing tube placed in your throat during surgery. If this causes discomfort, gargle with warm salt water. The discomfort should disappear within 24 hours.  If you had a scopolamine patch placed behind your ear for the management of post- operative nausea and/or vomiting:  1. The medication in the patch is effective for 72 hours, after which it should be removed.  Wrap patch in a tissue and discard in the trash. Wash hands thoroughly with soap and water. 2. You may remove the patch earlier than 72 hours if you experience unpleasant side effects which may include dry mouth, dizziness or visual disturbances. 3. Avoid touching the patch. Wash your hands with soap and water after contact with the patch.   NO ADVIL, ALEVE, MOTRIN, IBUPROFEN UNTIL 245 PM

## 2019-07-13 NOTE — Anesthesia Procedure Notes (Signed)
Procedure Name: Intubation Date/Time: 07/13/2019 7:31 AM Performed by: Eben Burow, CRNA Pre-anesthesia Checklist: Patient identified, Emergency Drugs available, Suction available, Patient being monitored and Timeout performed Patient Re-evaluated:Patient Re-evaluated prior to induction Oxygen Delivery Method: Circle system utilized Preoxygenation: Pre-oxygenation with 100% oxygen Induction Type: IV induction, Rapid sequence and Cricoid Pressure applied Laryngoscope Size: Mac and 4 Grade View: Grade I Tube type: Oral Tube size: 7.0 mm Number of attempts: 1 Airway Equipment and Method: Stylet Placement Confirmation: ETT inserted through vocal cords under direct vision,  positive ETCO2 and breath sounds checked- equal and bilateral Secured at: 22 cm Tube secured with: Tape Dental Injury: Teeth and Oropharynx as per pre-operative assessment

## 2019-07-14 LAB — SURGICAL PATHOLOGY

## 2019-10-27 ENCOUNTER — Telehealth: Payer: Self-pay | Admitting: *Deleted

## 2019-10-27 MED ORDER — DIAZEPAM 5 MG PO TABS: ORAL_TABLET | ORAL | 0 refills | Status: DC

## 2019-10-27 NOTE — Telephone Encounter (Signed)
Pt is requesting a sedative prior to her scan.

## 2019-11-03 ENCOUNTER — Other Ambulatory Visit: Payer: BC Managed Care – PPO

## 2019-11-29 ENCOUNTER — Other Ambulatory Visit: Payer: Self-pay | Admitting: *Deleted

## 2019-11-29 DIAGNOSIS — M0579 Rheumatoid arthritis with rheumatoid factor of multiple sites without organ or systems involvement: Secondary | ICD-10-CM

## 2019-11-29 DIAGNOSIS — M3219 Other organ or system involvement in systemic lupus erythematosus: Secondary | ICD-10-CM

## 2019-11-29 DIAGNOSIS — Z1239 Encounter for other screening for malignant neoplasm of breast: Secondary | ICD-10-CM

## 2019-11-29 DIAGNOSIS — Z9189 Other specified personal risk factors, not elsewhere classified: Secondary | ICD-10-CM

## 2019-12-01 NOTE — Progress Notes (Signed)
Message sent to scheduling to schedule pt for appt with Magrinat. MRI has been scheduled for 8/14 per pt.

## 2019-12-10 ENCOUNTER — Other Ambulatory Visit: Payer: BC Managed Care – PPO

## 2019-12-17 ENCOUNTER — Other Ambulatory Visit: Payer: BC Managed Care – PPO

## 2019-12-31 ENCOUNTER — Other Ambulatory Visit: Payer: BC Managed Care – PPO

## 2020-01-05 ENCOUNTER — Inpatient Hospital Stay: Payer: BC Managed Care – PPO | Attending: Oncology | Admitting: Oncology

## 2020-01-05 DIAGNOSIS — Z9189 Other specified personal risk factors, not elsewhere classified: Secondary | ICD-10-CM | POA: Diagnosis not present

## 2020-01-05 DIAGNOSIS — Z1239 Encounter for other screening for malignant neoplasm of breast: Secondary | ICD-10-CM | POA: Diagnosis not present

## 2020-01-05 DIAGNOSIS — M329 Systemic lupus erythematosus, unspecified: Secondary | ICD-10-CM | POA: Diagnosis not present

## 2020-01-05 MED ORDER — ANASTROZOLE 1 MG PO TABS: 1.0000 mg | ORAL_TABLET | Freq: Every day | ORAL | 4 refills | Status: DC

## 2020-01-05 NOTE — Progress Notes (Signed)
[place]  Telephone:([phone] Fax:([phone]) [phone]     ID: SHETARA LAUNER DOB: 14-Jun-1970  MR#: [id number]  KJZ#:[id number]  Patient Care Team: Mateo Flow, MD as PCP - General (Family Medicine) Elouise Munroe, MD as Consulting Physician (Cardiology) Tat, Eustace Quail, DO as Consulting Physician (Neurology) Jalani Rominger, Virgie Dad, MD as Consulting Physician (Oncology) Molli Posey, MD as Consulting Physician (Obstetrics and Gynecology) Simon Rhein, MD as Resident (Neurology) Chauncey Cruel, MD OTHER MD:  I connected with [name] on 01/05/20 at  3:30 PM EDT by telephone visit and verified that I am speaking with the correct person using two identifiers.   I discussed the limitations, risks, security and privacy concerns of performing an evaluation and management service by telemedicine and the availability of in-person appointments. I also discussed with the patient that there may be a patient responsible charge related to this service. The patient expressed understanding and agreed to proceed.   Other persons participating in the visit and their role in the encounter: none  Patients location: home Providers location: [place]: high risk for breast cancer, ATM positive  CURRENT TREATMENT: Intensified screening; to start anastrozole   INTERVAL HISTORY: Deatra was contacted today for follow up of her high risk for breast cancer. She was evaluated in the high risk breast cancer clinic on 04/27/2019.  Since her last visit, she opted to proceed with bilateral salpingo-oophorectomy on 07/13/2019 under Dr. Matthew Saras. Pathology from the procedure ([id number]) was benign.  She was scheduled to undergo breast MRI prior to today's visit, but she cancelled all the appointments that had been made for her.   REVIEW OF SYSTEMS: Keiandra has weaned herself off estrogens, and has been off about a month.  It has  not been as bad as she thought.  She is having essentially no hot flashes and no vaginal dryness problems.  She is very worried about her son who did contact Covid and is still having some symptoms.  Her daughter also has not yet been vaccinated.  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  Aside from these issues a detailed review of systems today was stable   HISTORY OF CURRENT ILLNESS: SABENA WINNER has a family history of cancer in her maternal grandfather. He was diagnosed with metastatic cancer involving lung pancreas and colon.  It is not clear what the primary was.  He was 75 at the time of death..  With this background when Anaika most recently saw Dr. 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  The patient's subsequent history is as detailed below.   PAST MEDICAL HISTORY: Past Medical History:  Diagnosis Date   Allergy    Anemia    Anorexia nervosa with bulimia 2004   Arthritis    ra   Gastroparesis    GERD (gastroesophageal reflux disease)    History of kidney stones    Lupus (HCC)    Menopause    Pain and swelling of right forearm    resolved   POTS (postural orthostatic tachycardia syndrome)    ?pots per 08-02-2018 cardiology note folllow up prn   Skin abnormalities 07/07/2019   right leg above knee pinhole area had fro months scabs over, will bleed if scraped   SLE (systemic lupus erythematosus) ([place]) 08/29/2016  POTS syndrome   PAST SURGICAL HISTORY: Past Surgical History:  Procedure Laterality Date  ABDOMINAL HYSTERECTOMY     2x,2008-2009 partial   CARPAL TUNNEL RELEASE Bilateral    CHOLECYSTECTOMY     CYSTOSCOPY Bilateral 07/13/2019   Procedure: CYSTOSCOPY;  Surgeon: Molli Posey, MD;  Location: [facility];  Service: Gynecology;  Laterality: Bilateral;   CYSTOSCOPY WITH RETROGRADE PYELOGRAM, URETEROSCOPY AND STENT PLACEMENT Left 06/22/2015    Procedure: LEFT URETEROSCOPY AND STONE EXTRACTION ;  Surgeon: Kathie Rhodes, MD;  Location: [facility];  Service: Urology;  Laterality: Left;   KIDNEY STONE SURGERY     5   LAPAROSCOPIC BILATERAL SALPINGO OOPHERECTOMY Bilateral 07/13/2019   Procedure: LAPAROSCOPIC BILATERAL SALPINGO OOPHORECTOMY;  Surgeon: Molli Posey, MD;  Location: [facility];  Service: Gynecology;  Laterality: Bilateral;   OVARIAN CYST SURGERY  2007   LAP    FAMILY HISTORY: Family History  Problem Relation Age of Onset   Arthritis Mother        rheumatoid    Hernia Mother    Hypertension Mother    Heart attack Mother    Diabetes Father    Lupus Sister    Diabetes Maternal Grandmother    Cancer Maternal Grandfather    Pancreatic cancer Maternal Grandfather 22   Lung cancer Maternal Grandfather 68   Colon cancer Maternal Grandfather 65       colorectal   Diabetes Paternal Grandfather    Heart disease Paternal Grandfather    Rheum arthritis Daughter    Hashimoto's thyroiditis Daughter    Heart disease Paternal Grandmother    Diabetes Other    [name] died at age 16 from complications of diabetes.  His parents had no history of cancer.  [name] had 2 sisters and 3 brothers, with no cancer in any of those siblings.  The patient's mother is 25 years old as of December 2020.  Her father had metastatic cancer but the patient does not know what the primary was.  The maternal grandmother did not have cancer and neither did this single maternal aunt.   GYNECOLOGIC HISTORY:  No LMP recorded. Patient has had a hysterectomy. Menarche: 49 years old Age at first live birth: 49 years old [place] P 2 LMP age 61 (with hysterectomy HRT estrogen alone Hysterectomy? Yes, at age 26 BSO? 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  Her husband [name] works for Marketing executive.  There is son  Ysidro Evert, 8 as of December 2020 lives in [place] and works Biomedical engineer towers.  Daughter Estill Bamberg, 21, is a Development worker, community.  The patient has no grandchildren.  She attends a [employer] DIRECTIVES: In the absence of any documents to the contrary the patient's husband is her healthcare power of attorney   HEALTH MAINTENANCE: Social History   Tobacco Use   Smoking status: Never Smoker   Smokeless tobacco: Never Used  Vaping Use   Vaping Use: Never used  Substance Use Topics   Alcohol use: No    Comment: rarely    Drug use: No     Colonoscopy: 2016 (Dr. Oletta Lamas)  PAP: 01/2016, negative  Bone density: Never   Allergies  Allergen Reactions   Zofran [Ondansetron Hcl] Other (See Comments)    Stutters and looses control of her arm and legs   Metoclopramide     "stutters and loses control of arms of legs"    Current Outpatient Medications  Medication Sig Dispense Refill   Ascorbic Acid (  VITAMIN C PO) Take by mouth.     cetirizine (ZYRTEC) 10 MG tablet Take 10 mg by mouth daily as needed for allergies.      Cyanocobalamin (VITAMIN B-12 PO) Take by mouth.     diazepam (VALIUM) 5 MG tablet Take 1-2 tablets 20 minutes prior to scan 2 tablet 0   Esomeprazole Magnesium (NEXIUM PO) Take 40 mg by mouth daily.      estradiol (ESTRACE) 1 MG tablet Take 1 mg by mouth daily.     Fluticasone Propionate (FLONASE ALLERGY RELIEF NA) Place into the nose as needed.      gabapentin (NEURONTIN) 100 MG capsule Take 100 mg by mouth 3 (three) times daily as needed.     hydroxychloroquine (PLAQUENIL) 200 MG tablet TAKE 1 TABLET EACH MORNING AND 1/2 TABLET AT BEDTIME 135 tablet 0   mirabegron ER (MYRBETRIQ) 50 MG TB24 tablet Take 50 mg by mouth daily.     Multiple Vitamins-Minerals (MULTIVITAMIN ADULT PO) Take by mouth daily.     oxyCODONE-acetaminophen (PERCOCET) 5-325 MG tablet Take 1 tablet by mouth every 6 (six) hours as needed for severe pain. 20 tablet 0    polyethylene glycol (MIRALAX / GLYCOLAX) packet Take 17 g by mouth 3 (three) times daily.      promethazine (PHENERGAN) 25 MG tablet Take 25 mg by mouth every 6 (six) hours as needed for nausea or vomiting.     VITAMIN D PO Take by mouth.     No current facility-administered medications for this visit.    OBJECTIVE: Young white woman in no acute distress  There were no vitals filed for this visit.   There is no height or weight on file to calculate BMI.   Wt Readings from Last 3 Encounters:  07/13/19 154 lb 11.2 oz (70.2 kg)  04/27/19 158 lb 4.8 oz (71.8 kg)  08/02/18 149 lb 6.4 oz (67.8 kg)      ECOG FS:1 - Symptomatic but completely ambulatory  Telemedicine visit 01/05/2020  LAB RESULTS:  CMP     Component Value Date/Time   NA 137 03/02/2018 1610   K 4.4 03/02/2018 1610   CL 103 03/02/2018 1610   CO2 28 03/02/2018 1610   GLUCOSE 78 03/02/2018 1610   BUN 12 03/02/2018 1610   CREATININE 0.68 03/02/2018 1610   CALCIUM 9.5 03/02/2018 1610   PROT 7.2 03/02/2018 1610   ALBUMIN 3.9 11/11/2016 1624   AST 80 (H) 03/02/2018 1610   ALT 148 (H) 03/02/2018 1610   ALKPHOS 180 (H) 11/11/2016 1624   BILITOT 0.4 03/02/2018 1610   GFRNONAA 106 03/02/2018 1610   GFRAA 122 03/02/2018 1610    No results found for: TOTALPROTELP, ALBUMINELP, A1GS, A2GS, BETS, BETA2SER, GAMS, MSPIKE, SPEI  No results found for: KPAFRELGTCHN, LAMBDASER, KAPLAMBRATIO  Lab Results  Component Value Date   WBC 8.9 03/02/2018   NEUTROABS 4,931 03/02/2018   HGB 12.0 03/02/2018   HCT 35.0 03/02/2018   MCV 84.7 03/02/2018   PLT 410 (H) 03/02/2018   No results found for: LABCA2  No components found for: LKTGYB638  No results for input(s): INR in the last 168 hours.  No results found for: LABCA2  No results found for: LHT342  No results found for: AJG811  No results found for: XBW620  No results found for: CA2729  No components found for: HGQUANT  No results found for: CEA1 / No results  found for: CEA1   No results found for: AFPTUMOR  No results found  for: CHROMOGRNA  No results found for: HGBA, HGBA2QUANT, HGBFQUANT, HGBSQUAN (Hemoglobinopathy evaluation)   No results found for: LDH  No results found for: IRON, TIBC, IRONPCTSAT (Iron and TIBC)  No results found for: FERRITIN  Urinalysis    Component Value Date/Time   COLORURINE YELLOW 03/02/2016 0132   APPEARANCEUR CLEAR 03/02/2016 0132   LABSPEC 1.002 (L) 03/02/2016 0132   PHURINE 7.0 03/02/2016 0132   GLUCOSEU NEGATIVE 03/02/2016 0132   HGBUR NEGATIVE 03/02/2016 0132   BILIRUBINUR NEGATIVE 03/02/2016 0132   BILIRUBINUR NEG 10/11/2012 1728   KETONESUR NEGATIVE 03/02/2016 0132   PROTEINUR NEGATIVE 03/02/2016 0132   UROBILINOGEN 0.2 10/12/2012 1417   NITRITE NEGATIVE 03/02/2016 0132   LEUKOCYTESUR NEGATIVE 03/02/2016 0132    STUDIES: No results found.   ELIGIBLE FOR AVAILABLE RESEARCH PROTOCOL: no  ASSESSMENT: 49 y.o. 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  She is now menopausal with minimal symptomatology.  She is ready to consider anastrozole..    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  She does not feel quite ready to start anastrozole but requested that I put in the prescription which I was glad to do and she was started after the September holiday  She was very nervous about her MRI and having to take her lorazepam.  She will reschedule that in the near future.  I am going to check in on her by a virtual visit after a couple of months.  If  everything is going well we will start yearly follow-up.  Chauncey Cruel, MD   01/05/2020 3:39 PM Medical Oncology and Hematology [facility] [place],  [address] Tel. [phone]    Fax. [phone]   I, Wilburn Mylar, am acting as scribe for Dr. Virgie Dad. Deajah Erkkila.  I, Lurline Del MD, have reviewed the above documentation for accuracy and completeness, and I agree with the above.    *Total Encounter Time as defined by the Centers for Medicare and Medicaid Services includes, in addition to the face-to-face time of a patient visit (documented in the note above) non-face-to-face time: obtaining and reviewing outside history, ordering and reviewing medications, tests or procedures, care coordination (communications with other health care professionals or caregivers) and documentation in the medical record.

## 2020-01-10 ENCOUNTER — Telehealth: Payer: Self-pay | Admitting: Oncology

## 2020-01-10 NOTE — Telephone Encounter (Signed)
Scheduled appt per 8/19 los. Left voicemail with appt date, time, and details of it being a virtual visit.

## 2020-02-13 ENCOUNTER — Encounter: Payer: Self-pay | Admitting: Pulmonary Disease

## 2020-02-13 ENCOUNTER — Ambulatory Visit: Payer: BC Managed Care – PPO | Admitting: Pulmonary Disease

## 2020-02-13 ENCOUNTER — Other Ambulatory Visit: Payer: Self-pay

## 2020-02-13 VITALS — BP 116/70 | HR 62 | Temp 97.7°F | Ht 60.0 in | Wt 164.2 lb

## 2020-02-13 DIAGNOSIS — R058 Other specified cough: Secondary | ICD-10-CM

## 2020-02-13 DIAGNOSIS — K219 Gastro-esophageal reflux disease without esophagitis: Secondary | ICD-10-CM

## 2020-02-13 DIAGNOSIS — R05 Cough: Secondary | ICD-10-CM

## 2020-02-13 DIAGNOSIS — J4550 Severe persistent asthma, uncomplicated: Secondary | ICD-10-CM | POA: Diagnosis not present

## 2020-02-13 DIAGNOSIS — R059 Cough, unspecified: Secondary | ICD-10-CM

## 2020-02-13 NOTE — Progress Notes (Signed)
[facility]  Chief Complaint  Patient presents with  . Consult    sore throat , cough, went to hospital august 27th.[place] no sob but still has cough.pt has hx of brochitis    Constitutional:  BP 116/70 (BP Location: Left Arm, Cuff Size: Normal)   Pulse 62   Temp 97.7 F (36.5 C)   Ht 5' (1.524 m)   Wt 164 lb 3.2 oz (74.5 kg)   SpO2 96%   BMI 32.07 kg/m   Past Medical History:  Allergies, Anemia, Anorexia nervosa with bulimia, RA, Gastroparesis, GERD, Nephrolithiasis, Lupus, POTS  Past Surgical History:  Her  has a past surgical history that includes Cholecystectomy; Carpal tunnel release (Bilateral); Kidney stone surgery; Ovarian cyst surgery (2007); Cystoscopy with retrograde pyelogram, ureteroscopy and stent placement (Left, 06/22/2015); Abdominal hysterectomy; Laparoscopic bilateral salpingo oophorectomy (Bilateral, 07/13/2019); and Cystoscopy (Bilateral, 07/13/2019).  Brief Summary:  Debbie [name] is a 49 y.o. female with a cough.      Subjective:  She gets frequent episodes of bronchitis when she has a respiratory infection.  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  Several children tested positive for COVID.  She developed a cough around August 22.  She was seen in ER in [place] at the end of August.  COVID and Influenza tests negative.  She was told her chest xray still was concerning that she could have COVID.  She has been treated several times with steroids and antibiotics.  She was started on breztri and albuterol and these seem to help some.  She has never been told that she has allergies or asthma, but does have post nasal drip.  She also has reflux.  She has been taking pepcid and nexium.  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  Chest xray from 01/14/20 showed linear density at Lt lung base.  Physical  Exam:   Appearance - well kempt   ENMT - no sinus tenderness, clear nasal drainage, no oral exudate, no LAN, Mallampati 2 airway, no stridor, erythema of posterior pharynx  Respiratory - equal breath sounds bilaterally, no wheezing or rales  CV - s1s2 regular rate and rhythm, no murmurs  Ext - no clubbing, no edema  Skin - no rashes  Psych - normal mood and affect   Pulmonary testing:   Serology 07/16/17 >> ANA 1:640 homogenous pattern  Chest Imaging:   CT chest 04/23/16 >> trace effusions, basilar ATX  Cardiac Tests:   Echo 07/21/18 >> EF 60 to 65%  Social History:  She  reports that she has never smoked. She has never used smokeless tobacco. She reports that she does not drink alcohol and does not use drugs.  Family History:  Her family history includes Arthritis in her mother; Cancer in her maternal grandfather; Colon cancer (age of onset: 82) in her maternal grandfather; Diabetes in her father, maternal grandmother, paternal grandfather, and another family member; Hashimoto's thyroiditis in her daughter; Heart attack in her mother; Heart disease in her paternal grandfather and paternal grandmother; Hernia in her mother; Hypertension in her mother; Lung cancer (age of onset: 27) in her maternal grandfather; Lupus in her sister; Pancreatic cancer (age of onset: 74) in her maternal grandfather; Rheum arthritis in her daughter.    Discussion:  She likely had a viral respiratory infection during August 2021.  This likely triggered asthmatic  bronchitis.  She also has rhinitis with post nasal drip and acid reflux as contributors to her cough.  Finally she likely has cyclic cough in which the cough begets the cough due to chronic throat irritation.  Assessment/Plan:   Chronic cough. - sip water with the urge to cough - sugarless candy to keep mouth moist - salt water gargles once or twice per day - 1 teaspoon of local honey daily - promethazine prn qhs  - I have advised her to  delay her 2nd COVID vaccine for now and will re-assess timing at her next visit - I have advised her not to return to work until she can be re-assessed at her next visit in 2 weeks; she will have FMLA forms sent  Upper airway cough with post nasal drip. - Ayr, Flonase, Zyrtec  Asthmatic bronchitis. - continue sample of Breztri and albuterol from her PCP - she will need PFT at some point, but defer until her cough is improved  Acid reflux. 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 - followed by Dr. Amil Amen with rheumatology - if her cough persists, then she will need high resolution CT chest   Time Spent Involved in Patient Care on Day of Examination:  63 minutes  Follow up:  Patient Instructions  Try using Ayr nasal spray daily Flonase 1 spray in each nostril daily Sip water when you have the urge to cough Uses sugarless candy to keep your mouth moist Salt water gargle once or twice per day Allow your voice to rest as able 1 teaspoon of local honey daily Breztri two puffs twice per day, and rinse your mouth after each use Continue pepcid and nexium Follow up in 2 weeks with Dr. Halford Chessman or Nurse Practitioner   Medication List:   Allergies as of 02/13/2020      Reactions   Zofran Alvis Lemmings Hcl] Other (See Comments)   Stutters and looses control of her arm and legs   Metoclopramide    "stutters and loses control of arms of legs"      Medication List       Accurate as of February 13, 2020  5:26 PM. If you have any questions, ask your nurse or doctor.        STOP taking these medications   diazepam 5 MG tablet Commonly known as: VALIUM Stopped by: Chesley Mires, MD   Myrbetriq 50 MG Tb24 tablet Generic drug: mirabegron ER Stopped by: Chesley Mires, MD     TAKE these medications   anastrozole 1 MG tablet Commonly known as: ARIMIDEX Take 1 tablet (1 mg total) by mouth daily.   cetirizine 10 MG  tablet Commonly known as: ZYRTEC Take 10 mg by mouth daily as needed for allergies.   FLONASE ALLERGY RELIEF NA Place into the nose as needed.   gabapentin 100 MG capsule Commonly known as: NEURONTIN Take 100 mg by mouth 3 (three) times daily as needed.   hydroxychloroquine 200 MG tablet Commonly known as: PLAQUENIL TAKE 1 TABLET EACH MORNING AND 1/2 TABLET AT BEDTIME   MULTIVITAMIN ADULT PO Take by mouth daily.   NEXIUM PO Take 40 mg by mouth daily.   polyethylene glycol 17 g packet Commonly known as: MIRALAX / GLYCOLAX Take 17 g by mouth 3 (three) times daily.   promethazine 25 MG tablet Commonly known as: PHENERGAN Take 25 mg by mouth every 6 (six) hours as needed for nausea or vomiting.  VITAMIN B-12 PO Take by mouth.   VITAMIN C PO Take by mouth.   VITAMIN D PO Take by mouth.       Signature:  Chesley Mires, MD [facility] Pager - [phone] 02/13/2020, 5:26 PM

## 2020-02-13 NOTE — Patient Instructions (Signed)
Try using Ayr nasal spray daily Flonase 1 spray in each nostril daily Sip water when you have the urge to cough Uses sugarless candy to keep your mouth moist Salt water gargle once or twice per day Allow your voice to rest as able 1 teaspoon of local honey daily Breztri two puffs twice per day, and rinse your mouth after each use Continue pepcid and nexium Follow up in 2 weeks with Dr. Halford Chessman or Nurse Practitioner

## 2020-02-14 ENCOUNTER — Telehealth: Payer: Self-pay | Admitting: Pulmonary Disease

## 2020-02-14 NOTE — Telephone Encounter (Signed)
Patient dropped off disability forms. Will work on this and contact pt regarding fees. -pr

## 2020-02-14 NOTE — Telephone Encounter (Signed)
Called and spoke with pt. Pt stated that she was supposed to have returned back to work on 10/4 but she is requesting to have a letter sent to her via mychart for her to be out of work until 02/27/20.   Dr. Halford Chessman, please advise if you are okay with this being written for pt.

## 2020-02-15 ENCOUNTER — Encounter: Payer: Self-pay | Admitting: Pulmonary Disease

## 2020-02-15 NOTE — Telephone Encounter (Signed)
Called and spoke to patient, and made her aware of the fee. Scanned and email forms to her to sign to release information. Emailed to [email or web address]. Will fill out short disability and FMLA forms and give to Dr. Halford Chessman when he returns to the office on 02/20/2020. -pr

## 2020-02-15 NOTE — Telephone Encounter (Signed)
Letter sent through [place].  Please confirm with Ms. Quentin Ore that she received the letter.

## 2020-02-15 NOTE — Telephone Encounter (Signed)
Forms received and called patient to advise of fee. -pr

## 2020-02-15 NOTE — Telephone Encounter (Signed)
Patient did receive letter.   Patient asking about payment for disability and fmla paperwork. She states on 02/13/20 she dropped off FMLA disability forms and was wondering about payment but she has both disability and fmla forms that were given to the office.   Patrice please advise.

## 2020-02-16 NOTE — Telephone Encounter (Signed)
Rec'd signed release of records forms, awaiting on Dr. Juanetta Gosling return to have FMLA and STD forms signed -pr

## 2020-02-21 NOTE — Telephone Encounter (Signed)
Rec'd completed forms back from Dr. Halford Chessman. Sent email to Kathlee Nations to drop charge for [employer]. Once I have confirmation that this has been done, I will call patient to collect fee. -pr

## 2020-02-22 DIAGNOSIS — J4550 Severe persistent asthma, uncomplicated: Secondary | ICD-10-CM

## 2020-02-23 NOTE — Telephone Encounter (Signed)
Called patient collected payment info over the phone. Faxed disability paperwork to [employer] Fax # [phone], emailed FMLA forms to patient as there is no fax info to send FMLA paperwork. -pr

## 2020-02-24 ENCOUNTER — Institutional Professional Consult (permissible substitution): Payer: BC Managed Care – PPO | Admitting: Internal Medicine

## 2020-02-27 ENCOUNTER — Ambulatory Visit: Payer: BC Managed Care – PPO | Admitting: Pulmonary Disease

## 2020-02-27 ENCOUNTER — Encounter: Payer: Self-pay | Admitting: Pulmonary Disease

## 2020-02-27 ENCOUNTER — Ambulatory Visit (INDEPENDENT_AMBULATORY_CARE_PROVIDER_SITE_OTHER): Payer: BC Managed Care – PPO

## 2020-02-27 ENCOUNTER — Other Ambulatory Visit: Payer: Self-pay

## 2020-02-27 VITALS — BP 120/70 | HR 86 | Temp 98.0°F | Ht 61.0 in | Wt 164.0 lb

## 2020-02-27 DIAGNOSIS — I498 Other specified cardiac arrhythmias: Secondary | ICD-10-CM

## 2020-02-27 DIAGNOSIS — M329 Systemic lupus erythematosus, unspecified: Secondary | ICD-10-CM

## 2020-02-27 DIAGNOSIS — M0579 Rheumatoid arthritis with rheumatoid factor of multiple sites without organ or systems involvement: Secondary | ICD-10-CM | POA: Diagnosis not present

## 2020-02-27 DIAGNOSIS — K219 Gastro-esophageal reflux disease without esophagitis: Secondary | ICD-10-CM

## 2020-02-27 DIAGNOSIS — G90A Postural orthostatic tachycardia syndrome (POTS): Secondary | ICD-10-CM | POA: Insufficient documentation

## 2020-02-27 DIAGNOSIS — R059 Cough, unspecified: Secondary | ICD-10-CM | POA: Diagnosis not present

## 2020-02-27 DIAGNOSIS — Z0000 Encounter for general adult medical examination without abnormal findings: Secondary | ICD-10-CM

## 2020-02-27 NOTE — Assessment & Plan Note (Signed)
Cough is likely multifactorial given recent suspected upper respiratory infection.  Patient with multiple negative PCR test.  Patient continues to have ongoing dry cough I suspect is exacerbated by upper airway cough syndrome.  We do not have recent chest imaging on file.  We have seen documented radiology reports from 01/27/2020 with a clear chest x-ray.  Patient is concerned that she may need a CT of her chest.  She has reviewed this with rheumatology.  Plan: Will obtain chest x-ray today We will obtain pulmonary function testing, if PFT shows restriction or diffusion defect may need to consider CT imaging given patient's known connective tissue disorders Continue home cough instructions as previously explained on 02/13/2020 Continue to work on cough suppression Okay to stop [employer]

## 2020-02-27 NOTE — Assessment & Plan Note (Signed)
Plan: Okay to receive second COVID-19 vaccination from a pulmonary standpoint Please obtain your seasonal flu vaccine in fall/2021

## 2020-02-27 NOTE — Progress Notes (Signed)
@Patient  ID: Debbie [name], female    DOB: 05-Mar-1971, 49 y.o.   MRN: [id number]  Chief Complaint  Patient presents with  . Follow-up    Pt states her cough is just about gone. Pt still has a sore throat which she says some days are worse than others. Pt does have some chest pain still but is unsure if it is from reflux.    Referring provider: Mateo Flow, MD  HPI:  49 year old female never smoker found office for cough  PMH: History of chronic headaches, rheumatoid arthritis, anxiety, GERD, gastroparesis, systemic lupus erythematosus Smoker/ Smoking History: Never smoker Maintenance:   Pt of: Dr. Halford Chessman  02/27/2020  - Visit   49 year old female never smoker initially referred to our office on 02/13/2020 for chronic cough.  She was evaluated by Dr. Halford Chessman.  Patient was encouraged to have a 2-week follow-up with our office.  At last office visit with Dr. Halford Chessman on 02/13/2020 it was reported that she was exposed to COVID-19 in August/2021.  She developed a cough on 01/08/2020.  Covid and flu test have been negative.  She was told that her chest x-ray was concerning so she still could have Covid.  She was treated with several rounds of antibiotics.  She was also started on Breo as treated.  She has a history of connective tissue disease mix of lupus as well as rheumatoid arthritis.  Followed by Dr. Amil Amen.  Chart review: 01/20/2020-chest x-ray-minimal linear strandy opacities at left lung base, atelectasis, infiltrate or scarring  01/27/2020-chest x-ray-negative chest x-ray  Patient was treated with Rocephin injection as well as dexamethasone injection on 01/20/2020.  Patient was treated with Levaquin on 01/27/2020. Also treated with zpak at some point.    Patient reporting to our office today that her cough has improved over the last 2 weeks.  She continues to have an occasional dry cough.  But with current recommendations by Dr. Halford Chessman as listed below she is seeing improvement:  Chronic  cough. - sip water with the urge to cough - sugarless candy to keep mouth moist - salt water gargles once or twice per day - 1 teaspoon of local honey daily - promethazine prn qhs  - I have advised her to delay her 2nd COVID vaccine for now and will re-assess timing at her next visit  Patient reports that she was seen by rheumatology and they were questioning whether or not patient would need a CT scan of her chest.  We will discuss this today.  Patient has had 2 - PCR Covid test as well as 1 - rapid Covid test.  She is also had a negative flu test.  She continues to slowly recover from a suspected upper respiratory infection.  Patient admits that she has a history of a significant pneumonia last year.  This is managed by her PCP.  She does not believe that she had CT imaging of this.  She reports that she was delayed getting a chest x-ray during this pneumonia due to the ongoing Covid pandemic.  She reports that she typically gets an annual episode of asthmatic bronchitis or pneumonia.  We will discuss this today.  Patient is still taking Breztri.  She is unsure if this is helping.  We will discuss this today.  She has not obtain pulmonary function testing.  She denies any persistent or worsened myalgias or suspected flares of her rheumatoid arthritis or lupus.  She is due for her second Covid vaccination.  She  is unsure if she should receive this today.  She is also due for seasonal flu vaccine.  She plans to return to work tomorrow 02/28/2020.  She also has occasional reflux symptoms.  She reports that she is currently taking Pepcid twice a day as well as Nexium at lunch.  She is unsure if she is having breakthrough reflux with this remedy.  She reports that typically she does not have standard reflux symptoms and it presents more with hoarseness.  Which she does currently have.  She is on a plethora of daily vitamins.  She is unsure which ones she needs to remain on.  She is were started by  urgent care providers as well as rheumatology.  We will discuss this today.  Patient has stopped taking her potassium over the last 3 to 4 weeks which was managed by cardiology due to her history of suspected POTS.  She was last seen by cardiology in March/2020.  She has not followed up with them to notify that she is stopped taking her potassium or that she started to have more flares of her symptoms.  We will discuss this today.  Last office note by cardiology is listed below:  08/02/2018 - Cards - POTS - Dr. Margaretann Loveless  ASSESSMENT:    1. Dizziness   2. Gastroparesis   3. Rheumatoid arthritis involving multiple sites with positive rheumatoid factor ([name])   4. History of anxiety    PLAN:    She is doing much better with the conservative measures that we recommended at our last visit including compression stockings, liberalizing salt, and decreasing caffeine as well as increasing fluid intake.  She is experiencing some life stressors with her dog who is diabetic and has been somewhat ill, but otherwise has no specific cardiopulmonary concerns.  Her testing is all reassuring.  We have discussed that she may have POTS, but no further testing is required at this time. I have encouraged the patient to follow up as needed if symptoms worsen.     Tests:   Pulmonary testing:   Serology 07/16/17 >> ANA 1:640 homogenous pattern  Chest Imaging:   CT chest 04/23/16 >> trace effusions, basilar ATX  Cardiac Tests:   Echo 07/21/18 >> EF 60 to 65%    FENO:  No results found for: NITRICOXIDE  PFT: No flowsheet data found.  WALK:  No flowsheet data found.  Imaging: No results found.  Lab Results:  CBC    Component Value Date/Time   WBC 8.9 03/02/2018 1610   RBC 4.13 03/02/2018 1610   HGB 12.0 03/02/2018 1610   HCT 35.0 03/02/2018 1610   PLT 410 (H) 03/02/2018 1610   MCV 84.7 03/02/2018 1610   MCV 90.0 10/11/2012 1702   MCH 29.1 03/02/2018 1610   MCHC 34.3 03/02/2018 1610   RDW  12.6 03/02/2018 1610   LYMPHSABS 3,231 03/02/2018 1610   MONOABS 469 11/11/2016 1624   EOSABS 80 03/02/2018 1610   BASOSABS 53 03/02/2018 1610    BMET    Component Value Date/Time   NA 137 03/02/2018 1610   K 4.4 03/02/2018 1610   CL 103 03/02/2018 1610   CO2 28 03/02/2018 1610   GLUCOSE 78 03/02/2018 1610   BUN 12 03/02/2018 1610   CREATININE 0.68 03/02/2018 1610   CALCIUM 9.5 03/02/2018 1610   GFRNONAA 106 03/02/2018 1610   GFRAA 122 03/02/2018 1610    BNP No results found for: BNP  ProBNP No results found for: PROBNP  Specialty  Problems      Pulmonary Problems   Cough      Allergies  Allergen Reactions  . Zofran [Ondansetron Hcl] Other (See Comments)    Stutters and looses control of her arm and legs  . Metoclopramide     "stutters and loses control of arms of legs"    Immunization History  Administered Date(s) Administered  . PFIZER SARS-COV-2 Vaccination 12/24/2019    Past Medical History:  Diagnosis Date  . Allergy   . Anemia   . Anorexia nervosa with bulimia 2004  . Arthritis    ra  . Gastroparesis   . GERD (gastroesophageal reflux disease)   . History of kidney stones   . Lupus ([place])   . Menopause   . Pain and swelling of right forearm    resolved  . POTS (postural orthostatic tachycardia syndrome)    ?pots per 08-02-2018 cardiology note folllow up prn  . Skin abnormalities 07/07/2019   right leg above knee pinhole area had fro months scabs over, will bleed if scraped  . SLE (systemic lupus erythematosus) ([place]) 08/29/2016    Tobacco History: Social History   Tobacco Use  Smoking Status Never Smoker  Smokeless Tobacco Never Used   Counseling given: Not Answered   Continue to not smoke  Outpatient Encounter Medications as of 02/27/2020  Medication Sig  . albuterol (VENTOLIN HFA) 108 (90 Base) MCG/ACT inhaler Inhale into the lungs.  . Ascorbic Acid (VITAMIN C PO) Take by mouth.  . Budeson-Glycopyrrol-Formoterol (BREZTRI  AEROSPHERE) 160-9-4.8 MCG/ACT AERO Inhale 2 puffs into the lungs in the morning and at bedtime.  . cetirizine (ZYRTEC) 10 MG tablet Take 10 mg by mouth daily as needed for allergies.   . Cyanocobalamin (VITAMIN B-12 PO) Take by mouth.  . Esomeprazole Magnesium (NEXIUM PO) Take 40 mg by mouth daily.   . Fluticasone Propionate (FLONASE ALLERGY RELIEF NA) Place into the nose as needed.   . gabapentin (NEURONTIN) 100 MG capsule Take 100 mg by mouth 3 (three) times daily as needed.  . hydroxychloroquine (PLAQUENIL) 200 MG tablet TAKE 1 TABLET EACH MORNING AND 1/2 TABLET AT BEDTIME  . Multiple Vitamins-Minerals (MULTIVITAMIN ADULT PO) Take by mouth daily.  . Multiple Vitamins-Minerals (ZINC PO) Take 1 tablet by mouth in the morning and at bedtime.  . Oral Electrolytes (THERMOTABS PO) Take 1 tablet by mouth daily.  . polyethylene glycol (MIRALAX / GLYCOLAX) packet Take 17 g by mouth 3 (three) times daily.   . promethazine (PHENERGAN) 25 MG tablet Take 25 mg by mouth every 6 (six) hours as needed for nausea or vomiting.  Marland Kitchen VITAMIN D PO Take by mouth.  Marland Kitchen anastrozole (ARIMIDEX) 1 MG tablet Take 1 tablet (1 mg total) by mouth daily. (Patient not taking: Reported on 02/13/2020)   No facility-administered encounter medications on file as of 02/27/2020.     Review of Systems  Review of Systems  Constitutional: Negative for activity change, fatigue and fever.  HENT: Negative for sinus pressure, sinus pain and sore throat.   Respiratory: Positive for cough (when wearing mask, talking ). Negative for shortness of breath and wheezing.   Cardiovascular: Negative for chest pain and palpitations.  Gastrointestinal: Positive for nausea.  Musculoskeletal: Positive for myalgias ("fingers tender"). Negative for arthralgias.       Followed by Dr. Amil Amen - Rheum  Neurological: Negative for dizziness.  Psychiatric/Behavioral: Negative for sleep disturbance. The patient is not nervous/anxious.      Physical  Exam  BP 120/70 (BP  Location: Left Arm, Cuff Size: Normal)   Pulse 86   Temp 98 F (36.7 C) (Oral)   Ht 5\' 1"  (1.549 m)   Wt 164 lb (74.4 kg)   SpO2 98%   BMI 30.99 kg/m   Wt Readings from Last 5 Encounters:  02/27/20 164 lb (74.4 kg)  02/13/20 164 lb 3.2 oz (74.5 kg)  07/13/19 154 lb 11.2 oz (70.2 kg)  04/27/19 158 lb 4.8 oz (71.8 kg)  08/02/18 149 lb 6.4 oz (67.8 kg)    BMI Readings from Last 5 Encounters:  02/27/20 30.99 kg/m  02/13/20 32.07 kg/m  07/13/19 29.23 kg/m  04/27/19 29.91 kg/m  08/02/18 28.23 kg/m     Physical Exam Vitals and nursing note reviewed.  Constitutional:      General: She is not in acute distress.    Appearance: Normal appearance.  HENT:     Head: Normocephalic and atraumatic.     Right Ear: External ear normal.     Left Ear: External ear normal.     Nose:     Comments: Deferred due to masking requirement    Mouth/Throat:     Comments: Deferred due to masking requirement Eyes:     Pupils: Pupils are equal, round, and reactive to light.  Cardiovascular:     Rate and Rhythm: Normal rate and regular rhythm. Occasional extrasystoles are present.    Pulses: Normal pulses.     Heart sounds: Normal heart sounds. No murmur heard.   Pulmonary:     Effort: Pulmonary effort is normal. No respiratory distress.     Breath sounds: Normal breath sounds. No decreased air movement. No decreased breath sounds, wheezing or rales.     Comments: Multiple coughing episodes during exam Musculoskeletal:     Cervical back: Normal range of motion.  Skin:    General: Skin is warm and dry.     Capillary Refill: Capillary refill takes less than 2 seconds.  Neurological:     General: No focal deficit present.     Mental Status: She is alert and oriented to person, place, and time. Mental status is at baseline.     Gait: Gait normal.  Psychiatric:        Mood and Affect: Mood normal.        Behavior: Behavior normal.        Thought Content: Thought  content normal.        Judgment: Judgment normal.       Assessment & Plan:   Discussion: Would recommend the patient go ahead and stop taking the zinc vitamin that she was prescribed by an urgent care provider.  Would recommend that she review her other vitamins with primary care as well as rheumatology as they previously manage.  Explained to the patient that she needs to contact cardiology since she stopped taking her potassium and she is feeling that the dizziness and pots syndrome that she saw them for in March/2020 has worsened.  She has not yet scheduled follow-up.  She does feel that she has laryngeal reflux.  Continue taking Nexium and Pepcid at this time.  If symptoms continue to persist or worsen she needs to schedule a follow-up with [facility] gastroenterology who she is established with.  She needs to continue follow-up with rheumatology as previously discussed.  We will obtain pulmonary function testing to further evaluate chronic cough.  Suspect the cough is driven more by recent upper respiratory infection.  Patient does have known connective tissue disorders.  If she has known restriction or diffusion defect on pulmonary function testing will need to consider a CT of her chest to evaluate for interstitial lung disease.  There also could be a component of asthmatic bronchitis given the fact that patient typically every fall/winter has an episode of bronchitis or pneumonia requiring antibiotic coverage.  We will review this after her breathing test.  Believe at this point in time it is reasonable for her to trial coming off of her breztri until we have obtain pulmonary function testing as she is not sure if this is provided clinical benefit.  Patient is wondering if it is okay for her to return back to work.  We have signed forms today that she can proceed back to work without any restrictions.  I have encouraged her to obtain her second COVID-19 vaccination.  I do not see an indication for why  she could not obtain this at this point in time.  She would like a note for work for if symptoms develop after COVID-19 second dose vaccination.  I have explained to the patient that she can contact her office if and when those symptoms do occur and we can obtain documentation that she received the COVID-19 vaccination as she is stated and we can write a work note for her.  We will not sign a form in advance of her receiving the vaccination for suspected symptoms.  Patient is agreeable to this.  She knows to contact our office with any worsening respiratory symptoms, cough, questions regarding COVID-19.  I have emphasized to the patient that important that she keeps consistency with her primary care as well as her specialist team.  POTS (postural orthostatic tachycardia syndrome) Plan: Patient needs to notify cardiology that she is stopped taking her potassium Patient needs to schedule follow-up with cardiology as she is having worsening symptoms since stopping her potassium that they manage   Gastroesophageal reflux disease Plan: Start taking Nexium in the morning as instructed today Continue Pepcid in the afternoon If you continue to have breakthrough reflux symptoms you need to schedule follow-up with your gastroenterologist at [facility]  Rheumatoid arthritis involving multiple sites with positive rheumatoid factor ([place]) Plan: Continue follow-up with Dr. Amil Amen  Cough Cough is likely multifactorial given recent suspected upper respiratory infection.  Patient with multiple negative PCR test.  Patient continues to have ongoing dry cough I suspect is exacerbated by upper airway cough syndrome.  We do not have recent chest imaging on file.  We have seen documented radiology reports from 01/27/2020 with a clear chest x-ray.  Patient is concerned that she may need a CT of her chest.  She has reviewed this with rheumatology.  Plan: Will obtain chest x-ray today We will obtain pulmonary function  testing, if PFT shows restriction or diffusion defect may need to consider CT imaging given patient's known connective tissue disorders Continue home cough instructions as previously explained on 02/13/2020 Continue to work on cough suppression Okay to stop [employer] Plan: Okay to receive second COVID-19 vaccination from a pulmonary standpoint Please obtain your seasonal flu vaccine in fall/2021  SLE (systemic lupus erythematosus) ([place]) Plan: Continue follow-up with rheumatology    Return in about 6 weeks (around 04/09/2020), or if symptoms worsen or fail to improve, for Follow up with Dr. Halford Chessman, Follow up for FULL PFT - 60 min.   Lauraine Rinne, NP 02/27/2020   This appointment required 45 minutes of patient care (this includes precharting, chart review, review  of results, face-to-face care, etc.).

## 2020-02-27 NOTE — Patient Instructions (Addendum)
You were seen today by Lauraine Rinne, NP  for:   1. Cough  - Pulmonary function test; Future - DG Chest 2 View; Future  Okay to stop Breztri at this time   Chest x-ray today  Continue current recommendations by Dr. Halford Chessman:  - sip water with the urge to cough - sugarless candy to keep mouth moist - salt water gargles once or twice per day - 1 teaspoon of local honey daily - promethazine prn qhs   Nexium  >>>Please take 1 tablet daily 15 minutes to 30 minutes before your first meal of the day as well as before your other medications >>>Try to take at the same time each day >>>take this medication daily  Okay to continue Pepcid in the evening  Please limit forceful coughing to help prevent inflammation of your vocal cords  2. Rheumatoid arthritis involving multiple sites with positive rheumatoid factor (H[facility]) 3. Systemic lupus erythematosus, unspecified SLE type, unspecified organ involvement status ([place])  We will order pulmonary function testing to evaluate your cough as well as your breathing given the fact that you have a history of connective tissue disorders.  Keep follow-up with rheumatology  Based off your breathing test we will decide whether not we will proceed forward with CT imaging of your chest  4. POTS (postural orthostatic tachycardia syndrome)  Please notify cardiology that you have stopped taking potassium as they have previously instructed for management of your pots  Since you are currently symptomatic you need to seek evaluation with cardiology and schedule a follow-up  5. Healthcare maintenance  We recommend you proceed forward with obtaining your second Covid vaccine  Please also obtain your seasonal flu vaccine 2 to 4 weeks after obtaining your Covid 19 vaccination   We recommend today:  Orders Placed This Encounter  Procedures  . DG Chest 2 View    Standing Status:   Future    Standing Expiration Date:   06/29/2020    Order Specific Question:    Reason for Exam (SYMPTOM  OR DIAGNOSIS REQUIRED)    Answer:   cough    Order Specific Question:   Preferred imaging location?    Answer:   Internal    Order Specific Question:   Radiology Contrast Protocol - do NOT remove file path    Answer:   [email or web address]  . Pulmonary function test    Standing Status:   Future    Standing Expiration Date:   02/26/2021    Order Specific Question:   Where should this test be performed?    Answer:   [facility]    Order Specific Question:   Full PFT: includes the following: basic spirometry, spirometry pre & post bronchodilator, diffusion capacity (DLCO), lung volumes    Answer:   Full PFT   Orders Placed This Encounter  Procedures  . DG Chest 2 View  . Pulmonary function test   No orders of the defined types were placed in this encounter.   Follow Up:    Return in about 6 weeks (around 04/09/2020), or if symptoms worsen or fail to improve, for Follow up with Dr. Halford Chessman, Follow up for FULL PFT - 60 min.      Notification of test results are managed in the following manner: If there are  any recommendations or changes to the  plan of care discussed in office today,  we will contact you and let you know what they are. If you do not hear from [place], then  your results are normal and you can view them through your  MyChart account , or a letter will be sent to you. Thank you again for trusting [place] with your care  - Thank you, [facility]    It is flu season:   >>> Best ways to protect herself from the flu: Receive the yearly flu vaccine, practice good hand hygiene washing with soap and also using hand sanitizer when available, eat a nutritious meals, get adequate rest, hydrate appropriately       Please contact the office if your symptoms worsen or you have concerns that you are not improving.   Thank you for choosing [facility] for your healthcare, and for allowing [place]  to partner with you on your healthcare journey. I am thankful to be able to provide care to you today.   Wyn Quaker FNP-C

## 2020-02-27 NOTE — Assessment & Plan Note (Signed)
Plan: Continue follow-up with rheumatology 

## 2020-02-27 NOTE — Assessment & Plan Note (Signed)
Plan: Continue follow-up with Dr. Amil Amen

## 2020-02-27 NOTE — Assessment & Plan Note (Signed)
Plan: Start taking Nexium in the morning as instructed today Continue Pepcid in the afternoon If you continue to have breakthrough reflux symptoms you need to schedule follow-up with your gastroenterologist at [facility]

## 2020-02-27 NOTE — Assessment & Plan Note (Signed)
Plan: Patient needs to notify cardiology that she is stopped taking her potassium Patient needs to schedule follow-up with cardiology as she is having worsening symptoms since stopping her potassium that they manage

## 2020-02-28 NOTE — Progress Notes (Signed)
Reviewed and agree with assessment/plan.   Chesley Mires, MD [facility] 02/28/2020, 9:01 AM Pager:  [phone]

## 2020-02-29 ENCOUNTER — Telehealth: Payer: Self-pay | Admitting: Pulmonary Disease

## 2020-02-29 ENCOUNTER — Encounter: Payer: Self-pay | Admitting: *Deleted

## 2020-02-29 NOTE — Telephone Encounter (Signed)
See 02/29/2020 phone note

## 2020-02-29 NOTE — Telephone Encounter (Signed)
Pt sent copy of her vaccine card via mychart  I generated a letter for her based on Brian's response below  Nothing further needed

## 2020-02-29 NOTE — Telephone Encounter (Signed)
02/29/2020  We can provide a documented letter once we have received her vaccination card through [place].  I explained this to the patient when we discussed this at her last appointment.  Okay to provide note documenting that she is received her Covid vaccine she is currently having the symptoms.    We cannot write her out of work with a return to work date based off the symptoms though.  That is between her and her employer.  We can simply provide the documentation that she is received the vaccination once we have the proof of vaccination, and that she currently has these reported symptoms.  Wyn Quaker, FNP

## 2020-02-29 NOTE — Telephone Encounter (Signed)
Called and spoke with patient who states that she got her Covid vaccine at [facility] in [place] on [address] and is now having side effects, headache, fatigue, diarrhea dry cough. Not as tired today and diarrhea is a little better. She would like a work note for 10/12, 10/13 - return to work on 10/14. Also asked patient to send [place] a picture of her Vaccine card through [place].  Aaron Edelman please advise

## 2020-02-29 NOTE — Telephone Encounter (Signed)
Spoke with the pt  She is stating that the letter we did needs to be changed  She needs letter to state that she is having "side effects" related to the vaccine but not list what they are  Letter done

## 2020-03-01 ENCOUNTER — Telehealth: Payer: Self-pay | Admitting: Pulmonary Disease

## 2020-03-01 NOTE — Telephone Encounter (Signed)
03/01/2020  Can you please contact the patient and notify her that we have received a message from cardiology stating that they are not managing the patient's potassium.  Patient needs to contact primary care regarding this.  Wyn Quaker, FNP

## 2020-03-01 NOTE — Telephone Encounter (Signed)
I called to speak to patient about message from Cardiology on potassium. There was no answer, left VM detailing Brian's recs and told to call our office with any questions. nothing further needed.

## 2020-03-12 NOTE — Progress Notes (Signed)
[place]  [phone] [phone]) [phone]     ID: Debbie [name] DOB: [birth year]  MR#: [id number]  GYB#:[id number]  Patient Care Team: Mateo Flow, MD as PCP - General (Family Medicine) Elouise Munroe, MD as Consulting Physician (Cardiology) Tat, Eustace Quail, DO as Consulting Physician (Neurology) Magrinat, Virgie Dad, MD as Consulting Physician (Oncology) Molli Posey, MD as Consulting Physician (Obstetrics and Gynecology) Simon Rhein, MD as Resident (Neurology) Aurea Graff OTHER MD:  I connected with [name] on 03/12/20 at 12:30 PM EDT by telephone visit and verified that I am speaking with the correct person using two identifiers.   I discussed the limitations, risks, security and privacy concerns of performing an evaluation and management service by telemedicine and the availability of in-person appointments. I also discussed with the patient that there may be a patient responsible charge related to this service. The patient expressed understanding and agreed to proceed.   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  However she did not answer the phone (my call was 15 minutes late) and I left a message. She started anastrozole on 01/24/2020.  She is scheduled for breast MRI on 03/24/2020.   REVIEW OF SYSTEMS: Mardene Celeste    HISTORY OF CURRENT ILLNESS: MOLLY SAVARINO has a family history of cancer in her maternal grandfather. He was diagnosed with metastatic cancer involving lung pancreas and colon.  It is not clear what the primary was.  He was 48 at the time of death..  With this  background when Robynn most recently saw Dr. 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  The patient's subsequent history is as detailed below.   PAST MEDICAL HISTORY: Past Medical History:  Diagnosis Date  . Allergy   . Anemia   . Anorexia nervosa with bulimia 2004  . Arthritis    ra  . Gastroparesis   . GERD (gastroesophageal reflux disease)   . History of kidney stones   . Lupus ([place])   . Menopause   . Pain and swelling of right forearm    resolved  . POTS (postural orthostatic tachycardia syndrome)    ?pots per 08-02-2018 cardiology note folllow up prn  . Skin abnormalities 07/07/2019   right leg above knee pinhole area had fro months scabs over, will bleed if scraped  . SLE (systemic lupus erythematosus) ([place]) 08/29/2016  POTS syndrome   PAST SURGICAL HISTORY: Past Surgical History:  Procedure Laterality Date  . ABDOMINAL HYSTERECTOMY     2x,2008-2009 partial  . CARPAL TUNNEL RELEASE Bilateral   . CHOLECYSTECTOMY    . CYSTOSCOPY Bilateral 07/13/2019   Procedure: CYSTOSCOPY;  Surgeon: Molli Posey, MD;  [facility];  Service: Gynecology;  Laterality: Bilateral;  . CYSTOSCOPY WITH RETROGRADE PYELOGRAM, URETEROSCOPY AND STENT PLACEMENT Left 06/22/2015   Procedure: LEFT URETEROSCOPY AND STONE EXTRACTION ;  Surgeon: Kathie Rhodes, MD;  Location: [facility];  Service: Urology;  Laterality: Left;  . KIDNEY STONE SURGERY     5  . LAPAROSCOPIC BILATERAL SALPINGO OOPHERECTOMY Bilateral 07/13/2019   Procedure: LAPAROSCOPIC BILATERAL SALPINGO OOPHORECTOMY;  Surgeon: Molli Posey, MD;  Location:  [place];  Service: Gynecology;  Laterality: Bilateral;  . OVARIAN CYST SURGERY  2007   LAP    FAMILY HISTORY: Family History  Problem Relation Age of Onset  . Arthritis Mother        rheumatoid   . Hernia Mother   . Hypertension Mother   . Heart attack Mother   . Diabetes Father   .  Lupus Sister   . Diabetes Maternal Grandmother   . Cancer Maternal Grandfather   . Pancreatic cancer Maternal Grandfather 80  . Lung cancer Maternal Grandfather 2  . Colon cancer Maternal Grandfather 31       colorectal  . Diabetes Paternal Grandfather   . Heart disease Paternal Grandfather   . Rheum arthritis Daughter   . Hashimoto's thyroiditis Daughter   . Heart disease Paternal Grandmother   . Diabetes Other    [name] died at age 71 from complications of diabetes.  His parents had no history of cancer.  [name] had 2 sisters and 3 brothers, with no cancer in any of those siblings.  The patient's mother is 48 years old as of December 2020.  Her father had metastatic cancer but the patient does not know what the primary was.  The maternal grandmother did not have cancer and neither did this single maternal aunt.   GYNECOLOGIC HISTORY:  No LMP recorded. Patient has had a hysterectomy. Menarche: 49 years old Age at first live birth: 49 years old [place] P 2 LMP age 49 (with hysterectomy HRT estrogen alone Hysterectomy? Yes, at age 49 BSO? 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  Her husband [name] works for Marketing executive.  There is son Ysidro Evert, 87 as of December 2020 lives in [place] and works Biomedical engineer towers.  Daughter Estill Bamberg, 21, is a Development worker, community.  The patient has no grandchildren.  She attends a [employer] DIRECTIVES: In the absence of any documents to the contrary the patient's husband is her healthcare power of attorney   HEALTH MAINTENANCE: Social History   Tobacco Use  . Smoking status: Never Smoker  . Smokeless tobacco: Never Used  Vaping Use  . Vaping Use: Never used  Substance Use Topics  . Alcohol use: No    Comment: rarely   . Drug use: No     Colonoscopy: 2016 (Dr. Oletta Lamas)  PAP: 01/2016, negative  Bone density:  Never   Allergies  Allergen Reactions  . Zofran [Ondansetron Hcl] Other (See Comments)    Stutters and looses control of her arm and legs  . Metoclopramide     "stutters and loses control of arms of legs"    Current Outpatient Medications  Medication Sig Dispense Refill  . albuterol (VENTOLIN HFA) 108 (90 Base) MCG/ACT inhaler Inhale into the lungs.    Marland Kitchen anastrozole (ARIMIDEX) 1 MG tablet Take 1 tablet (1 mg total) by mouth daily. (Patient not taking: Reported on 02/13/2020) 90 tablet 4  . Ascorbic Acid (VITAMIN C PO) Take by mouth.    . Budeson-Glycopyrrol-Formoterol (BREZTRI AEROSPHERE) 160-9-4.8 MCG/ACT AERO Inhale 2 puffs into the lungs in the morning and at bedtime.    . cetirizine (ZYRTEC) 10 MG tablet Take 10 mg by mouth daily as needed for allergies.     . Cyanocobalamin (VITAMIN B-12 PO) Take by mouth.    . Esomeprazole Magnesium (NEXIUM PO) Take 40 mg by mouth daily.     Marland Kitchen  Fluticasone Propionate (FLONASE ALLERGY RELIEF NA) Place into the nose as needed.     . gabapentin (NEURONTIN) 100 MG capsule Take 100 mg by mouth 3 (three) times daily as needed.    . hydroxychloroquine (PLAQUENIL) 200 MG tablet TAKE 1 TABLET EACH MORNING AND 1/2 TABLET AT BEDTIME 135 tablet 0  . Multiple Vitamins-Minerals (MULTIVITAMIN ADULT PO) Take by mouth daily.    . Multiple Vitamins-Minerals (ZINC PO) Take 1 tablet by mouth in the morning and at bedtime.    . Oral Electrolytes (THERMOTABS PO) Take 1 tablet by mouth daily.    . polyethylene glycol (MIRALAX / GLYCOLAX) packet Take 17 g by mouth 3 (three) times daily.     . promethazine (PHENERGAN) 25 MG tablet Take 25 mg by mouth every 6 (six) hours as needed for nausea or vomiting.    Marland Kitchen VITAMIN D PO Take by mouth.     No current facility-administered medications for this visit.    OBJECTIVE: Y  There were no vitals filed for this visit.   There is no height or weight on file to calculate BMI.   Wt Readings from Last 3 Encounters:  02/27/20 164 lb  (74.4 kg)  02/13/20 164 lb 3.2 oz (74.5 kg)  07/13/19 154 lb 11.2 oz (70.2 kg)      ECOG FS:1 - Symptomatic but completely ambulatory  Telemedicine visit 03/13/2020  LAB RESULTS:  CMP     Component Value Date/Time   NA 137 03/02/2018 1610   K 4.4 03/02/2018 1610   CL 103 03/02/2018 1610   CO2 28 03/02/2018 1610   GLUCOSE 78 03/02/2018 1610   BUN 12 03/02/2018 1610   CREATININE 0.68 03/02/2018 1610   CALCIUM 9.5 03/02/2018 1610   PROT 7.2 03/02/2018 1610   ALBUMIN 3.9 11/11/2016 1624   AST 80 (H) 03/02/2018 1610   ALT 148 (H) 03/02/2018 1610   ALKPHOS 180 (H) 11/11/2016 1624   BILITOT 0.4 03/02/2018 1610   GFRNONAA 106 03/02/2018 1610   GFRAA 122 03/02/2018 1610    No results found for: TOTALPROTELP, ALBUMINELP, A1GS, A2GS, BETS, BETA2SER, GAMS, MSPIKE, SPEI  No results found for: KPAFRELGTCHN, LAMBDASER, KAPLAMBRATIO  Lab Results  Component Value Date   WBC 8.9 03/02/2018   NEUTROABS 4,931 03/02/2018   HGB 12.0 03/02/2018   HCT 35.0 03/02/2018   MCV 84.7 03/02/2018   PLT 410 (H) 03/02/2018   No results found for: LABCA2  No components found for: UQJFHL456  No results for input(s): INR in the last 168 hours.  No results found for: LABCA2  No results found for: YBW389  No results found for: HTD428  No results found for: JGO115  No results found for: CA2729  No components found for: HGQUANT  No results found for: CEA1 / No results found for: CEA1   No results found for: AFPTUMOR  No results found for: CHROMOGRNA  No results found for: HGBA, HGBA2QUANT, HGBFQUANT, HGBSQUAN (Hemoglobinopathy evaluation)   No results found for: LDH  No results found for: IRON, TIBC, IRONPCTSAT (Iron and TIBC)  No results found for: FERRITIN  Urinalysis    Component Value Date/Time   COLORURINE YELLOW 03/02/2016 0132   APPEARANCEUR CLEAR 03/02/2016 0132   LABSPEC 1.002 (L) 03/02/2016 0132   PHURINE 7.0 03/02/2016 0132   GLUCOSEU NEGATIVE 03/02/2016 0132    HGBUR NEGATIVE 03/02/2016 0132   BILIRUBINUR NEGATIVE 03/02/2016 0132   BILIRUBINUR NEG 10/11/2012 1728   KETONESUR NEGATIVE 03/02/2016 0132   PROTEINUR NEGATIVE 03/02/2016  0132   UROBILINOGEN 0.2 10/12/2012 1417   NITRITE NEGATIVE 03/02/2016 0132   LEUKOCYTESUR NEGATIVE 03/02/2016 0132    STUDIES: DG Chest 2 View  Result Date: 02/28/2020 CLINICAL DATA:  Cough. EXAM: CHEST - 2 VIEW COMPARISON:  July 18, 2016. FINDINGS: The heart size and mediastinal contours are within normal limits. Both lungs are clear. The visualized skeletal structures are unremarkable. IMPRESSION: No active cardiopulmonary disease. Electronically Signed   By: Marijo Conception M.D.   On: 02/28/2020 20:52     ELIGIBLE FOR AVAILABLE RESEARCH PROTOCOL: no  ASSESSMENT: 49 y.o. 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  She is scheduled for breast MRI early November.  I am going to give a virtual visit a second try the second week in November and I left her a phone message saying that    Aurea Graff   03/12/2020 11:56 PM Medical Oncology and Hematology [facility] [place], [place] [address] Tel. [phone]    Fax. [phone]   I, Wilburn Mylar, am acting as scribe for Dr. Virgie Dad. Magrinat.  I, Lurline Del MD, have reviewed the above documentation for accuracy and completeness, and I agree with the above.   *Total Encounter Time as defined by the Centers for Medicare and Medicaid Services includes, in addition to the face-to-face time of a patient visit (documented in the note above) non-face-to-face time: obtaining and reviewing  outside history, ordering and reviewing medications, tests or procedures, care coordination (communications with other health care professionals or caregivers) and documentation in the medical record.

## 2020-03-13 ENCOUNTER — Inpatient Hospital Stay: Payer: BC Managed Care – PPO | Attending: Oncology | Admitting: Oncology

## 2020-03-13 DIAGNOSIS — Z9189 Other specified personal risk factors, not elsewhere classified: Secondary | ICD-10-CM

## 2020-03-22 ENCOUNTER — Ambulatory Visit (INDEPENDENT_AMBULATORY_CARE_PROVIDER_SITE_OTHER): Payer: BC Managed Care – PPO | Admitting: Pulmonary Disease

## 2020-03-22 ENCOUNTER — Other Ambulatory Visit: Payer: Self-pay

## 2020-03-22 DIAGNOSIS — R059 Cough, unspecified: Secondary | ICD-10-CM

## 2020-03-22 LAB — PULMONARY FUNCTION TEST
DL/VA % pred: 107 %
DL/VA: 4.72 ml/min/mmHg/L
DLCO cor % pred: 100 %
DLCO cor: 19.93 ml/min/mmHg
DLCO unc % pred: 100 %
DLCO unc: 19.93 ml/min/mmHg
FEF 25-75 Post: 2.75 L/sec
FEF 25-75 Pre: 2.91 L/sec
FEF2575-%Change-Post: -5 %
FEF2575-%Pred-Post: 99 %
FEF2575-%Pred-Pre: 105 %
FEV1-%Change-Post: -1 %
FEV1-%Pred-Post: 90 %
FEV1-%Pred-Pre: 91 %
FEV1-Post: 2.42 L
FEV1-Pre: 2.47 L
FEV1FVC-%Change-Post: 1 %
FEV1FVC-%Pred-Pre: 106 %
FEV6-%Change-Post: -2 %
FEV6-%Pred-Post: 84 %
FEV6-%Pred-Pre: 87 %
FEV6-Post: 2.78 L
FEV6-Pre: 2.86 L
FEV6FVC-%Pred-Post: 102 %
FEV6FVC-%Pred-Pre: 102 %
FVC-%Change-Post: -2 %
FVC-%Pred-Post: 82 %
FVC-%Pred-Pre: 85 %
FVC-Post: 2.78 L
FVC-Pre: 2.86 L
Post FEV1/FVC ratio: 87 %
Post FEV6/FVC ratio: 100 %
Pre FEV1/FVC ratio: 86 %
Pre FEV6/FVC Ratio: 100 %
RV % pred: 86 %
RV: 1.41 L
TLC % pred: 91 %
TLC: 4.36 L

## 2020-03-22 NOTE — Progress Notes (Signed)
Full PFT performed today. °

## 2020-03-23 ENCOUNTER — Ambulatory Visit: Payer: BC Managed Care – PPO | Admitting: Internal Medicine

## 2020-03-24 ENCOUNTER — Other Ambulatory Visit: Payer: Self-pay

## 2020-03-24 ENCOUNTER — Ambulatory Visit
Admission: RE | Admit: 2020-03-24 | Discharge: 2020-03-24 | Disposition: A | Discharge status: Home / Self Care | Payer: BC Managed Care – PPO | Source: Ambulatory Visit | Attending: Oncology | Admitting: Oncology

## 2020-03-24 DIAGNOSIS — M3219 Other organ or system involvement in systemic lupus erythematosus: Secondary | ICD-10-CM

## 2020-03-24 DIAGNOSIS — Z9189 Other specified personal risk factors, not elsewhere classified: Secondary | ICD-10-CM

## 2020-03-24 DIAGNOSIS — Z1239 Encounter for other screening for malignant neoplasm of breast: Secondary | ICD-10-CM

## 2020-03-24 DIAGNOSIS — M0579 Rheumatoid arthritis with rheumatoid factor of multiple sites without organ or systems involvement: Secondary | ICD-10-CM

## 2020-03-24 MED ORDER — GADOBUTROL 1 MMOL/ML IV SOLN
7.0000 mL | Freq: Once | INTRAVENOUS | Status: AC | PRN
  Administered 2020-03-24: 7 mL via INTRAVENOUS

## 2020-03-26 ENCOUNTER — Other Ambulatory Visit: Payer: Self-pay | Admitting: Oncology

## 2020-03-26 ENCOUNTER — Telehealth: Payer: Self-pay | Admitting: *Deleted

## 2020-03-26 ENCOUNTER — Other Ambulatory Visit: Payer: Self-pay | Admitting: *Deleted

## 2020-03-26 DIAGNOSIS — N6310 Unspecified lump in the right breast, unspecified quadrant: Secondary | ICD-10-CM

## 2020-03-26 DIAGNOSIS — R928 Other abnormal and inconclusive findings on diagnostic imaging of breast: Secondary | ICD-10-CM

## 2020-03-26 DIAGNOSIS — N6320 Unspecified lump in the left breast, unspecified quadrant: Secondary | ICD-10-CM

## 2020-03-26 DIAGNOSIS — Z9189 Other specified personal risk factors, not elsewhere classified: Secondary | ICD-10-CM

## 2020-03-26 NOTE — Telephone Encounter (Signed)
Left message for a return phone call concerning her recent MRI.

## 2020-03-28 ENCOUNTER — Telehealth: Payer: Self-pay | Admitting: *Deleted

## 2020-03-28 ENCOUNTER — Other Ambulatory Visit: Payer: Self-pay | Admitting: Oncology

## 2020-03-28 DIAGNOSIS — N6320 Unspecified lump in the left breast, unspecified quadrant: Secondary | ICD-10-CM

## 2020-03-28 DIAGNOSIS — N6310 Unspecified lump in the right breast, unspecified quadrant: Secondary | ICD-10-CM

## 2020-03-28 DIAGNOSIS — Z9189 Other specified personal risk factors, not elsewhere classified: Secondary | ICD-10-CM

## 2020-03-28 DIAGNOSIS — R928 Other abnormal and inconclusive findings on diagnostic imaging of breast: Secondary | ICD-10-CM

## 2020-03-28 NOTE — Telephone Encounter (Signed)
Called patient again and was able to reach her and let her know the results from the MRI. She is aware that she will be scheduled for left and right MRI bx.  Informed her that someone from GI will call her with appointments.

## 2020-04-04 ENCOUNTER — Other Ambulatory Visit: Payer: Self-pay | Admitting: *Deleted

## 2020-04-04 ENCOUNTER — Telehealth: Payer: Self-pay | Admitting: *Deleted

## 2020-04-04 MED ORDER — DIAZEPAM 5 MG PO TABS: ORAL_TABLET | ORAL | 0 refills | Status: DC

## 2020-04-04 NOTE — Telephone Encounter (Signed)
Per VM left by pt- valium called to local pharmacy on chart for schedule MRI biopsy.  This RN returned call to pt and left VM per above.

## 2020-04-04 NOTE — Telephone Encounter (Signed)
Pt called and requested prescription to be sent to [facility] in [place].

## 2020-04-05 ENCOUNTER — Other Ambulatory Visit: Payer: Self-pay

## 2020-04-05 ENCOUNTER — Ambulatory Visit
Admission: RE | Admit: 2020-04-05 | Discharge: 2020-04-05 | Disposition: A | Discharge status: Home / Self Care | Payer: BC Managed Care – PPO | Source: Ambulatory Visit | Attending: Oncology | Admitting: Oncology

## 2020-04-05 ENCOUNTER — Other Ambulatory Visit: Payer: Self-pay | Admitting: Body Imaging

## 2020-04-05 DIAGNOSIS — R928 Other abnormal and inconclusive findings on diagnostic imaging of breast: Secondary | ICD-10-CM

## 2020-04-05 DIAGNOSIS — N6310 Unspecified lump in the right breast, unspecified quadrant: Secondary | ICD-10-CM

## 2020-04-05 DIAGNOSIS — Z9189 Other specified personal risk factors, not elsewhere classified: Secondary | ICD-10-CM

## 2020-04-05 DIAGNOSIS — N6320 Unspecified lump in the left breast, unspecified quadrant: Secondary | ICD-10-CM

## 2020-04-05 MED ORDER — GADOBUTROL 1 MMOL/ML IV SOLN
6.0000 mL | Freq: Once | INTRAVENOUS | Status: AC | PRN
  Administered 2020-04-05: 6 mL via INTRAVENOUS

## 2020-04-06 ENCOUNTER — Telehealth: Payer: Self-pay | Admitting: *Deleted

## 2020-04-06 NOTE — Telephone Encounter (Signed)
Per MD review of breast biopsy performed yesterday- benign readings- called and informed pt per her request.  Verified MyChart visit for next week for MD to discuss further.  No further questions at this time.

## 2020-04-10 NOTE — Progress Notes (Signed)
[place]  Telephone:([phone]) [phone] Fax:(336) [phone]     ID: Debbie [name] DOB: [birth year]  MR#: [id number]  YFV#:[id number]  Patient Care Team: Mateo Flow, MD as PCP - General (Family Medicine) Elouise Munroe, MD as Consulting Physician (Cardiology) Tat, Eustace Quail, DO as Consulting Physician (Neurology) Loys Shugars, Virgie Dad, MD as Consulting Physician (Oncology) Molli Posey, MD as Consulting Physician (Obstetrics and Gynecology) Simon Rhein, MD as Resident (Neurology) Chauncey Cruel, MD OTHER MD:  I connected with [name] on 04/11/20 at 11:30 AM EST by telephone visit and verified that I am speaking with the correct person using two identifiers.   I discussed the limitations, risks, security and privacy concerns of performing an evaluation and management service by telemedicine and the availability of in-person appointments. I also discussed with the patient that there may be a patient responsible charge related to this service. The patient expressed understanding and agreed to proceed.   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    She has not yet started anastrozole (was supposed to have started second week in September).  Instead she was evaluated for persistent cough, with multiple negative COVID-19 tests, and evaluation by pulmonary in the [facility] (Dr. Halford Chessman).  Her second Covid vaccine was accordingly delayed and she finally received it in October.  Since her last visit, she underwent breast MRI on 03/24/2020 showing: breast composition C; small amount of clumped non-mass enhancement in lower-central  right breast measuring 1.2 cm; linear non-mass enhancement in retroareolar left breast spanning 1.9 cm.  She proceeded to biopsy of the left breast area in question on 04/05/2020. Pathology from the procedure ([id number]) showed: microscopic detached fragments suspicious for papilloma; pseudoangiomatous stromal hyperplasia (PASH); fibrocystic change with apocrine metaplasia.   REVIEW OF SYSTEMS: Debbie [name] is very stressed because of her work.  She has no vacation time left she says.  In addition she is very concerned about the findings in the recent biopsies.  She did not think she wanted to start the anastrozole while she had the cough and they were trying to figure out if she had Covid.  She is willing to start it at this point.  Detailed review of systems today was otherwise stable.  COVID 19 VACCINATION STATUS: [employer] vaccine October 2021   HISTORY OF CURRENT ILLNESS: From the original intake note:  Debbie [name] has a family history of cancer in her maternal grandfather. He was diagnosed with metastatic cancer involving lung pancreas and colon.  It is not clear what the primary was.  He was 75 at the time of death..  With this background when Trent most recently saw Dr. 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  The patient's subsequent history is as detailed below.   PAST MEDICAL HISTORY: Past Medical History:  Diagnosis Date  . Allergy   . Anemia   . Anorexia nervosa with bulimia 2004  . Arthritis    ra  . Gastroparesis   . GERD (gastroesophageal reflux disease)   . History of kidney stones   . Lupus ([place])   . Menopause   . Pain and  swelling of right forearm    resolved  . POTS (postural orthostatic tachycardia syndrome)    ?pots per 08-02-2018 cardiology note folllow up prn  . Skin abnormalities 07/07/2019   right leg above knee pinhole area had fro months scabs over, will bleed if scraped  . SLE  (systemic lupus erythematosus) ([place]) 08/29/2016  POTS syndrome   PAST SURGICAL HISTORY: Past Surgical History:  Procedure Laterality Date  . ABDOMINAL HYSTERECTOMY     2x,2008-2009 partial  . CARPAL TUNNEL RELEASE Bilateral   . CHOLECYSTECTOMY    . CYSTOSCOPY Bilateral 07/13/2019   Procedure: CYSTOSCOPY;  Surgeon: Molli Posey, MD;  Location[facility];  Service: Gynecology;  Laterality: Bilateral;  . CYSTOSCOPY WITH RETROGRADE PYELOGRAM, URETEROSCOPY AND STENT PLACEMENT Left 06/22/2015   Procedure: LEFT URETEROSCOPY AND STONE EXTRACTION ;  Surgeon: Kathie Rhodes, MD;  Location: [facility];  Service: Urology;  Laterality: Left;  . KIDNEY STONE SURGERY     5  . LAPAROSCOPIC BILATERAL SALPINGO OOPHERECTOMY Bilateral 07/13/2019   Procedure: LAPAROSCOPIC BILATERAL SALPINGO OOPHORECTOMY;  Surgeon: Molli Posey, MD;  Location: [facility];  Service: Gynecology;  Laterality: Bilateral;  . OVARIAN CYST SURGERY  2007   LAP    FAMILY HISTORY: Family History  Problem Relation Age of Onset  . Arthritis Mother        rheumatoid   . Hernia Mother   . Hypertension Mother   . Heart attack Mother   . Diabetes Father   . Lupus Sister   . Diabetes Maternal Grandmother   . Cancer Maternal Grandfather   . Pancreatic cancer Maternal Grandfather 102  . Lung cancer Maternal Grandfather 84  . Colon cancer Maternal Grandfather 61       colorectal  . Diabetes Paternal Grandfather   . Heart disease Paternal Grandfather   . Rheum arthritis Daughter   . Hashimoto's thyroiditis Daughter   . Heart disease Paternal Grandmother   . Diabetes Other    [name] died at age 27 from complications of diabetes.  His parents had no history of cancer.  [name] had 2 sisters and 3 brothers, with no cancer in any of those siblings.  The patient's mother is 21 years old as of December 2020.  Her father had metastatic cancer but the patient does not know what the  primary was.  The maternal grandmother did not have cancer and neither did this single maternal aunt.   GYNECOLOGIC HISTORY:  No LMP recorded. Patient has had a hysterectomy. Menarche: 49 years old Age at first live birth: 49 years old [place] P 2 LMP age 32 (with hysterectomy HRT estrogen alone Hysterectomy? Yes, at age 41 BSO? 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  Her husband [name] works for Marketing executive.  There is son Ysidro Evert, 46 as of December 2020 lives in [place] and works Biomedical engineer towers.  Daughter Estill Bamberg, 21, is a Development worker, community.  The patient has no grandchildren.  She attends a [place] DIRECTIVES: In the absence of any documents to the contrary the patient's husband is her healthcare power of attorney   HEALTH MAINTENANCE: Social History   Tobacco Use  . Smoking status: Never Smoker  . Smokeless tobacco: Never Used  Vaping Use  . Vaping Use: Never used  Substance Use Topics  . Alcohol use: No    Comment: rarely   . Drug use:  No     Colonoscopy: 2016 (Dr. Oletta Lamas)  PAP: 01/2016, negative  Bone density: Never   Allergies  Allergen Reactions  . Zofran [Ondansetron Hcl] Other (See Comments)    Stutters and looses control of her arm and legs  . Metoclopramide     "stutters and loses control of arms of legs"    Current Outpatient Medications  Medication Sig Dispense Refill  . albuterol (VENTOLIN HFA) 108 (90 Base) MCG/ACT inhaler Inhale into the lungs.    Marland Kitchen anastrozole (ARIMIDEX) 1 MG tablet Take 1 tablet (1 mg total) by mouth daily. (Patient not taking: Reported on 02/13/2020) 90 tablet 4  . Ascorbic Acid (VITAMIN C PO) Take by mouth.    . Budeson-Glycopyrrol-Formoterol (BREZTRI AEROSPHERE) 160-9-4.8 MCG/ACT AERO Inhale 2 puffs into the lungs in the morning and at bedtime.    . cetirizine (ZYRTEC) 10 MG tablet Take 10 mg by mouth daily as needed for allergies.      . Cyanocobalamin (VITAMIN B-12 PO) Take by mouth.    . diazepam (VALIUM) 5 MG tablet 1 po 1 hour prior to MRI and may repeat x 1 2 tablet 0  . Esomeprazole Magnesium (NEXIUM PO) Take 40 mg by mouth daily.     . Fluticasone Propionate (FLONASE ALLERGY RELIEF NA) Place into the nose as needed.     . gabapentin (NEURONTIN) 100 MG capsule Take 100 mg by mouth 3 (three) times daily as needed.    . hydroxychloroquine (PLAQUENIL) 200 MG tablet TAKE 1 TABLET EACH MORNING AND 1/2 TABLET AT BEDTIME 135 tablet 0  . Multiple Vitamins-Minerals (MULTIVITAMIN ADULT PO) Take by mouth daily.    . Multiple Vitamins-Minerals (ZINC PO) Take 1 tablet by mouth in the morning and at bedtime.    . Oral Electrolytes (THERMOTABS PO) Take 1 tablet by mouth daily.    . polyethylene glycol (MIRALAX / GLYCOLAX) packet Take 17 g by mouth 3 (three) times daily.     . promethazine (PHENERGAN) 25 MG tablet Take 25 mg by mouth every 6 (six) hours as needed for nausea or vomiting.    Marland Kitchen VITAMIN D PO Take by mouth.     No current facility-administered medications for this visit.    OBJECTIVE:   There were no vitals filed for this visit.   There is no height or weight on file to calculate BMI.   Wt Readings from Last 3 Encounters:  02/27/20 164 lb (74.4 kg)  02/13/20 164 lb 3.2 oz (74.5 kg)  07/13/19 154 lb 11.2 oz (70.2 kg)      ECOG FS:1 - Symptomatic but completely ambulatory  Telemedicine visit 04/11/2020  LAB RESULTS:  CMP     Component Value Date/Time   NA 137 03/02/2018 1610   K 4.4 03/02/2018 1610   CL 103 03/02/2018 1610   CO2 28 03/02/2018 1610   GLUCOSE 78 03/02/2018 1610   BUN 12 03/02/2018 1610   CREATININE 0.68 03/02/2018 1610   CALCIUM 9.5 03/02/2018 1610   PROT 7.2 03/02/2018 1610   ALBUMIN 3.9 11/11/2016 1624   AST 80 (H) 03/02/2018 1610   ALT 148 (H) 03/02/2018 1610   ALKPHOS 180 (H) 11/11/2016 1624   BILITOT 0.4 03/02/2018 1610   GFRNONAA 106 03/02/2018 1610   GFRAA 122 03/02/2018  1610    No results found for: TOTALPROTELP, ALBUMINELP, A1GS, A2GS, BETS, BETA2SER, GAMS, MSPIKE, SPEI  No results found for: KPAFRELGTCHN, LAMBDASER, KAPLAMBRATIO  Lab Results  Component Value Date   WBC 8.9 03/02/2018  NEUTROABS 4,931 03/02/2018   HGB 12.0 03/02/2018   HCT 35.0 03/02/2018   MCV 84.7 03/02/2018   PLT 410 (H) 03/02/2018   No results found for: LABCA2  No components found for: OZYYQM250  No results for input(s): INR in the last 168 hours.  No results found for: LABCA2  No results found for: IBB048  No results found for: GQB169  No results found for: IHW388  No results found for: CA2729  No components found for: HGQUANT  No results found for: CEA1 / No results found for: CEA1   No results found for: AFPTUMOR  No results found for: CHROMOGRNA  No results found for: HGBA, HGBA2QUANT, HGBFQUANT, HGBSQUAN (Hemoglobinopathy evaluation)   No results found for: LDH  No results found for: IRON, TIBC, IRONPCTSAT (Iron and TIBC)  No results found for: FERRITIN  Urinalysis    Component Value Date/Time   COLORURINE YELLOW 03/02/2016 0132   APPEARANCEUR CLEAR 03/02/2016 0132   LABSPEC 1.002 (L) 03/02/2016 0132   PHURINE 7.0 03/02/2016 0132   GLUCOSEU NEGATIVE 03/02/2016 0132   HGBUR NEGATIVE 03/02/2016 0132   BILIRUBINUR NEGATIVE 03/02/2016 0132   BILIRUBINUR NEG 10/11/2012 1728   KETONESUR NEGATIVE 03/02/2016 0132   PROTEINUR NEGATIVE 03/02/2016 0132   UROBILINOGEN 0.2 10/12/2012 1417   NITRITE NEGATIVE 03/02/2016 0132   LEUKOCYTESUR NEGATIVE 03/02/2016 0132    STUDIES: MR BREAST BILATERAL W WO CONTRAST INC CAD  Result Date: 03/25/2020 CLINICAL DATA:  High risk screening. Patient is asymptomatic. No previous breast surgeries. LABS:  None. EXAM: BILATERAL BREAST MRI WITH AND WITHOUT CONTRAST TECHNIQUE: Multiplanar, multisequence MR images of both breasts were obtained prior to and following the intravenous administration of 7 ml of Gadavist  Three-dimensional MR images were rendered by post-processing of the original MR data on an independent workstation. The three-dimensional MR images were interpreted, and findings are reported in the following complete MRI report for this study. Three dimensional images were evaluated at the independent interpreting workstation using the DynaCAD thin client. COMPARISON:  None. FINDINGS: Breast composition: c. Heterogeneous fibroglandular tissue. Background parenchymal enhancement: Mild Right breast: There is a small amount of clumped non mass enhancement in the lower central right breast (series 10, image 115) measuring approximately 1.2 x 0.7 cm. No additional abnormal areas of enhancement in the right breast. Left breast: There is a 0.5 cm enhancing mass with suggestion of an hilum in the outer left breast (series 10, image 89) which demonstrates T2 hyperintensity. This likely corresponds to a small mass in the outer left breast seen mammographically which has been stable dating back to November 2019, consistent with a benign finding. There is linear non mass enhancement in the retroareolar left breast middle depth spanning approximately 1.9 cm (series 10, image 67). No additional suspicious areas of enhancement in the left breast. Lymph nodes: No abnormal appearing lymph nodes. Ancillary findings:  None. IMPRESSION: 1. Small amount of clumped non mass enhancement in the lower central RIGHT breast measuring 1.2. 2. Linear non mass enhancement in the retroareolar LEFT breast spanning approximately 1.9 cm. RECOMMENDATION: Bilateral MRI guided biopsy of the non mass enhancement in each breast. BI-RADS CATEGORY  4: Suspicious. Electronically Signed   By: [name] M.D.   On: 03/25/2020 12:52   MM CLIP PLACEMENT LEFT  Result Date: 04/05/2020 CLINICAL DATA:  Post biopsy mammogram of the left breast for clip placement. EXAM: DIAGNOSTIC LEFT MAMMOGRAM POST MRI BIOPSY COMPARISON:  Previous exam(s). FINDINGS:  Mammographic images were obtained following MRI guided biopsy  of linear non mass enhancement in the central left breast. The biopsy marking clip is in expected position at the site of biopsy. IMPRESSION: Appropriate positioning of the dumbbell shaped biopsy marking clip at the site of biopsy in the central left breast. Final Assessment: Post Procedure Mammograms for Marker Placement Electronically Signed   By: Ammie Ferrier M.D.   On: 04/05/2020 12:16   MR LT BREAST BX W LOC DEV 1ST LESION IMAGE BX SPEC MR GUIDE  Addendum Date: 04/06/2020   ADDENDUM REPORT: 04/06/2020 14:47 ADDENDUM: Pathology revealed MICROSCOPIC DETACHED FRAGMENTS SUSPICIOUS FOR PAPILLOMA, PSEUDOANGIOMATOUS STROMAL HYPERPLASIA ([place], FIBROCYSTIC CHANGE WITH APOCRINE METAPLASIA of the Left breast, central. There is a microscopic detached epithelial fragment with features suspicious for ductal papilloma. This fragment is minute and not definitively diagnostic. This was found to be concordant by Dr. Ammie Ferrier, with surgical consultation for discussion of possible excision. If surgical excision is not perused, recommendation to continue with high risk screening. Pathology results were discussed with the patient by telephone. The patient reported doing well after the biopsy with tenderness at the site. Post biopsy instructions and care were reviewed and questions were answered. The patient was encouraged to call [facility] for any additional concerns. My direct phone number was provided. Surgical consultation has been arranged with Dr. Erroll Luna at [facility] on May 14, 2020. The patient was instructed to return for a bilateral breast MRI in 6 months, per protocol. Pathology results reported by Terie Purser, RN on 04/06/2020. Electronically Signed   By: Ammie Ferrier M.D.   On: 04/06/2020 14:47   Result Date: 04/06/2020 CLINICAL DATA:  49 year old female presenting for MRI  guided biopsies of both breasts. Upon scanning in preparation for the biopsy, only the enhancement in the left breast was visualized. EXAM: MRI GUIDED CORE NEEDLE BIOPSY OF THE RIGHT BREAST TECHNIQUE: Multiplanar, multisequence MR imaging of the left breast was performed both before and after administration of intravenous contrast. CONTRAST:  2m GADAVIST GADOBUTROL 1 MMOL/ML IV SOLN COMPARISON:  Previous exams. FINDINGS: I met with the patient, and we discussed the procedure of MRI guided biopsy, including risks, benefits, and alternatives. Specifically, we discussed the risks of infection, bleeding, tissue injury, clip migration, and inadequate sampling. Informed, written consent was given. The usual time out protocol was performed immediately prior to the procedure. Using sterile technique, 1% Lidocaine, MRI guidance, and a 9 gauge vacuum assisted device, biopsy was performed of linear non mass enhancement in the central left breast using a lateral approach. At the conclusion of the procedure, a dumbbell-shaped tissue marker clip was deployed into the biopsy cavity. Follow-up 2-view mammogram was performed and dictated separately. IMPRESSION: 1. MRI guided biopsy of linear non mass enhancement in the central left breast. No apparent complications. 2. The linear non mass enhancement in the right breast was not visualized on the initial preparatory images, and therefore the right breast biopsy was canceled. Electronically Signed: By: MAmmie FerrierM.D. 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  She understands this is going to cut her risk of developing breast cancer in half and particularly given her concerns in that regard is strongly recommended for her.  We again discussed the possibilities of hot flashes and so long but she understands she does not have to have any side effects from the medication to actually work.  She has been scheduled to meet with Dr. Brantley Stage because of the possible papilloma.  I suspect he will recommend repeat breast MRI in 6 months but I will be interested in his thinking.   I suggested go ahead and start anastrozole next Monday.  I will schedule another virtual visit in February so we can see how she is tolerating it and to decide whether to do an MRI, or mammography with ultrasonography in May or earlier  She knows to call for any other issue that may develop before then      Chauncey Cruel, MD   04/11/2020 11:26 AM Medical Oncology and Hematology [facility] [place], [place] [address] Tel. [phone]    Fax. [phone]   I, Wilburn Mylar, am acting as scribe for Dr. Virgie Dad. Jansen Goodpasture.  I, Lurline Del MD, have reviewed the above documentation for accuracy and completeness, and I agree with the above.   *Total Encounter Time as defined by the Centers for Medicare and Medicaid Services includes, in addition to the face-to-face time of a patient visit (documented in the note above) non-face-to-face time: obtaining and reviewing outside history, ordering and reviewing medications, tests or procedures, care coordination (communications with other health care professionals or caregivers) and documentation in the medical record.

## 2020-04-11 ENCOUNTER — Inpatient Hospital Stay: Payer: BC Managed Care – PPO | Attending: Oncology | Admitting: Oncology

## 2020-04-11 DIAGNOSIS — M328 Other forms of systemic lupus erythematosus: Secondary | ICD-10-CM | POA: Diagnosis not present

## 2020-04-11 DIAGNOSIS — Z1239 Encounter for other screening for malignant neoplasm of breast: Secondary | ICD-10-CM

## 2020-04-11 DIAGNOSIS — Z9189 Other specified personal risk factors, not elsewhere classified: Secondary | ICD-10-CM | POA: Diagnosis not present

## 2020-04-11 DIAGNOSIS — M0579 Rheumatoid arthritis with rheumatoid factor of multiple sites without organ or systems involvement: Secondary | ICD-10-CM

## 2020-04-11 MED ORDER — ANASTROZOLE 1 MG PO TABS: 1.0000 mg | ORAL_TABLET | Freq: Every day | ORAL | 4 refills | Status: DC

## 2020-04-13 ENCOUNTER — Telehealth: Payer: Self-pay | Admitting: Oncology

## 2020-04-13 ENCOUNTER — Other Ambulatory Visit: Payer: Self-pay

## 2020-04-13 NOTE — Telephone Encounter (Signed)
Scheduled appts per 11/24 los. Pt confirmed appt date and time.

## 2020-04-16 ENCOUNTER — Ambulatory Visit: Payer: BC Managed Care – PPO | Admitting: Pulmonary Disease

## 2020-05-14 ENCOUNTER — Ambulatory Visit: Payer: Self-pay | Admitting: Surgery

## 2020-05-14 DIAGNOSIS — D242 Benign neoplasm of left breast: Secondary | ICD-10-CM

## 2020-05-14 NOTE — H&P (Signed)
Debbie [name] Appointment: 05/14/2020 10:10 AM Location: [facility] Patient #: [id number] DOB: 1970-06-14 Married / Language: Debbie [name] / Race: White Female  History of Present Illness Debbie [name]; 05/14/2020 12:24 PM) Patient words: 49 y.o. 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 Jana [name] after magnetic resonance imaging showed an area of density in the left breast core biopsy proven to be consistent with fibrocystic change but there was a fragment of a papilloma in the specimen. She is high risk and is already had bilateral oophorectomy. She is now being maintained on anastrozole. She presents for discussion of LUMPECTOMY in the setting of probable papilloma This was only a fragment of a papilloma so it's difficult to discern from pathology. She has no history of nipple discharge or other symptom.  (1) ovarian cancer risk: (a) status post bilateral salpingo-oophorectomy 07/13/2019 with benign pathology  (2) breast cancer risk: (a) risk reduction with aromatase inhibitor: to start anastrozole (b) intensified screening  (3) breast MRI biopsy 04/05/2020 shows possible papilloma  (4) t anastrozole 04/16/2020       High risk screening. Patient is asymptomatic. No previous breast surgeries.  LABS: None.  EXAM: BILATERAL BREAST MRI WITH AND WITHOUT CONTRAST  TECHNIQUE: Multiplanar, multisequence MR images of both breasts were obtained prior to and following the intravenous administration of 7 ml of Gadavist  Three-dimensional MR images were rendered by post-processing of the original MR data on an independent workstation. The three-dimensional MR images were interpreted, and findings are reported in the following complete MRI report for this study. Three dimensional images were evaluated at the independent interpreting workstation using the DynaCAD thin  client.  COMPARISON: None.  FINDINGS: Breast composition: c. Heterogeneous fibroglandular tissue.  Background parenchymal enhancement: Mild  Right breast: There is a small amount of clumped non mass enhancement in the lower central right breast (series 10, image 115) measuring approximately 1.2 x 0.7 cm. No additional abnormal areas of enhancement in the right breast.  Left breast: There is a 0.5 cm enhancing mass with suggestion of an hilum in the outer left breast (series 10, image 89) which demonstrates T2 hyperintensity. This likely corresponds to a small mass in the outer left breast seen mammographically which has been stable dating back to November 2019, consistent with a benign finding. There is linear non mass enhancement in the retroareolar left breast middle depth spanning approximately 1.9 cm (series 10, image 67). No additional suspicious areas of enhancement in the left breast.  Lymph nodes: No abnormal appearing lymph nodes.  Ancillary findings: None.  IMPRESSION: 1. Small amount of clumped non mass enhancement in the lower central RIGHT breast measuring 1.2. 2. Linear non mass enhancement in the retroareolar LEFT breast spanning approximately 1.9 cm.  RECOMMENDATION: Bilateral MRI guided biopsy of the non mass enhancement in each breast.  BI-RADS CATEGORY 4: Suspicious.   Electronically Signed By: Debbie [name] M.D. On: 03/25/2020 12:52           Diagnosis Breast, left, needle core biopsy, central left - MICROSCOPIC DETACHED FRAGMENTS SUSPICIOUS FOR PAPILLOMA. - PSEUDOANGIOMATOUS STROMAL HYPERPLASIA (Debbie [name]). - FIBROCYSTIC CHANGE WITH APOCRINE METAPLASIA. - SEE MICROSCOPIC DESCRIPTION.  The patient is a 49 year old female.   Past Surgical History Debbie [name], [place]; 05/14/2020 10:28 AM) Breast Biopsy Left. Gallbladder Surgery - Laparoscopic Hysterectomy (not due to cancer) - Complete  Diagnostic Studies History  Debbie [name], CMA; 05/14/2020 10:28 AM) Colonoscopy 5-10 years ago Mammogram within last year  Pap Smear 1-5 years ago  Allergies Debbie [name], CMA; 05/14/2020 10:32 AM) Zofran *ANTIEMETICS* Allergies Reconciled  Medication History Debbie [name], CMA; 05/14/2020 10:35 AM) Anastrozole (1MG Tablet, Oral) Active. Esomeprazole Magnesium (40MG Capsule DR, Oral) Active. Hydroxychloroquine Sulfate (200MG Tablet, Oral) Active. Vitamin B Complex (1 (one) Oral) Active. Vitamin C (1 (one) Oral) Specific strength unknown - Active. Vitamin D (1 (one) Oral) Specific strength unknown - Active. Flonase (Nasal) Specific strength unknown - Active. Multiple Vitamin (1 (one) Oral) Active. Ergocalciferol (50 MCG(2000 UT) Capsule, Oral) Active. Glycopyrrolate (2MG Tablet, Oral) Active. Systane (0.4-0.3% Solution, Ophthalmic) Active. ZyrTEC Allergy (Oral) Specific strength unknown - Active. Medications Reconciled  Social History Debbie [name], [place]; 05/14/2020 10:28 AM) Alcohol use Occasional alcohol use. Caffeine use Coffee. No drug use Tobacco use Never smoker.  Family History Debbie [name], [place]; 05/14/2020 10:28 AM) Arthritis Daughter, Mother, Sister. Diabetes Mellitus Father. Hypertension Mother, Sister.  Pregnancy / Birth History Debbie [name], [place]; 05/14/2020 10:28 AM) Age at menarche 86 years. Age of menopause <46 Contraceptive History Depo-provera, Oral contraceptives. Gravida 2 Irregular periods Length (months) of breastfeeding 7-12 Maternal age 79-25 Para 2  Other Problems Debbie [name], [place]; 05/14/2020 10:28 AM) Arthritis Cholelithiasis Gastroesophageal Reflux Disease General anesthesia - complications Kidney Stone Oophorectomy     Review of Systems Debbie [name] CMA; 05/14/2020 10:28 AM) General Present- Fatigue and Weight Gain. Not Present- Appetite Loss, Chills, Fever, Night Sweats and Weight Loss. Skin  Present- Non-Healing Wounds. Not Present- Change in Wart/Mole, Dryness, Hives, Jaundice, New Lesions, Rash and Ulcer. HEENT Present- Seasonal Allergies and Wears glasses/contact lenses. Not Present- Earache, Hearing Loss, Hoarseness, Nose Bleed, Oral Ulcers, Ringing in the Ears, Sinus Pain, Sore Throat, Visual Disturbances and Yellow Eyes. Respiratory Present- Snoring. Not Present- Bloody sputum, Chronic Cough, Difficulty Breathing and Wheezing. Cardiovascular Present- Rapid Heart Rate and Swelling of Extremities. Not Present- Chest Pain, Difficulty Breathing Lying Down, Leg Cramps, Palpitations and Shortness of Breath. Female Genitourinary Present- Frequency. Not Present- Nocturia, Painful Urination, Pelvic Pain and Urgency. Musculoskeletal Present- Joint Stiffness and Swelling of Extremities. Not Present- Back Pain, Joint Pain, Muscle Pain and Muscle Weakness. Endocrine Present- Cold Intolerance and Hot flashes. Not Present- Excessive Hunger, Hair Changes, Heat Intolerance and New Diabetes. Hematology Present- Easy Bruising. Not Present- Blood Thinners, Excessive bleeding, Gland problems, HIV and Persistent Infections.  Vitals Debbie [name] CMA; 05/14/2020 10:36 AM) 05/14/2020 10:35 AM Weight: 169.13 lb Height: 61in Body Surface Area: 1.76 m Body Mass Index: 31.96 kg/m  Temp.: 98.69F  Pulse: 79 (Regular)  P.OX: 99% (Room air) BP: 120/88(Sitting, Left Arm, Standard)        Physical Exam (Debbie [name]; 05/14/2020 12:22 PM)  General Mental Status-Alert. General Appearance-Consistent with stated age. Hydration-Well hydrated. Voice-Normal.  Head and Neck Head-normocephalic, atraumatic with no lesions or palpable masses. Trachea-midline. Thyroid Gland Characteristics - normal size and consistency.  Eye Eyeball - Bilateral-Extraocular movements intact. Sclera/Conjunctiva - Bilateral-No scleral icterus.  Chest and Lung Exam Chest and lung  exam reveals -quiet, even and easy respiratory effort with no use of accessory muscles and on auscultation, normal breath sounds, no adventitious sounds and normal vocal resonance. Inspection Chest Wall - Normal. Back - normal.  Breast Breast - Left-Symmetric, Non Tender, No Biopsy scars, no Dimpling - Left, No Inflammation, No Lumpectomy scars, No Mastectomy scars, No Peau d' Orange. Breast - Right-Symmetric, Non Tender, No Biopsy scars, no Dimpling - Right, No Inflammation, No Lumpectomy scars, No Mastectomy scars, No Peau d' Orange. Breast Lump-No Palpable Breast Mass.  Cardiovascular Cardiovascular examination reveals -normal heart sounds, regular rate and rhythm with no murmurs and normal pedal pulses bilaterally.  Abdomen Inspection Inspection of the abdomen reveals - No Hernias. Skin - Scar - no surgical scars. Palpation/Percussion Palpation and Percussion of the abdomen reveal - Soft, Non Tender, No Rebound tenderness, No Rigidity (guarding) and No hepatosplenomegaly. Auscultation Auscultation of the abdomen reveals - Bowel sounds normal.  Neurologic Neurologic evaluation reveals -alert and oriented x 3 with no impairment of recent or remote memory. Mental Status-Normal.  Musculoskeletal Normal Exam - Left-Upper Extremity Strength Normal and Lower Extremity Strength Normal. Normal Exam - Right-Upper Extremity Strength Normal and Lower Extremity Strength Normal.  Lymphatic Head & Neck  General Head & Neck Lymphatics: Bilateral - Description - Normal. Axillary  General Axillary Region: Bilateral - Description - Normal. Tenderness - Non Tender. Femoral & Inguinal  Generalized Femoral & Inguinal Lymphatics: Bilateral - Description - Normal. Tenderness - Non Tender.    Assessment & Plan (Debbie [name]; 05/14/2020 12:23 PM)  PAPILLOMA OF LEFT BREAST (D24.2) Impression: Discussed the pros and cons of lumpectomy in her case. With her high-risk  state, I recommend left breast lumpectomy since is not clear exactly if this is a papilloma or fragment and 1. I discussed potential upgrade risk of up to 10% given her high risk I think this would be the best course of action for her. I also discussed observation with repeat imaging as an alternative to surgery. After weighing her options she opted for left breast seed lumpectomy for left breast papilloma Risk of lumpectomy include bleeding, infection, seroma, more surgery, use of seed/wire, wound care, cosmetic deformity and the need for other treatments, poor wound healing death , blood clots, death. Pt agrees to proceed.  Total time 45 minutes   AT HIGH RISK FOR BREAST CANCER (Z91.89)  Current Plans You are being scheduled for surgery- Our schedulers will call you.  You should hear from our office's scheduling department within 5 working days about the location, date, and time of surgery. We try to make accommodations for patient's preferences in scheduling surgery, but sometimes the OR schedule or the surgeon's schedule prevents [place] from making those accommodations.  If you have not heard from our office [phone]) in 5 working days, call the office and ask for your surgeon's nurse.  If you have other questions about your diagnosis, plan, or surgery, call the office and ask for your surgeon's nurse.  Pt Education - [facility] Breast Biopsy HCI: discussed with patient and provided information.

## 2020-06-12 ENCOUNTER — Other Ambulatory Visit: Payer: Self-pay | Admitting: Surgery

## 2020-06-12 DIAGNOSIS — D242 Benign neoplasm of left breast: Secondary | ICD-10-CM

## 2020-06-21 ENCOUNTER — Other Ambulatory Visit: Payer: Self-pay

## 2020-06-21 ENCOUNTER — Encounter (HOSPITAL_BASED_OUTPATIENT_CLINIC_OR_DEPARTMENT_OTHER): Payer: Self-pay | Admitting: Surgery

## 2020-06-25 ENCOUNTER — Other Ambulatory Visit (HOSPITAL_COMMUNITY)
Admission: RE | Admit: 2020-06-25 | Discharge: 2020-06-25 | Disposition: A | Discharge status: Home / Self Care | Payer: BC Managed Care – PPO | Source: Ambulatory Visit | Attending: Surgery | Admitting: Surgery

## 2020-06-25 ENCOUNTER — Encounter (HOSPITAL_BASED_OUTPATIENT_CLINIC_OR_DEPARTMENT_OTHER)
Admission: RE | Admit: 2020-06-25 | Discharge: 2020-06-25 | Disposition: A | Discharge status: Home / Self Care | Payer: BC Managed Care – PPO | Source: Ambulatory Visit | Attending: Surgery | Admitting: Surgery

## 2020-06-25 DIAGNOSIS — Z20822 Contact with and (suspected) exposure to covid-19: Secondary | ICD-10-CM | POA: Insufficient documentation

## 2020-06-25 DIAGNOSIS — Z01812 Encounter for preprocedural laboratory examination: Secondary | ICD-10-CM | POA: Insufficient documentation

## 2020-06-25 DIAGNOSIS — Z90722 Acquired absence of ovaries, bilateral: Secondary | ICD-10-CM | POA: Diagnosis not present

## 2020-06-25 DIAGNOSIS — D242 Benign neoplasm of left breast: Secondary | ICD-10-CM | POA: Diagnosis not present

## 2020-06-25 DIAGNOSIS — Z90710 Acquired absence of both cervix and uterus: Secondary | ICD-10-CM | POA: Diagnosis not present

## 2020-06-25 LAB — CBC WITH DIFFERENTIAL/PLATELET
Abs Immature Granulocytes: 0.02 10*3/uL (ref 0.00–0.07)
Basophils Absolute: 0.1 10*3/uL (ref 0.0–0.1)
Basophils Relative: 1 %
Eosinophils Absolute: 0.1 10*3/uL (ref 0.0–0.5)
Eosinophils Relative: 2 %
HCT: 39.1 % (ref 36.0–46.0)
Hemoglobin: 13.3 g/dL (ref 12.0–15.0)
Immature Granulocytes: 0 %
Lymphocytes Relative: 37 %
Lymphs Abs: 2.7 10*3/uL (ref 0.7–4.0)
MCH: 29 pg (ref 26.0–34.0)
MCHC: 34 g/dL (ref 30.0–36.0)
MCV: 85.2 fL (ref 80.0–100.0)
Monocytes Absolute: 0.6 10*3/uL (ref 0.1–1.0)
Monocytes Relative: 9 %
Neutro Abs: 3.7 10*3/uL (ref 1.7–7.7)
Neutrophils Relative %: 51 %
Platelets: 370 10*3/uL (ref 150–400)
RBC: 4.59 MIL/uL (ref 3.87–5.11)
RDW: 12.3 % (ref 11.5–15.5)
WBC: 7.3 10*3/uL (ref 4.0–10.5)
nRBC: 0 % (ref 0.0–0.2)

## 2020-06-25 LAB — COMPREHENSIVE METABOLIC PANEL
ALT: 75 U/L — ABNORMAL HIGH (ref 0–44)
AST: 43 U/L — ABNORMAL HIGH (ref 15–41)
Albumin: 3.9 g/dL (ref 3.5–5.0)
Alkaline Phosphatase: 133 U/L — ABNORMAL HIGH (ref 38–126)
Anion gap: 11 (ref 5–15)
BUN: 11 mg/dL (ref 6–20)
CO2: 24 mmol/L (ref 22–32)
Calcium: 9.7 mg/dL (ref 8.9–10.3)
Chloride: 105 mmol/L (ref 98–111)
Creatinine, Ser: 0.85 mg/dL (ref 0.44–1.00)
GFR, Estimated: >60 mL/min (ref >60)
Glucose, Bld: 89 mg/dL (ref 70–99)
Potassium: 4.4 mmol/L (ref 3.5–5.1)
Sodium: 140 mmol/L (ref 135–145)
Total Bilirubin: 0.4 mg/dL (ref 0.3–1.2)
Total Protein: 7 g/dL (ref 6.5–8.1)

## 2020-06-25 NOTE — Progress Notes (Signed)

## 2020-06-26 LAB — SARS CORONAVIRUS 2 (TAT 6-24 HRS): SARS Coronavirus 2: NEGATIVE

## 2020-06-27 ENCOUNTER — Ambulatory Visit
Admission: RE | Admit: 2020-06-27 | Discharge: 2020-06-27 | Disposition: A | Discharge status: Home / Self Care | Payer: BC Managed Care – PPO | Source: Ambulatory Visit | Attending: Surgery | Admitting: Surgery

## 2020-06-27 ENCOUNTER — Other Ambulatory Visit: Payer: Self-pay

## 2020-06-27 DIAGNOSIS — D242 Benign neoplasm of left breast: Secondary | ICD-10-CM

## 2020-06-27 NOTE — Anesthesia Preprocedure Evaluation (Addendum)
Anesthesia Evaluation  Patient identified by MRN, date of birth, ID band Patient awake    Reviewed: Allergy & Precautions, NPO status , Patient's Chart, lab work & pertinent test results  Airway Mallampati: I       Dental no notable dental hx.    Pulmonary neg pulmonary ROS,    Pulmonary exam normal        Cardiovascular negative cardio ROS Normal cardiovascular exam     Neuro/Psych  Headaches, PSYCHIATRIC DISORDERS Anxiety Depression  Neuromuscular disease    GI/Hepatic GERD  Medicated,  Endo/Other  negative endocrine ROS  Renal/GU      Musculoskeletal   Abdominal (+) + obese,   Peds  Hematology   Anesthesia Other Findings   Reproductive/Obstetrics                            Anesthesia Physical Anesthesia Plan  ASA: II  Anesthesia Plan: General   Post-op Pain Management:    Induction: Intravenous  PONV Risk Score and Plan: 3 and Ondansetron, Dexamethasone and Midazolam  Airway Management Planned: LMA  Additional Equipment: None  Intra-op Plan:   Post-operative Plan: Extubation in OR  Informed Consent: I have reviewed the patients History and Physical, chart, labs and discussed the procedure including the risks, benefits and alternatives for the proposed anesthesia with the patient or authorized representative who has indicated his/her understanding and acceptance.     Dental advisory given  Plan Discussed with: CRNA  Anesthesia Plan Comments:        Anesthesia Quick Evaluation

## 2020-06-28 ENCOUNTER — Encounter (HOSPITAL_BASED_OUTPATIENT_CLINIC_OR_DEPARTMENT_OTHER)
Admission: RE | Disposition: A | Discharge status: Home / Self Care | Payer: Self-pay | Source: Home / Self Care | Attending: Surgery

## 2020-06-28 ENCOUNTER — Ambulatory Visit
Admission: RE | Admit: 2020-06-28 | Discharge: 2020-06-28 | Disposition: A | Discharge status: Home / Self Care | Payer: BC Managed Care – PPO | Source: Ambulatory Visit | Attending: Surgery | Admitting: Surgery

## 2020-06-28 ENCOUNTER — Ambulatory Visit (HOSPITAL_BASED_OUTPATIENT_CLINIC_OR_DEPARTMENT_OTHER)
Admission: RE | Admit: 2020-06-28 | Discharge: 2020-06-28 | Disposition: A | Discharge status: Home / Self Care | Payer: BC Managed Care – PPO | Attending: Surgery | Admitting: Surgery

## 2020-06-28 ENCOUNTER — Ambulatory Visit (HOSPITAL_BASED_OUTPATIENT_CLINIC_OR_DEPARTMENT_OTHER): Payer: BC Managed Care – PPO | Admitting: Anesthesiology

## 2020-06-28 ENCOUNTER — Encounter (HOSPITAL_BASED_OUTPATIENT_CLINIC_OR_DEPARTMENT_OTHER): Payer: Self-pay | Admitting: Surgery

## 2020-06-28 ENCOUNTER — Other Ambulatory Visit: Payer: Self-pay

## 2020-06-28 DIAGNOSIS — Z90710 Acquired absence of both cervix and uterus: Secondary | ICD-10-CM | POA: Insufficient documentation

## 2020-06-28 DIAGNOSIS — Z20822 Contact with and (suspected) exposure to covid-19: Secondary | ICD-10-CM | POA: Insufficient documentation

## 2020-06-28 DIAGNOSIS — D242 Benign neoplasm of left breast: Secondary | ICD-10-CM | POA: Insufficient documentation

## 2020-06-28 DIAGNOSIS — Z90722 Acquired absence of ovaries, bilateral: Secondary | ICD-10-CM | POA: Insufficient documentation

## 2020-06-28 HISTORY — PX: BREAST LUMPECTOMY WITH RADIOACTIVE SEED LOCALIZATION: SHX6424

## 2020-06-28 SURGERY — BREAST LUMPECTOMY WITH RADIOACTIVE SEED LOCALIZATION
Anesthesia: General | Site: Breast | Laterality: Left

## 2020-06-28 MED ORDER — LIDOCAINE 2% (20 MG/ML) 5 ML SYRINGE
INTRAMUSCULAR | Status: AC
  Filled 2020-06-28: qty 5

## 2020-06-28 MED ORDER — IBUPROFEN 800 MG PO TABS: 800.0000 mg | ORAL_TABLET | Freq: Three times a day (TID) | ORAL | 0 refills | Status: DC | PRN

## 2020-06-28 MED ORDER — PROMETHAZINE HCL 25 MG/ML IJ SOLN
6.2500 mg | INTRAMUSCULAR | Status: DC | PRN
Start: 2020-06-28 — End: 2020-06-28

## 2020-06-28 MED ORDER — SODIUM CHLORIDE 0.9 % IV SOLN
INTRAVENOUS | Status: DC | PRN
  Administered 2020-06-28: 500 mL

## 2020-06-28 MED ORDER — LIDOCAINE HCL (CARDIAC) PF 100 MG/5ML IV SOSY
PREFILLED_SYRINGE | INTRAVENOUS | Status: DC | PRN
  Administered 2020-06-28: 100 mg via INTRATRACHEAL

## 2020-06-28 MED ORDER — FENTANYL CITRATE (PF) 100 MCG/2ML IJ SOLN
INTRAMUSCULAR | Status: AC
  Filled 2020-06-28: qty 2

## 2020-06-28 MED ORDER — SCOPOLAMINE 1 MG/3DAYS TD PT72
MEDICATED_PATCH | TRANSDERMAL | Status: AC
  Filled 2020-06-28: qty 1

## 2020-06-28 MED ORDER — LACTATED RINGERS IV SOLN: INTRAVENOUS | Status: DC

## 2020-06-28 MED ORDER — SODIUM CHLORIDE 0.9 % IV SOLN
INTRAVENOUS | Status: AC
  Filled 2020-06-28: qty 10

## 2020-06-28 MED ORDER — KETOROLAC TROMETHAMINE 30 MG/ML IJ SOLN: 30.0000 mg | Freq: Once | INTRAMUSCULAR | Status: DC | PRN

## 2020-06-28 MED ORDER — PROPOFOL 10 MG/ML IV BOLUS
INTRAVENOUS | Status: AC
  Filled 2020-06-28: qty 20

## 2020-06-28 MED ORDER — CEFAZOLIN SODIUM-DEXTROSE 2-4 GM/100ML-% IV SOLN
INTRAVENOUS | Status: AC
  Filled 2020-06-28: qty 100

## 2020-06-28 MED ORDER — PROPOFOL 500 MG/50ML IV EMUL
INTRAVENOUS | Status: DC | PRN
  Administered 2020-06-28: 150 ug/kg/min via INTRAVENOUS

## 2020-06-28 MED ORDER — OXYCODONE HCL 5 MG/5ML PO SOLN: 5.0000 mg | Freq: Once | ORAL | Status: AC | PRN

## 2020-06-28 MED ORDER — OXYCODONE HCL 5 MG PO TABS
5.0000 mg | ORAL_TABLET | Freq: Once | ORAL | Status: AC | PRN
  Administered 2020-06-28: 5 mg via ORAL

## 2020-06-28 MED ORDER — BUPIVACAINE-EPINEPHRINE (PF) 0.25% -1:200000 IJ SOLN
INTRAMUSCULAR | Status: DC | PRN
  Administered 2020-06-28: 30 mL

## 2020-06-28 MED ORDER — DEXAMETHASONE SODIUM PHOSPHATE 10 MG/ML IJ SOLN
INTRAMUSCULAR | Status: DC | PRN
  Administered 2020-06-28: 5 mg via INTRAVENOUS

## 2020-06-28 MED ORDER — DEXTROSE 5 % IV SOLN
3.0000 g | INTRAVENOUS | Status: DC
  Filled 2020-06-28: qty 3000

## 2020-06-28 MED ORDER — MIDAZOLAM HCL 2 MG/2ML IJ SOLN
INTRAMUSCULAR | Status: AC
  Filled 2020-06-28: qty 2

## 2020-06-28 MED ORDER — FENTANYL CITRATE (PF) 100 MCG/2ML IJ SOLN: 25.0000 ug | INTRAMUSCULAR | Status: DC | PRN

## 2020-06-28 MED ORDER — CEFAZOLIN SODIUM-DEXTROSE 2-3 GM-%(50ML) IV SOLR
INTRAVENOUS | Status: DC | PRN
  Administered 2020-06-28: 2 g via INTRAVENOUS

## 2020-06-28 MED ORDER — OXYCODONE HCL 5 MG PO TABS
ORAL_TABLET | ORAL | Status: AC
  Filled 2020-06-28: qty 1

## 2020-06-28 MED ORDER — CHLORHEXIDINE GLUCONATE CLOTH 2 % EX PADS: 6.0000 | MEDICATED_PAD | Freq: Once | CUTANEOUS | Status: DC

## 2020-06-28 MED ORDER — FENTANYL CITRATE (PF) 100 MCG/2ML IJ SOLN
INTRAMUSCULAR | Status: DC | PRN
  Administered 2020-06-28 (×3): 50 ug via INTRAVENOUS
  Administered 2020-06-28 (×2): 25 ug via INTRAVENOUS
  Administered 2020-06-28: 50 ug via INTRAVENOUS

## 2020-06-28 MED ORDER — OXYCODONE HCL 5 MG PO TABS: 5.0000 mg | ORAL_TABLET | Freq: Four times a day (QID) | ORAL | 0 refills | Status: DC | PRN

## 2020-06-28 MED ORDER — ACETAMINOPHEN 325 MG PO TABS
325.0000 mg | ORAL_TABLET | ORAL | Status: DC | PRN
Start: 2020-06-28 — End: 2020-06-28

## 2020-06-28 MED ORDER — PROPOFOL 10 MG/ML IV BOLUS
INTRAVENOUS | Status: DC | PRN
  Administered 2020-06-28: 20 mg via INTRAVENOUS
  Administered 2020-06-28: 150 mg via INTRAVENOUS
  Administered 2020-06-28: 30 mg via INTRAVENOUS

## 2020-06-28 MED ORDER — PROPOFOL 500 MG/50ML IV EMUL
INTRAVENOUS | Status: AC
  Filled 2020-06-28: qty 50

## 2020-06-28 MED ORDER — MIDAZOLAM HCL 5 MG/5ML IJ SOLN
INTRAMUSCULAR | Status: DC | PRN
  Administered 2020-06-28: 2 mg via INTRAVENOUS

## 2020-06-28 MED ORDER — DEXAMETHASONE SODIUM PHOSPHATE 10 MG/ML IJ SOLN
INTRAMUSCULAR | Status: AC
  Filled 2020-06-28: qty 1

## 2020-06-28 MED ORDER — MEPERIDINE HCL 25 MG/ML IJ SOLN: 6.2500 mg | INTRAMUSCULAR | Status: DC | PRN

## 2020-06-28 MED ORDER — ACETAMINOPHEN 160 MG/5ML PO SOLN: 325.0000 mg | ORAL | Status: DC | PRN

## 2020-06-28 MED ORDER — SCOPOLAMINE 1 MG/3DAYS TD PT72
MEDICATED_PATCH | TRANSDERMAL | Status: DC | PRN
  Administered 2020-06-28: 1 via TRANSDERMAL

## 2020-06-28 SURGICAL SUPPLY — 54 items
ADH SKN CLS APL DERMABOND .7 (GAUZE/BANDAGES/DRESSINGS) ×1
APL PRP STRL LF DISP 70% ISPRP (MISCELLANEOUS) ×1
APPLIER CLIP 9.375 MED OPEN (MISCELLANEOUS)
APR CLP MED 9.3 20 MLT OPN (MISCELLANEOUS)
BAG DECANTER FOR FLEXI CONT (MISCELLANEOUS) ×1 IMPLANT
BINDER BREAST LRG (GAUZE/BANDAGES/DRESSINGS) IMPLANT
BINDER BREAST MEDIUM (GAUZE/BANDAGES/DRESSINGS) IMPLANT
BINDER BREAST XLRG (GAUZE/BANDAGES/DRESSINGS) ×1 IMPLANT
BINDER BREAST XXLRG (GAUZE/BANDAGES/DRESSINGS) IMPLANT
BLADE SURG 15 STRL LF DISP TIS (BLADE) ×1 IMPLANT
BLADE SURG 15 STRL SS (BLADE) ×2
CANISTER SUC SOCK COL 7IN (MISCELLANEOUS) IMPLANT
CANISTER SUCT 1200ML W/VALVE (MISCELLANEOUS) IMPLANT
CHLORAPREP W/TINT 26 (MISCELLANEOUS) ×2 IMPLANT
CLIP APPLIE 9.375 MED OPEN (MISCELLANEOUS) IMPLANT
COVER BACK TABLE 60X90IN (DRAPES) ×2 IMPLANT
COVER MAYO STAND STRL (DRAPES) ×2 IMPLANT
COVER PROBE W GEL 5X96 (DRAPES) ×2 IMPLANT
COVER WAND RF STERILE (DRAPES) IMPLANT
DECANTER SPIKE VIAL GLASS SM (MISCELLANEOUS) IMPLANT
DERMABOND ADVANCED (GAUZE/BANDAGES/DRESSINGS) ×1
DERMABOND ADVANCED .7 DNX12 (GAUZE/BANDAGES/DRESSINGS) ×1 IMPLANT
DRAPE LAPAROSCOPIC ABDOMINAL (DRAPES) IMPLANT
DRAPE LAPAROTOMY 100X72 PEDS (DRAPES) ×2 IMPLANT
DRAPE UTILITY XL STRL (DRAPES) ×2 IMPLANT
ELECT COATED BLADE 2.86 ST (ELECTRODE) ×2 IMPLANT
ELECT REM PT RETURN 9FT ADLT (ELECTROSURGICAL) ×2
ELECTRODE REM PT RTRN 9FT ADLT (ELECTROSURGICAL) ×1 IMPLANT
GLOVE ECLIPSE 8.0 STRL XLNG CF (GLOVE) ×2 IMPLANT
GLOVE SRG 8 PF TXTR STRL LF DI (GLOVE) ×1 IMPLANT
GLOVE SURG UNDER POLY LF SZ8 (GLOVE) ×2
GOWN STRL REUS W/ TWL LRG LVL3 (GOWN DISPOSABLE) ×2 IMPLANT
GOWN STRL REUS W/ TWL XL LVL3 (GOWN DISPOSABLE) ×1 IMPLANT
GOWN STRL REUS W/TWL LRG LVL3 (GOWN DISPOSABLE) ×4
GOWN STRL REUS W/TWL XL LVL3 (GOWN DISPOSABLE) ×2
HEMOSTAT ARISTA ABSORB 3G PWDR (HEMOSTASIS) IMPLANT
HEMOSTAT SNOW SURGICEL 2X4 (HEMOSTASIS) IMPLANT
KIT MARKER MARGIN INK (KITS) ×2 IMPLANT
NDL HYPO 25X1 1.5 SAFETY (NEEDLE) ×1 IMPLANT
NEEDLE HYPO 25X1 1.5 SAFETY (NEEDLE) ×2 IMPLANT
NS IRRIG 1000ML POUR BTL (IV SOLUTION) ×2 IMPLANT
PACK BASIN DAY SURGERY FS (CUSTOM PROCEDURE TRAY) ×2 IMPLANT
PENCIL SMOKE EVACUATOR (MISCELLANEOUS) ×2 IMPLANT
SLEEVE SCD COMPRESS KNEE MED (MISCELLANEOUS) ×2 IMPLANT
SPONGE LAP 4X18 RFD (DISPOSABLE) ×4 IMPLANT
SUT MNCRL AB 4-0 PS2 18 (SUTURE) ×2 IMPLANT
SUT SILK 2 0 SH (SUTURE) IMPLANT
SUT VICRYL 3-0 CR8 SH (SUTURE) ×2 IMPLANT
SYR BULB IRRIG 60ML STRL (SYRINGE) ×1 IMPLANT
SYR CONTROL 10ML LL (SYRINGE) ×2 IMPLANT
TOWEL GREEN STERILE FF (TOWEL DISPOSABLE) ×2 IMPLANT
TRAY FAXITRON CT DISP (TRAY / TRAY PROCEDURE) ×1 IMPLANT
TUBE CONNECTING 20X1/4 (TUBING) ×1 IMPLANT
YANKAUER SUCT BULB TIP NO VENT (SUCTIONS) ×1 IMPLANT

## 2020-06-28 NOTE — H&P (Signed)
Debbie [name]  Location: [facility] Patient #: [id number] DOB: [birth year] Married / Language: English / Race: White Female  History of Present Illness  Patient words: 50 y.o. 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 Jana Hakim after magnetic resonance imaging showed an area of density in the left breast core biopsy proven to be consistent with fibrocystic change but there was a fragment of a papilloma in the specimen. She is high risk and is already had bilateral oophorectomy. She is now being maintained on anastrozole. She presents for discussion of LUMPECTOMY in the setting of probable papilloma This was only a fragment of a papilloma so it's difficult to discern from pathology. She has no history of nipple discharge or other symptom.  (1) ovarian cancer risk: (a) status post bilateral salpingo-oophorectomy 07/13/2019 with benign pathology  (2) breast cancer risk: (a) risk reduction with aromatase inhibitor: to start anastrozole (b) intensified screening  (3) breast MRI biopsy 04/05/2020 shows possible papilloma  (4) t anastrozole 04/16/2020       High risk screening. Patient is asymptomatic. No previous breast surgeries.  LABS: None.  EXAM: BILATERAL BREAST MRI WITH AND WITHOUT CONTRAST  TECHNIQUE: Multiplanar, multisequence MR images of both breasts were obtained prior to and following the intravenous administration of 7 ml of Gadavist  Three-dimensional MR images were rendered by post-processing of the original MR data on an independent workstation. The three-dimensional MR images were interpreted, and findings are reported in the following complete MRI report for this study. Three dimensional images were evaluated at the independent interpreting workstation using the DynaCAD thin client.  COMPARISON: None.  FINDINGS: Breast composition: c.  Heterogeneous fibroglandular tissue.  Background parenchymal enhancement: Mild  Right breast: There is a small amount of clumped non mass enhancement in the lower central right breast (series 10, image 115) measuring approximately 1.2 x 0.7 cm. No additional abnormal areas of enhancement in the right breast.  Left breast: There is a 0.5 cm enhancing mass with suggestion of an hilum in the outer left breast (series 10, image 89) which demonstrates T2 hyperintensity. This likely corresponds to a small mass in the outer left breast seen mammographically which has been stable dating back to November 2019, consistent with a benign finding. There is linear non mass enhancement in the retroareolar left breast middle depth spanning approximately 1.9 cm (series 10, image 67). No additional suspicious areas of enhancement in the left breast.  Lymph nodes: No abnormal appearing lymph nodes.  Ancillary findings: None.  IMPRESSION: 1. Small amount of clumped non mass enhancement in the lower central RIGHT breast measuring 1.2. 2. Linear non mass enhancement in the retroareolar LEFT breast spanning approximately 1.9 cm.  RECOMMENDATION: Bilateral MRI guided biopsy of the non mass enhancement in each breast.  BI-RADS CATEGORY 4: Suspicious.   Electronically Signed By: [name] M.D. On: 03/25/2020 12:52           Diagnosis Breast, left, needle core biopsy, central left - MICROSCOPIC DETACHED FRAGMENTS SUSPICIOUS FOR PAPILLOMA. - PSEUDOANGIOMATOUS STROMAL HYPERPLASIA ([place]). - FIBROCYSTIC CHANGE WITH APOCRINE METAPLASIA. - SEE MICROSCOPIC DESCRIPTION.  The patient is a 50 year old female.   Past Surgical History Breast Biopsy Left. Gallbladder Surgery - Laparoscopic Hysterectomy (not due to cancer) - Complete  Diagnostic Studies History  Colonoscopy 5-10 years ago Mammogram within last year Pap Smear 1-5 years  ago  Allergies  Allergies Reconciled  Medication History  Anastrozole (1MG Tablet, Oral) Active. Esomeprazole  Magnesium (40MG Capsule DR, Oral) Active. Hydroxychloroquine Sulfate (200MG Tablet, Oral) Active. Vitamin B Complex (1 (one) Oral) Active. Vitamin C (1 (one) Oral) Specific strength unknown - Active. Vitamin D (1 (one) Oral) Specific strength unknown - Active. Flonase (Nasal) Specific strength unknown - Active. Multiple Vitamin (1 (one) Oral) Active. Ergocalciferol (50 MCG(2000 UT) Capsule, Oral) Active. Glycopyrrolate (2MG Tablet, Oral) Active. Systane (0.4-0.3% Solution, Ophthalmic) Active. ZyrTEC Allergy (Oral) Specific strength unknown - Active. Medications Reconciled  Social History  Alcohol use Occasional alcohol use. Caffeine use Coffee. No drug use Tobacco use Never smoker.  Family History  Arthritis Daughter, Mother, Sister. Diabetes Mellitus Father. Hypertension Mother, Sister.  Pregnancy / Birth History  Age at menarche 49 years. Age of menopause <46 Contraceptive History Depo-provera, Oral contraceptives. Gravida 2 Irregular periods Length (months) of breastfeeding 7-12 Maternal age 29-25 Para 2  Other Problems  Arthritis Cholelithiasis Gastroesophageal Reflux Disease General anesthesia - complications Kidney Stone Oophorectomy     Review of Systems  AM) General Present- Fatigue and Weight Gain. Not Present- Appetite Loss, Chills, Fever, Night Sweats and Weight Loss. Skin Present- Non-Healing Wounds. Not Present- Change in Wart/Mole, Dryness, Hives, Jaundice, New Lesions, Rash and Ulcer. HEENT Present- Seasonal Allergies and Wears glasses/contact lenses. Not Present- Earache, Hearing Loss, Hoarseness, Nose Bleed, Oral Ulcers, Ringing in the Ears, Sinus Pain, Sore Throat, Visual Disturbances and Yellow Eyes. Respiratory Present- Snoring. Not Present- Bloody sputum, Chronic Cough, Difficulty  Breathing and Wheezing. Cardiovascular Present- Rapid Heart Rate and Swelling of Extremities. Not Present- Chest Pain, Difficulty Breathing Lying Down, Leg Cramps, Palpitations and Shortness of Breath. Female Genitourinary Present- Frequency. Not Present- Nocturia, Painful Urination, Pelvic Pain and Urgency. Musculoskeletal Present- Joint Stiffness and Swelling of Extremities. Not Present- Back Pain, Joint Pain, Muscle Pain and Muscle Weakness. Endocrine Present- Cold Intolerance and Hot flashes. Not Present- Excessive Hunger, Hair Changes, Heat Intolerance and New Diabetes. Hematology Present- Easy Bruising. Not Present- Blood Thinners, Excessive bleeding, Gland problems, HIV and Persistent Infections.  Vitals  05/14/2020 10:35 AM Weight: 169.13 lb Height: 61in Body Surface Area: 1.76 m Body Mass Index: 31.96 kg/m  Temp.: 98.45F  Pulse: 79 (Regular)  P.OX: 99% (Room air) BP: 120/88(Sitting, Left Arm, Standard)        Physical Exam   General Mental Status-Alert. General Appearance-Consistent with stated age. Hydration-Well hydrated. Voice-Normal.  Head and Neck Head-normocephalic, atraumatic with no lesions or palpable masses. Trachea-midline. Thyroid Gland Characteristics - normal size and consistency.  Eye Eyeball - Bilateral-Extraocular movements intact. Sclera/Conjunctiva - Bilateral-No scleral icterus.  Chest and Lung Exam Chest and lung exam reveals -quiet, even and easy respiratory effort with no use of accessory muscles and on auscultation, normal breath sounds, no adventitious sounds and normal vocal resonance. Inspection Chest Wall - Normal. Back - normal.  Breast Breast - Left-Symmetric, Non Tender, No Biopsy scars, no Dimpling - Left, No Inflammation, No Lumpectomy scars, No Mastectomy scars, No Peau d' Orange. Breast - Right-Symmetric, Non Tender, No Biopsy scars, no Dimpling - Right, No Inflammation, No  Lumpectomy scars, No Mastectomy scars, No Peau d' Orange. Breast Lump-No Palpable Breast Mass.  Cardiovascular Cardiovascular examination reveals -normal heart sounds, regular rate and rhythm with no murmurs and normal pedal pulses bilaterally.  Abdomen Inspection Inspection of the abdomen reveals - No Hernias. Skin - Scar - no surgical scars. Palpation/Percussion Palpation and Percussion of the abdomen reveal - Soft, Non Tender, No Rebound tenderness, No Rigidity (guarding) and No hepatosplenomegaly. Auscultation Auscultation of the abdomen reveals - Bowel sounds  normal.  Neurologic Neurologic evaluation reveals -alert and oriented x 3 with no impairment of recent or remote memory. Mental Status-Normal.  Musculoskeletal Normal Exam - Left-Upper Extremity Strength Normal and Lower Extremity Strength Normal. Normal Exam - Right-Upper Extremity Strength Normal and Lower Extremity Strength Normal.  Lymphatic Head & Neck  General Head & Neck Lymphatics: Bilateral - Description - Normal. Axillary  General Axillary Region: Bilateral - Description - Normal. Tenderness - Non Tender. Femoral & Inguinal  Generalized Femoral & Inguinal Lymphatics: Bilateral - Description - Normal. Tenderness - Non Tender.    Assessment & Plan    PAPILLOMA OF LEFT BREAST (D24.2) Impression: Discussed the pros and cons of lumpectomy in her case. With her high-risk state, I recommend left breast lumpectomy since is not clear exactly if this is a papilloma or fragment and 1. I discussed potential upgrade risk of up to 10% given her high risk I think this would be the best course of action for her. I also discussed observation with repeat imaging as an alternative to surgery. After weighing her options she opted for left breast seed lumpectomy for left breast papilloma Risk of lumpectomy include bleeding, infection, seroma, more surgery, use of seed/wire, wound care, cosmetic deformity  and the need for other treatments, poor wound healing death , blood clots, death. Pt agrees to proceed.  Total time 45 minutes   AT HIGH RISK FOR BREAST CANCER (Z91.89)  Current Plans You are being scheduled for surgery- Our schedulers will call you.  You should hear from our office's scheduling department within 5 working days about the location, date, and time of surgery. We try to make accommodations for patient's preferences in scheduling surgery, but sometimes the OR schedule or the surgeon's schedule prevents [place] from making those accommodations.  If you have not heard from our office [phone]) in 5 working days, call the office and ask for your surgeon's nurse.  If you have other questions about your diagnosis, plan, or surgery, call the office and ask for your surgeon's nurse.  Pt Education - [facility] Breast Biopsy HCI: discussed with patient and provided information.

## 2020-06-28 NOTE — Transfer of Care (Signed)
Immediate Anesthesia Transfer of Care Note  Patient: Debbie [name]  Procedure(s) Performed: LEFT BREAST LUMPECTOMY WITH RADIOACTIVE SEED LOCALIZATION (Left Breast)  Patient Location: PACU  Anesthesia Type:General  Level of Consciousness: drowsy and patient cooperative  Airway & Oxygen Therapy: Patient Spontanous Breathing and Patient connected to face mask oxygen  Post-op Assessment: Report given to RN and Post -op Vital signs reviewed and stable  Post vital signs: Reviewed and stable  Last Vitals:  Vitals Value Taken Time  BP    Temp    Pulse 93 06/28/20 0857  Resp 17 06/28/20 0857  SpO2 100 % 06/28/20 0857  Vitals shown include unvalidated device data.  Last Pain:  Vitals:   06/28/20 0652  TempSrc: Oral  PainSc: 6       Patients Stated Pain Goal: 8 (28/36/62 9476)  Complications: No complications documented.

## 2020-06-28 NOTE — Anesthesia Postprocedure Evaluation (Signed)
Anesthesia Post Note  Patient: Debbie [name]  Procedure(s) Performed: LEFT BREAST LUMPECTOMY WITH RADIOACTIVE SEED LOCALIZATION (Left Breast)     Patient location during evaluation: Phase II Anesthesia Type: General Level of consciousness: awake Pain management: pain level controlled Vital Signs Assessment: post-procedure vital signs reviewed and stable Respiratory status: spontaneous breathing Cardiovascular status: stable Postop Assessment: no apparent nausea or vomiting Anesthetic complications: no   No complications documented.  Last Vitals:  Vitals:   06/28/20 0930 06/28/20 1006  BP: 123/81 118/61  Pulse: 64 75  Resp: 13 14  Temp:  36.7 C  SpO2: 99% 99%    Last Pain:  Vitals:   06/28/20 1006  TempSrc:   PainSc: [name]

## 2020-06-28 NOTE — Anesthesia Procedure Notes (Signed)
Procedure Name: LMA Insertion Date/Time: 06/28/2020 7:41 AM Performed by: Glory Buff, CRNA Pre-anesthesia Checklist: Patient identified, Emergency Drugs available, Suction available and Patient being monitored Patient Re-evaluated:Patient Re-evaluated prior to induction Oxygen Delivery Method: Circle system utilized Preoxygenation: Pre-oxygenation with 100% oxygen Induction Type: IV induction Ventilation: Mask ventilation without difficulty LMA: LMA inserted LMA Size: 4.0 Number of attempts: 1 Placement Confirmation: positive ETCO2 Tube secured with: Tape Dental Injury: Teeth and Oropharynx as per pre-operative assessment

## 2020-06-28 NOTE — Discharge Instructions (Signed)
[facility] °Office Phone Number [phone] ° °BREAST BIOPSY/ PARTIAL MASTECTOMY: POST OP INSTRUCTIONS ° °Always review your discharge instruction sheet given to you by the facility where your surgery was performed. ° °IF YOU HAVE DISABILITY OR FAMILY LEAVE FORMS, YOU MUST BRING THEM TO THE OFFICE FOR PROCESSING.  DO NOT GIVE THEM TO YOUR DOCTOR. ° °1. A prescription for pain medication may be given to you upon discharge.  Take your pain medication as prescribed, if needed.  If narcotic pain medicine is not needed, then you may take acetaminophen (Tylenol) or ibuprofen (Advil) as needed. °2. Take your usually prescribed medications unless otherwise directed °3. If you need a refill on your pain medication, please contact your pharmacy.  They will contact our office to request authorization.  Prescriptions will not be filled after 5pm or on week-ends. °4. You should eat very light the first 24 hours after surgery, such as soup, crackers, pudding, etc.  Resume your normal diet the day after surgery. °5. Most patients will experience some swelling and bruising in the breast.  Ice packs and a good support bra will help.  Swelling and bruising can take several days to resolve.  °6. It is common to experience some constipation if taking pain medication after surgery.  Increasing fluid intake and taking a stool softener will usually help or prevent this problem from occurring.  A mild laxative (Milk of Magnesia or Miralax) should be taken according to package directions if there are no bowel movements after 48 hours. °7. Unless discharge instructions indicate otherwise, you may remove your bandages 24-48 hours after surgery, and you may shower at that time.  You may have steri-strips (small skin tapes) in place directly over the incision.  These strips should be left on the skin for 7-10 days.  If your surgeon used skin glue on the incision, you may shower in 24 hours.  The glue will flake off over the  next 2-3 weeks.  Any sutures or staples will be removed at the office during your follow-up visit. °8. ACTIVITIES:  You may resume regular daily activities (gradually increasing) beginning the next day.  Wearing a good support bra or sports bra minimizes pain and swelling.  You may have sexual intercourse when it is comfortable. °a. You may drive when you no longer are taking prescription pain medication, you can comfortably wear a seatbelt, and you can safely maneuver your car and apply brakes. °b. RETURN TO WORK:  ______________________________________________________________________________________ °9. You should see your doctor in the office for a follow-up appointment approximately two weeks after your surgery.  Your doctor’s nurse will typically make your follow-up appointment when she calls you with your pathology report.  Expect your pathology report 2-3 business days after your surgery.  You may call to check if you do not hear from us after three days. °10. OTHER INSTRUCTIONS: _______________________________________________________________________________________________ _____________________________________________________________________________________________________________________________________ °_____________________________________________________________________________________________________________________________________ °_____________________________________________________________________________________________________________________________________ ° °WHEN TO CALL YOUR DOCTOR: °1. Fever over 101.0 °2. Nausea and/or vomiting. °3. Extreme swelling or bruising. °4. Continued bleeding from incision. °5. Increased pain, redness, or drainage from the incision. ° °The clinic staff is available to answer your questions during regular business hours.  Please don’t hesitate to call and ask to speak to one of the nurses for clinical concerns.  If you have a medical emergency, go to the nearest  emergency room or call 911.  A surgeon from [facility] is always on call at the hospital. ° °For further questions, please visit [email or web address]  ° ° ° ° °  Post Anesthesia Home Care Instructions ° °Activity: °Get plenty of rest for the remainder of the day. A responsible individual must stay with you for 24 hours following the procedure.  °For the next 24 hours, DO NOT: °-Drive a car °-Operate machinery °-Drink alcoholic beverages °-Take any medication unless instructed by your physician °-Make any legal decisions or sign important papers. ° °Meals: °Start with liquid foods such as gelatin or soup. Progress to regular foods as tolerated. Avoid greasy, spicy, heavy foods. If nausea and/or vomiting occur, drink only clear liquids until the nausea and/or vomiting subsides. Call your physician if vomiting continues. ° °Special Instructions/Symptoms: °Your throat may feel dry or sore from the anesthesia or the breathing tube placed in your throat during surgery. If this causes discomfort, gargle with warm salt water. The discomfort should disappear within 24 hours. ° °If you had a scopolamine patch placed behind your ear for the management of post- operative nausea and/or vomiting: ° °1. The medication in the patch is effective for 72 hours, after which it should be removed.  Wrap patch in a tissue and discard in the trash. Wash hands thoroughly with soap and water. °2. You may remove the patch earlier than 72 hours if you experience unpleasant side effects which may include dry mouth, dizziness or visual disturbances. °3. Avoid touching the patch. Wash your hands with soap and water after contact with the patch. °  ° °

## 2020-06-28 NOTE — Interval H&P Note (Signed)
History and Physical Interval Note:  06/28/2020 7:28 AM  Debbie [name]  has presented today for surgery, with the diagnosis of PAPILLOMA LEFT BREAST.  The various methods of treatment have been discussed with the patient and family. After consideration of risks, benefits and other options for treatment, the patient has consented to  Procedure(s): LEFT BREAST LUMPECTOMY WITH RADIOACTIVE SEED LOCALIZATION (Left) as a surgical intervention.  The patient's history has been reviewed, patient examined, no change in status, stable for surgery.  I have reviewed the patient's chart and labs.  Questions were answered to the patient's satisfaction.     [place]

## 2020-06-28 NOTE — Op Note (Signed)
Preoperative diagnosis: Left breast papilloma  Postoperative diagnosis: Same   Procedure: Left breast seed localized lumpectomy  Surgeon: Erroll Luna M.D.  Anesthesia: Gen. With 0.25% Sensorcaine local  EBL: 20 cc  Specimen: Left breast tissue with clip and radioactive seed in the specimen. Verified with neoprobe and radiographic image showing both seed and clip in specimen  Indications for procedure: The patient presents for left breast excisional lumpectomy after core biopsy showed papilloma. Discussed the rationale for considering excision. Small risk of malignancy associated with papilloma lesion after core biopsy. Discussed observation. Discussed wire localization. Patient desired excision of left breast papilloma.The procedure has been discussed with the patient. Alternatives to surgery have been discussed with the patient.  Risks of surgery include bleeding,  Infection,  Seroma formation, death,  and the need for further surgery.   The patient understands and wishes to proceed.   Description of procedure: Patient underwent seed placement as an outpatient. Patient presents today for left breast seed localized lumpectomy. Patient and holding area. Questions are answered and neoprobe used to verify seed location. Patient taken back to the operating room and placed upon the OR table. After induction of general anesthesia, left breast prepped and draped in a sterile fashion. Timeout was done to verify proper sizing procedure. Neoprobe used and hot spot identified and left breast central.  This was marked with pen. Curvilinear incision made left medial NAC . Dissection used with the help of a neoprobe around the tissue where the seed and clip were located. Tissue removed in its entirety with gross margins.. Neoprobe used and seed within specimen. Radiographs taken which showed the  seed  In specimen.The clip was not present.   All margins were excised and clip still was not present. I suspect  the clip was displaced from the area when dissecting it out.  It may have been suctioned out during  the procedure as well.   Given the wide excision, no further attempt to search for the clip was done  To reduce adjacent trauma to the breast.  Hemostasis achieved  With cautery and wound irrigated with antibiotic solution.   The  C[place]ity was  closed with 3-0 Vicryl and 4-0 Monocryl. Dermabond applied. All final counts found to be correct. Specimen transported to pathology. Patient awoke extubated taken to recovery in satisfactory condition.

## 2020-06-29 ENCOUNTER — Encounter (HOSPITAL_BASED_OUTPATIENT_CLINIC_OR_DEPARTMENT_OTHER): Payer: Self-pay | Admitting: Surgery

## 2020-07-02 LAB — SURGICAL PATHOLOGY

## 2020-07-04 ENCOUNTER — Telehealth (HOSPITAL_BASED_OUTPATIENT_CLINIC_OR_DEPARTMENT_OTHER): Payer: BC Managed Care – PPO | Admitting: Oncology

## 2020-07-04 ENCOUNTER — Encounter: Payer: Self-pay | Admitting: Oncology

## 2020-07-04 DIAGNOSIS — M0579 Rheumatoid arthritis with rheumatoid factor of multiple sites without organ or systems involvement: Secondary | ICD-10-CM

## 2020-07-04 DIAGNOSIS — Z9189 Other specified personal risk factors, not elsewhere classified: Secondary | ICD-10-CM

## 2020-07-04 DIAGNOSIS — M320 Drug-induced systemic lupus erythematosus: Secondary | ICD-10-CM

## 2020-07-04 DIAGNOSIS — Z1239 Encounter for other screening for malignant neoplasm of breast: Secondary | ICD-10-CM

## 2020-07-04 NOTE — Progress Notes (Signed)
[facility]  [phone] [phone]     ID: Debbie [name] DOB: [birth year]  MR#: [id number]  BZJ#:[id number]  Patient Care Team: Mateo Flow, MD as PCP - General (Family Medicine) Elouise Munroe, MD as Consulting Physician (Cardiology) Tat, Eustace Quail, DO as Consulting Physician (Neurology) Arnulfo Batson, Virgie Dad, MD as Consulting Physician (Oncology) Molli Posey, MD as Consulting Physician (Obstetrics and Gynecology) Simon Rhein, MD as Resident (Neurology) Erroll Luna, MD as Consulting Physician (General Surgery) Chesley Mires, MD as Consulting Physician (Pulmonary Disease) Chauncey Cruel, MD OTHER MD:  I attempted to connect with Debbie [name] on 07/04/20 at  3:30 PM EST by telephone visit however there was no answer, there was no video and I left a voicemail  CHIEF COMPLAINT: 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  However there was no video connection and there was no answer to her phone    Since her last visit, she underwent left lumpectomy on 06/28/2020. Pathology from the procedure ([id number]) showed: scant residual intraductal papilloma; negative for in situ or invasive carcinoma.   REVIEW OF SYSTEMS: Debbie [name]    COVID 19 VACCINATION STATUS: Second Pfizer vaccine October 2021   HISTORY OF CURRENT ILLNESS: From the original intake note:  Debbie [name] has a family history of cancer in her maternal grandfather. He was diagnosed with metastatic cancer involving lung pancreas and colon.  It is not clear what the primary was.  He was 75 at the time of death..  With this background when Debbie [name] most recently saw Dr. 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  The patient's  subsequent history is as detailed below.   PAST MEDICAL HISTORY: Past Medical History:  Diagnosis Date  . Allergy   . Anemia   . Anorexia nervosa with bulimia 2004  . Arthritis    ra  . Gastroparesis   . GERD (gastroesophageal reflux disease)   . History of kidney stones   . Lupus ([place])   . Menopause   . Pain and swelling of right forearm    resolved  . POTS (postural orthostatic tachycardia syndrome)    ?pots per 08-02-2018 cardiology note folllow up prn  . Skin abnormalities 07/07/2019   right leg above knee pinhole area had fro months scabs over, will bleed if scraped  . SLE (systemic lupus erythematosus) ([place]) 08/29/2016  POTS syndrome   PAST SURGICAL HISTORY: Past Surgical History:  Procedure Laterality Date  . ABDOMINAL HYSTERECTOMY     2x,2008-2009 partial  . BREAST LUMPECTOMY WITH RADIOACTIVE SEED LOCALIZATION Left 06/28/2020   Procedure: LEFT BREAST LUMPECTOMY WITH RADIOACTIVE SEED LOCALIZATION;  Surgeon: Erroll Luna, MD;  Location: [facility];  Service: General;  Laterality: Left;  . CARPAL TUNNEL RELEASE Bilateral   . CHOLECYSTECTOMY    . CYSTOSCOPY Bilateral 07/13/2019   Procedure: CYSTOSCOPY;  Surgeon: Molli Posey, MD;  Location: [facility];  Service: Gynecology;  Laterality: Bilateral;  . CYSTOSCOPY WITH RETROGRADE PYELOGRAM, URETEROSCOPY AND STENT PLACEMENT Left 06/22/2015   Procedure: LEFT URETEROSCOPY AND STONE EXTRACTION ;  Surgeon: Kathie Rhodes, MD;  Location: [facility];  Service: Urology;  Laterality: Left;  . KIDNEY STONE SURGERY     5  . LAPAROSCOPIC BILATERAL SALPINGO OOPHERECTOMY Bilateral 07/13/2019   Procedure: LAPAROSCOPIC  BILATERAL SALPINGO OOPHORECTOMY;  Surgeon: Molli Posey, MD;  Location: [facility];  Service: Gynecology;  Laterality: Bilateral;  . OVARIAN CYST SURGERY  2007   LAP    FAMILY HISTORY: Family History  Problem Relation Age of Onset  . Arthritis Mother        rheumatoid    . Hernia Mother   . Hypertension Mother   . Heart attack Mother   . Diabetes Father   . Lupus Sister   . Diabetes Maternal Grandmother   . Cancer Maternal Grandfather   . Pancreatic cancer Maternal Grandfather 8  . Lung cancer Maternal Grandfather 74  . Colon cancer Maternal Grandfather 65       colorectal  . Diabetes Paternal Grandfather   . Heart disease Paternal Grandfather   . Rheum arthritis Daughter   . Hashimoto's thyroiditis Daughter   . Heart disease Paternal Grandmother   . Diabetes Other   [name] died at age 5 from complications of diabetes.  His parents had no history of cancer.  [name] had 2 sisters and 3 brothers, with no cancer in any of those siblings.  The patient's mother is 62 years old as of December 2020.  Her father had metastatic cancer but the patient does not know what the primary was.  The maternal grandmother did not have cancer and neither did this single maternal aunt.   GYNECOLOGIC HISTORY:  No LMP recorded. Patient has had a hysterectomy. Menarche: 50 years old Age at first live birth: 50 years old Debbie [name] P 2 LMP age 54 (with hysterectomy HRT estrogen alone Hysterectomy? Yes, at age 15 BSO? 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  Her husband Debbie [name] works for Marketing executive.  There is son Debbie [name], 44 as of December 2020 lives in [place] and works Biomedical engineer towers.  Daughter Debbie [name], 21, is a Development worker, community.  The patient has no grandchildren.  She attends a [employer] DIRECTIVES: In the absence of any documents to the contrary the patient's husband is her healthcare power of attorney   HEALTH MAINTENANCE: Social History   Tobacco Use  . Smoking status: Never Smoker  . Smokeless tobacco: Never Used  Vaping Use  . Vaping Use: Never used  Substance Use Topics  . Alcohol use: No    Comment: rarely   . Drug use: No      Colonoscopy: 2016 (Dr. Oletta [name])  PAP: 01/2016, negative  Bone density: Never   Allergies  Allergen Reactions  . Zofran [Ondansetron Hcl] Other (See Comments)    Stutters and looses control of her arm and legs  . Metoclopramide     "stutters and loses control of arms of legs"    Current Outpatient Medications  Medication Sig Dispense Refill  . anastrozole (ARIMIDEX) 1 MG tablet Take 1 tablet (1 mg total) by mouth daily. 90 tablet 4  . Ascorbic Acid (VITAMIN C PO) Take by mouth.    . cetirizine (ZYRTEC) 10 MG tablet Take 10 mg by mouth daily as needed for allergies.     . Cyanocobalamin (VITAMIN B-12 PO) Take by mouth.    . Esomeprazole Magnesium (NEXIUM PO) Take 40 mg by mouth daily.     . Fluticasone Propionate (FLONASE ALLERGY RELIEF NA) Place into the nose as needed.     . hydroxychloroquine (PLAQUENIL) 200 MG tablet TAKE 1 TABLET EACH MORNING AND 1/2 TABLET  AT BEDTIME 135 tablet 0  . ibuprofen (ADVIL) 800 MG tablet Take 1 tablet (800 mg total) by mouth every 8 (eight) hours as needed. 30 tablet 0  . Multiple Vitamins-Minerals (MULTIVITAMIN ADULT PO) Take by mouth daily.    . Oral Electrolytes (THERMOTABS PO) Take 1 tablet by mouth daily.    Marland Kitchen oxyCODONE (OXY IR/ROXICODONE) 5 MG immediate release tablet Take 1 tablet (5 mg total) by mouth every 6 (six) hours as needed for severe pain. 15 tablet 0  . polyethylene glycol (MIRALAX / GLYCOLAX) packet Take 17 g by mouth 3 (three) times daily.     . promethazine (PHENERGAN) 25 MG tablet Take 25 mg by mouth every 6 (six) hours as needed for nausea or vomiting.    Marland Kitchen VITAMIN D PO Take by mouth.     No current facility-administered medications for this visit.    OBJECTIVE:   There were no vitals filed for this visit.   There is no height or weight on file to calculate BMI.   Wt Readings from Last 3 Encounters:  06/28/20 166 lb 10.7 oz (75.6 kg)  02/27/20 164 lb (74.4 kg)  02/13/20 164 lb 3.2 oz (74.5 kg)      ECOG FS:1 -  Symptomatic but completely ambulatory  Telemedicine visit 07/04/2020  LAB RESULTS:  CMP     Component Value Date/Time   NA 140 06/25/2020 1406   K 4.4 06/25/2020 1406   CL 105 06/25/2020 1406   CO2 24 06/25/2020 1406   GLUCOSE 89 06/25/2020 1406   BUN 11 06/25/2020 1406   CREATININE 0.85 06/25/2020 1406   CREATININE 0.68 03/02/2018 1610   CALCIUM 9.7 06/25/2020 1406   PROT 7.0 06/25/2020 1406   ALBUMIN 3.9 06/25/2020 1406   AST 43 (H) 06/25/2020 1406   ALT 75 (H) 06/25/2020 1406   ALKPHOS 133 (H) 06/25/2020 1406   BILITOT 0.4 06/25/2020 1406   GFRNONAA >60 06/25/2020 1406   GFRNONAA 106 03/02/2018 1610   GFRAA 122 03/02/2018 1610    No results found for: TOTALPROTELP, ALBUMINELP, A1GS, A2GS, BETS, BETA2SER, GAMS, MSPIKE, SPEI  No results found for: KPAFRELGTCHN, LAMBDASER, KAPLAMBRATIO  Lab Results  Component Value Date   WBC 7.3 06/25/2020   NEUTROABS 3.7 06/25/2020   HGB 13.3 06/25/2020   HCT 39.1 06/25/2020   MCV 85.2 06/25/2020   PLT 370 06/25/2020   No results found for: LABCA2  No components found for: ZRAQTM226  No results for input(s): INR in the last 168 hours.  No results found for: LABCA2  No results found for: JFH545  No results found for: GYB638  No results found for: LHT342  No results found for: CA2729  No components found for: HGQUANT  No results found for: CEA1 / No results found for: CEA1   No results found for: AFPTUMOR  No results found for: CHROMOGRNA  No results found for: HGBA, HGBA2QUANT, HGBFQUANT, HGBSQUAN (Hemoglobinopathy evaluation)   No results found for: LDH  No results found for: IRON, TIBC, IRONPCTSAT (Iron and TIBC)  No results found for: FERRITIN  Urinalysis    Component Value Date/Time   COLORURINE YELLOW 03/02/2016 0132   APPEARANCEUR CLEAR 03/02/2016 0132   LABSPEC 1.002 (L) 03/02/2016 0132   PHURINE 7.0 03/02/2016 0132   GLUCOSEU NEGATIVE 03/02/2016 0132   HGBUR NEGATIVE 03/02/2016 0132    BILIRUBINUR NEGATIVE 03/02/2016 0132   BILIRUBINUR NEG 10/11/2012 1728   KETONESUR NEGATIVE 03/02/2016 0132   PROTEINUR NEGATIVE 03/02/2016 0132   UROBILINOGEN  0.2 10/12/2012 1417   NITRITE NEGATIVE 03/02/2016 0132   LEUKOCYTESUR NEGATIVE 03/02/2016 0132    STUDIES: MM Breast Surgical Specimen  Result Date: 06/28/2020 CLINICAL DATA:  Specimen radiograph status post excisional biopsy of the left breast. EXAM: SPECIMEN RADIOGRAPH OF THE LEFT BREAST COMPARISON:  Previous exam(s). FINDINGS: Status post excision of the left breast. The radioactive seed is present and completely intact within the specimen. The biopsy marking clip is not seen within the specimen. This was communicated to the OR at 8:35 a.m. Additional margins were taken however the clip was still not located. IMPRESSION: Specimen radiograph of the left breast. Electronically Signed   By: Ammie Ferrier M.D.   On: 06/28/2020 08:37   MM LT RADIOACTIVE SEED LOC MAMMO GUIDE  Result Date: 06/27/2020 CLINICAL DATA:  50 year old female for radioactive seed localization of LEFT breast papilloma prior to excision. EXAM: MAMMOGRAPHIC GUIDED RADIOACTIVE SEED LOCALIZATION OF THE LEFT BREAST COMPARISON:  Previous exam(s). FINDINGS: Patient presents for radioactive seed localization prior to LEFT breast excision. I met with the patient and we discussed the procedure of seed localization including benefits and alternatives. We discussed the high likelihood of a successful procedure. We discussed the risks of the procedure including infection, bleeding, tissue injury and further surgery. We discussed the low dose of radioactivity involved in the procedure. Informed, written consent was given. The usual time-out protocol was performed immediately prior to the procedure. Using mammographic guidance, sterile technique, 1% lidocaine and an I-125 radioactive seed, the dumbbell shaped biopsy clip was localized using a SUPERIOR approach. The follow-up mammogram  images confirm the seed in the expected location and were marked for Dr. Brantley Stage. Follow-up survey of the patient confirms presence of the radioactive seed. Order number of I-125 seed:  [id number]. Total activity:  2.563 millicuries.  Reference Date: 06/22/2020 The patient tolerated the procedure well and was released from the [facility]. She was given instructions regarding seed removal. IMPRESSION: Radioactive seed localization LEFT breast. No apparent complications. Electronically Signed   By: Margarette Canada M.D.   On: 06/27/2020 15:24     ELIGIBLE FOR AVAILABLE RESEARCH PROTOCOL: no  ASSESSMENT: 50 y.o. 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  A follow-up letter has been sent   Chauncey Cruel, MD   07/04/2020 3:31 PM Medical Oncology and Hematology [facility] [place], [place] [address] Tel. [phone]    Fax. [phone]   I, Wilburn Mylar, am acting as scribe for Dr. Virgie Dad. Jeaneen Cala.  I, Lurline Del MD, have reviewed the above documentation for accuracy and completeness, and I agree with the above.   *Total Encounter Time as defined by the Centers for Medicare and Medicaid Services includes, in addition to the face-to-face time of a patient visit (documented in the note above) non-face-to-face time: obtaining and reviewing outside history, ordering and reviewing medications, tests or procedures, care coordination  (communications with other health care professionals or caregivers) and documentation in the medical record.

## 2020-10-08 ENCOUNTER — Telehealth: Payer: Self-pay | Admitting: Pulmonary Disease

## 2020-10-08 NOTE — Telephone Encounter (Signed)
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  Pt has no symptoms and her home test has been negative.  I advised her to go get a pcr test done and she is going to set that up for Wednesday.    Her question is, if she tests negative for covid on Wednesday, can she go ahead and get her booster that day?  VS please advise. Thanks

## 2020-10-09 ENCOUNTER — Encounter: Payer: Self-pay | Admitting: *Deleted

## 2020-10-09 NOTE — Telephone Encounter (Signed)
I have sent a my chart message to the pt to make her aware of VS recs.  Nothing further is needed.

## 2020-10-09 NOTE — Telephone Encounter (Signed)
If she tests negative and has no symptoms, then it is okay for her to get COVID 19 booster vaccine.

## 2020-11-13 ENCOUNTER — Other Ambulatory Visit: Payer: Self-pay

## 2020-11-13 ENCOUNTER — Encounter: Payer: Self-pay | Admitting: Pulmonary Disease

## 2020-11-13 ENCOUNTER — Ambulatory Visit: Payer: BC Managed Care – PPO | Admitting: Pulmonary Disease

## 2020-11-13 VITALS — BP 112/72 | HR 66 | Temp 97.9°F | Ht 61.0 in | Wt 157.1 lb

## 2020-11-13 DIAGNOSIS — R059 Cough, unspecified: Secondary | ICD-10-CM | POA: Diagnosis not present

## 2020-11-13 NOTE — Patient Instructions (Signed)
Follow up as needed if your cough recurs

## 2020-11-13 NOTE — Progress Notes (Signed)
[facility]  Chief Complaint  Patient presents with   Follow-up    Wanting to be proactive to prevent PNA at the end of the last 2 summers.  ? 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  Brief Summary:  Debbie [name] is a 50 y.o. female with a cough after viral upper respiratory infection in August 2021 in setting of seasonal allergic rhinitis with intermittent asthma, and reflux.      Subjective:   Chest xray from 02/28/20 was normal.  PFT from 03/22/20 was normal.  Cough is much better.  She uses zyrtec daily.  Intermittently uses flonase.  Notices more trouble with reflux if she eats certain foods or drinks alcohol.  When this happens she gets a spasm feeling in her left lower chest that lasts for a few seconds to a minute.  She isn't sure if she received pneumonia vaccine before.  Physical Exam:   Appearance - well kempt   ENMT - no sinus tenderness, clear nasal drainage, no oral exudate, no LAN, Mallampati 2 airway, no stridor, erythema of posterior pharynx  Respiratory - equal breath sounds bilaterally, no wheezing or rales  CV - s1s2 regular  rate and rhythm, no murmurs  Ext - no clubbing, no edema  Skin - no rashes  Psych - normal mood and affect   Pulmonary testing:  Serology 07/16/17 >> ANA 1:640 homogenous pattern PFT 03/22/20 >> FEV1 2.47 (91%), FEV1% 86, TLC 4.36 (91%), DLCO 100%  Chest Imaging:  CT chest 04/23/16 >> trace effusions, basilar ATX  Cardiac Tests:  Echo 07/21/18 >> EF 60 to 65%  Social History:  She  reports that she has never smoked. She has never used smokeless tobacco. She reports that she does not drink alcohol and does not use drugs.  Family History:  Her family history includes Arthritis in her mother; Cancer in her maternal grandfather; Colon cancer (age of onset: 5) in her maternal grandfather; Diabetes in her father, maternal grandmother, paternal grandfather, and another family member; Hashimoto's thyroiditis in her daughter; Heart attack in her mother; Heart disease in her paternal grandfather and paternal grandmother; Hernia in her mother; Hypertension in her mother; Lung cancer (age of onset: 21) in her maternal grandfather; Lupus in her sister; Pancreatic cancer (age of onset: 59) in her maternal grandfather; Rheum arthritis in her daughter.     Assessment/Plan:   Upper airway cough with post nasal drip. - continue zyrtec - prn flonase  Acid reflux. 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 - followed by Dr. Amil Amen with rheumatology  Pneumococcal vaccination. - advised her to check with her PCP to determine if she has received vaccination before, and if not then  she should proceed with pneumococcal vaccination   Time Spent Involved in Patient Care on Day of Examination:  24 minutes  Follow up:   Patient Instructions  Follow up as needed if your cough recurs  Medication List:   Allergies as of 11/13/2020       Reactions   Zofran [ondansetron Hcl] Other (See Comments)   Stutters and looses control of her arm and legs    Metoclopramide    "stutters and loses control of arms of legs"        Medication List        Accurate as of November 13, 2020 10:28 AM. If you have any questions, ask your nurse or doctor.          STOP taking these medications    ibuprofen 800 MG tablet Commonly known as: ADVIL Stopped by: Chesley Mires, MD   oxyCODONE 5 MG immediate release tablet Commonly known as: Oxy IR/ROXICODONE Stopped by: Chesley Mires, MD       TAKE these medications    anastrozole 1 MG tablet Commonly known as: ARIMIDEX Take 1 tablet (1 mg total) by mouth daily.   cetirizine 10 MG tablet Commonly known as: ZYRTEC Take 10 mg by mouth daily as needed for allergies.   FLONASE ALLERGY RELIEF NA Place into the nose as needed.   hydroxychloroquine 200 MG tablet Commonly known as: PLAQUENIL TAKE 1 TABLET EACH MORNING AND 1/2 TABLET AT BEDTIME   MULTIVITAMIN ADULT PO Take by mouth daily.   NEXIUM PO Take 40 mg by mouth daily.   polyethylene glycol 17 g packet Commonly known as: MIRALAX / GLYCOLAX Take 17 g by mouth 3 (three) times daily.   promethazine 25 MG tablet Commonly known as: PHENERGAN Take 25 mg by mouth every 6 (six) hours as needed for nausea or vomiting.   THERMOTABS PO Take 1 tablet by mouth daily.   VITAMIN B-12 PO Take by mouth.   VITAMIN C PO Take by mouth.   VITAMIN D PO Take by mouth.        Signature:  Chesley Mires, MD [place] Pager - [phone] 11/13/2020, 10:28 AM

## 2020-11-20 ENCOUNTER — Other Ambulatory Visit: Payer: Self-pay | Admitting: Obstetrics and Gynecology

## 2020-11-20 DIAGNOSIS — Z1231 Encounter for screening mammogram for malignant neoplasm of breast: Secondary | ICD-10-CM

## 2020-11-22 ENCOUNTER — Other Ambulatory Visit: Payer: Self-pay

## 2020-11-22 ENCOUNTER — Ambulatory Visit
Admission: RE | Admit: 2020-11-22 | Discharge: 2020-11-22 | Disposition: A | Discharge status: Home / Self Care | Payer: BC Managed Care – PPO | Source: Ambulatory Visit | Attending: Obstetrics and Gynecology | Admitting: Obstetrics and Gynecology

## 2020-11-22 DIAGNOSIS — Z1231 Encounter for screening mammogram for malignant neoplasm of breast: Secondary | ICD-10-CM

## 2020-11-23 ENCOUNTER — Telehealth: Payer: Self-pay | Admitting: Oncology

## 2020-11-23 ENCOUNTER — Telehealth: Payer: Self-pay | Admitting: *Deleted

## 2020-11-23 DIAGNOSIS — Z1589 Genetic susceptibility to other disease: Secondary | ICD-10-CM

## 2020-11-23 DIAGNOSIS — Z9889 Other specified postprocedural states: Secondary | ICD-10-CM

## 2020-11-23 DIAGNOSIS — Z79899 Other long term (current) drug therapy: Secondary | ICD-10-CM

## 2020-11-23 NOTE — Telephone Encounter (Signed)
This RN spoke with pt per her call stating need for an appointment as well as which breast imaging is needed next - " MRI or mammogram ".  Per chart review pt had pre surgical mammogram in Feb 2022 prior to lumpectomy.  Pt was scheduled for virtual/phone visit later that month with Dr Jannifer Rodney to review surgery outcome and plan of care- pt was not available at time of visit.  Tecia states she scheduled her mammogram screening- "  like I do every year- but when I went yesterday they said they ( the [facility]) was not sure if order was correct and due to time of day they couldn't call anyone- they thought I needed an MRI next "  This RN informed need for baseline diagnostic mammogram post surgery- which would have been discussed and ordered per scheduled visit in February.  Presently pt needs post surgical baseline diagnostic mammogram and visit with Dr Jana Hakim- further plan will be discussed at that visit.  Pt verbalized understanding and agreement to plan.  This RN placed appropriate orders per above.

## 2020-11-23 NOTE — Telephone Encounter (Signed)
Scheduled appt per 7/8 sch msg. Pt aware.  

## 2020-11-29 NOTE — Progress Notes (Signed)
[facility]  [phone] [phone]) [phone]     ID: Debbie [name] DOB: 08-07-1970  MR#: [id number]  IHW#:[id number]  Patient Care Team: Mateo Flow, MD as PCP - General (Family Medicine) Elouise Munroe, MD as Consulting Physician (Cardiology) Tat, Eustace Quail, DO as Consulting Physician (Neurology) Magrinat, Virgie Dad, MD as Consulting Physician (Oncology) Molli Posey, MD as Consulting Physician (Obstetrics and Gynecology) Simon Rhein, MD as Resident (Neurology) Erroll Luna, MD as Consulting Physician (General Surgery) Chesley Mires, MD as Consulting Physician (Pulmonary Disease) Scot Dock, NP OTHER MD: I connected with Dianah Field on 12/03/20 at 11:30 AM EDT by video and verified that I am speaking with the correct person using two identifiers.  I discussed the limitations, risks, security and privacy concerns of performing an evaluation and management service by telephone and the availability of in person appointments.  I also discussed with the patient that there may be a patient responsible charge related to this service. The patient expressed understanding and agreed to proceed.  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 She underwent her diagnostic mammogram today and notes that she received good results.  She continues on anastrozole daily with good tolerance.  She only notes hot flashes when she misses a dose.    Debbie [name] notes that she had an increased amount of difficulty with her last MRI.  She was given valium 68m on the way, and 557mbeforehand in the waiting room.  She said that the valium was not strong enough and she needs something stronger, even  if it means having to undergo the MRI under sedation and monitoring at the hospital.    Otherwise, she notes she is feeling well.  She continues to work as a tePharmacist, hospital   REVIEW OF SYSTEMS: Review of Systems  Constitutional:  Negative for appetite change, chills, fatigue, fever and unexpected weight change.  HENT:   Negative for hearing loss, lump/mass and trouble swallowing.   Eyes:  Negative for eye problems and icterus.  Respiratory:  Negative for chest tightness, cough and shortness of breath.   Cardiovascular:  Negative for chest pain, leg swelling and palpitations.  Gastrointestinal:  Negative for abdominal distention, abdominal pain, constipation, diarrhea, nausea and vomiting.  Endocrine: Positive for hot flashes.  Genitourinary:  Negative for difficulty urinating.   Musculoskeletal:  Negative for arthralgias.  Skin:  Negative for itching and rash.  Neurological:  Negative for dizziness, extremity weakness, headaches and numbness.  Hematological:  Negative for adenopathy. Does not bruise/bleed easily.  Psychiatric/Behavioral:  Negative for depression. The patient is not nervous/anxious.       COVID 19 VACCINATION STATUS: [employer] October 2021   HISTORY OF CURRENT ILLNESS: From the original intake note:  Debbie KERCHERas a family history of cancer in her maternal grandfather. He was diagnosed with metastatic cancer involving lung pancreas and colon.  It is not clear what the primary was.  He was 75 at the time of death..  With this background when PaMaeleighost recently saw Dr. 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  The patient's subsequent history is as detailed below.   PAST MEDICAL HISTORY: Past Medical History:  Diagnosis Date  Allergy    Anemia    Anorexia nervosa with bulimia 2004   Arthritis    ra   Gastroparesis    GERD (gastroesophageal reflux disease)    History of kidney stones     Lupus (HCC)    Menopause    Pain and swelling of right forearm    resolved   POTS (postural orthostatic tachycardia syndrome)    ?pots per 08-02-2018 cardiology note folllow up prn   Skin abnormalities 07/07/2019   right leg above knee pinhole area had fro months scabs over, will bleed if scraped   SLE (systemic lupus erythematosus) ([facility]) 08/29/2016  POTS syndrome   PAST SURGICAL HISTORY: Past Surgical History:  Procedure Laterality Date   ABDOMINAL HYSTERECTOMY     2x,2008-2009 partial   BREAST LUMPECTOMY WITH RADIOACTIVE SEED LOCALIZATION Left 06/28/2020   Procedure: LEFT BREAST LUMPECTOMY WITH RADIOACTIVE SEED LOCALIZATION;  Surgeon: Erroll Luna, MD;  Location: [facility];  Service: General;  Laterality: Left;   CARPAL TUNNEL RELEASE Bilateral    CHOLECYSTECTOMY     CYSTOSCOPY Bilateral 07/13/2019   Procedure: CYSTOSCOPY;  Surgeon: Molli Posey, MD;  Location: [facility];  Service: Gynecology;  Laterality: Bilateral;   CYSTOSCOPY WITH RETROGRADE PYELOGRAM, URETEROSCOPY AND STENT PLACEMENT Left 06/22/2015   Procedure: LEFT URETEROSCOPY AND STONE EXTRACTION ;  Surgeon: Kathie Rhodes, MD;  Location: [facility];  Service: Urology;  Laterality: Left;   KIDNEY STONE SURGERY     5   LAPAROSCOPIC BILATERAL SALPINGO OOPHERECTOMY Bilateral 07/13/2019   Procedure: LAPAROSCOPIC BILATERAL SALPINGO OOPHORECTOMY;  Surgeon: Molli Posey, MD;  Location: [facility];  Service: Gynecology;  Laterality: Bilateral;   OVARIAN CYST SURGERY  2007   LAP    FAMILY HISTORY: Family History  Problem Relation Age of Onset   Arthritis Mother        rheumatoid    Hernia Mother    Hypertension Mother    Heart attack Mother    Diabetes Father    Lupus Sister    Diabetes Maternal Grandmother    Cancer Maternal Grandfather    Pancreatic cancer Maternal Grandfather 34   Lung cancer Maternal Grandfather 16   Colon cancer Maternal Grandfather 88        colorectal   Diabetes Paternal Grandfather    Heart disease Paternal Grandfather    Rheum arthritis Daughter    Hashimoto's thyroiditis Daughter    Heart disease Paternal Grandmother    Diabetes Other   [name] died at age 30 from complications of diabetes.  His parents had no history of cancer.  [name] had 2 sisters and 3 brothers, with no cancer in any of those siblings.  The patient's mother is 22 years old as of December 2020.  Her father had metastatic cancer but the patient does not know what the primary was.  The maternal grandmother did not have cancer and neither did this single maternal aunt.   GYNECOLOGIC HISTORY:  No LMP recorded. Patient has had a hysterectomy. Menarche: 50 years old Age at first live birth: 50 years old [place] P 2 LMP age 85 (with hysterectomy HRT estrogen alone Hysterectomy? Yes, at age 59 BSO? 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  Her husband [name] works for Marketing executive.  There is son Ysidro Evert, 26 as of December 2020 lives in [place] and works Biomedical engineer towers.  Daughter Estill Bamberg, 46, is a  Development worker, community.  The patient has no grandchildren.  She attends a [employer] DIRECTIVES: In the absence of any documents to the contrary the patient's husband is her healthcare power of attorney   HEALTH MAINTENANCE: Social History   Tobacco Use   Smoking status: Never   Smokeless tobacco: Never  Vaping Use   Vaping Use: Never used  Substance Use Topics   Alcohol use: No    Comment: rarely    Drug use: No     Colonoscopy: 2016 (Dr. Oletta Lamas)  PAP: 01/2016, negative  Bone density: Never   Allergies  Allergen Reactions   Zofran [Ondansetron Hcl] Other (See Comments)    Stutters and looses control of her arm and legs   Metoclopramide     "stutters and loses control of arms of legs"    Current Outpatient Medications   Medication Sig Dispense Refill   anastrozole (ARIMIDEX) 1 MG tablet Take 1 tablet (1 mg total) by mouth daily. 90 tablet 4   Ascorbic Acid (VITAMIN C PO) Take by mouth.     cetirizine (ZYRTEC) 10 MG tablet Take 10 mg by mouth daily as needed for allergies.      Cyanocobalamin (VITAMIN B-12 PO) Take by mouth.     Esomeprazole Magnesium (NEXIUM PO) Take 40 mg by mouth daily.      Fluticasone Propionate (FLONASE ALLERGY RELIEF NA) Place into the nose as needed.      hydroxychloroquine (PLAQUENIL) 200 MG tablet TAKE 1 TABLET EACH MORNING AND 1/2 TABLET AT BEDTIME 135 tablet 0   Multiple Vitamins-Minerals (MULTIVITAMIN ADULT PO) Take by mouth daily.     Oral Electrolytes (THERMOTABS PO) Take 1 tablet by mouth daily.     polyethylene glycol (MIRALAX / GLYCOLAX) packet Take 17 g by mouth 3 (three) times daily.      promethazine (PHENERGAN) 25 MG tablet Take 25 mg by mouth every 6 (six) hours as needed for nausea or vomiting.     VITAMIN D PO Take by mouth.     No current facility-administered medications for this visit.    OBJECTIVE:   There were no vitals filed for this visit.   There is no height or weight on file to calculate BMI.   Wt Readings from Last 3 Encounters:  11/13/20 157 lb 2 oz (71.3 kg)  06/28/20 166 lb 10.7 oz (75.6 kg)  02/27/20 164 lb (74.4 kg)      ECOG FS:1 - Symptomatic but completely ambulatory Patient appears well, in no apparent distress.  Mood and behavior is normal, breathing is non labored.  Speech is normal.  Skin visualized without rash or lesion.    LAB RESULTS:  CMP     Component Value Date/Time   NA 140 06/25/2020 1406   K 4.4 06/25/2020 1406   CL 105 06/25/2020 1406   CO2 24 06/25/2020 1406   GLUCOSE 89 06/25/2020 1406   BUN 11 06/25/2020 1406   CREATININE 0.85 06/25/2020 1406   CREATININE 0.68 03/02/2018 1610   CALCIUM 9.7 06/25/2020 1406   PROT 7.0 06/25/2020 1406   ALBUMIN 3.9 06/25/2020 1406   AST 43 (H) 06/25/2020 1406   ALT 75 (H)  06/25/2020 1406   ALKPHOS 133 (H) 06/25/2020 1406   BILITOT 0.4 06/25/2020 1406   GFRNONAA >60 06/25/2020 1406   GFRNONAA 106 03/02/2018 1610   GFRAA 122 03/02/2018 1610    No results found for: TOTALPROTELP, ALBUMINELP, A1GS, A2GS, BETS, BETA2SER, GAMS, MSPIKE, SPEI  No  results found for: Nils Pyle, [facility]  Lab Results  Component Value Date   WBC 7.3 06/25/2020   NEUTROABS 3.7 06/25/2020   HGB 13.3 06/25/2020   HCT 39.1 06/25/2020   MCV 85.2 06/25/2020   PLT 370 06/25/2020   No results found for: LABCA2  No components found for: BSJGGE366  No results for input(s): INR in the last 168 hours.  No results found for: LABCA2  No results found for: QHU765  No results found for: YYT035  No results found for: WSF681  No results found for: CA2729  No components found for: HGQUANT  No results found for: CEA1 / No results found for: CEA1   No results found for: AFPTUMOR  No results found for: CHROMOGRNA  No results found for: HGBA, HGBA2QUANT, HGBFQUANT, HGBSQUAN (Hemoglobinopathy evaluation)   No results found for: LDH  No results found for: IRON, TIBC, IRONPCTSAT (Iron and TIBC)  No results found for: FERRITIN  Urinalysis    Component Value Date/Time   COLORURINE YELLOW 03/02/2016 0132   APPEARANCEUR CLEAR 03/02/2016 0132   LABSPEC 1.002 (L) 03/02/2016 0132   PHURINE 7.0 03/02/2016 0132   GLUCOSEU NEGATIVE 03/02/2016 0132   HGBUR NEGATIVE 03/02/2016 0132   BILIRUBINUR NEGATIVE 03/02/2016 0132   BILIRUBINUR NEG 10/11/2012 1728   KETONESUR NEGATIVE 03/02/2016 0132   PROTEINUR NEGATIVE 03/02/2016 0132   UROBILINOGEN 0.2 10/12/2012 1417   NITRITE NEGATIVE 03/02/2016 0132   LEUKOCYTESUR NEGATIVE 03/02/2016 0132    STUDIES: No results found.    ELIGIBLE FOR AVAILABLE RESEARCH PROTOCOL: no  ASSESSMENT: 50 y.o. 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  She continues on Anastrozole daily for risk reduction of her breast cancer risk.  She is tolerating this well and will continue this.   We reviewed her intensified screening and will continue this with breast MRI.  She received a total of 55m oral Valium before the MRI and had a lot of difficulty with tolerating it due to her anxiety and POTS diagnosis.  We need to do something differently.  I will discuss with Dr. MJana Hakim when he returns his preference for MRI under anesthesia/sedation, versus increasing the Valium.    She also wants to know about her pancreatic cancer risk.  We reviewed the guidelines which indicate MRCP for patients with ATM mutation and relative with pancreatic cancer.  Her grandfather had pancreatic cancer.  We will order MRCP with goal to have it completed when she has the above MRI as well.  I mentioned our GI specialist who sees our patients at risk for pancreatic cancer due to genetic mutations, Dr. CBryan Lemma  [place] [facility] [place] [facility] [place] the [place] cancer risk.    PAelianahas not undergone bone density testing and I will order this for next year when she undergoes her next mammogram.  In the meantime we talked about bone health and I encouraged calcium, vitamin d, and weight bearing exercises.    I will call PShanetteonce I hear what Dr.  Magrinat's preference is for operationalizing her MRI is.  We will go ahead and plan on seeing her in one year for f/u.   The  patient was provided an opportunity to ask questions and all were answered. The patient agreed with the plan and demonstrated an understanding of the instructions.   The patient was advised to call back or seek an in-person evaluation if the symptoms worsen or if the condition fails to improve as anticipated.   I provided 30 minutes of face-to-face video visit time during this encounter, and > 50% was spent counseling as documented under my assessment & plan.   Debbie Bihari, NP 11/29/20 10:49 AM Medical Oncology and Hematology [facility] [place], [place] [address] Tel. [phone]    Fax. [phone]   *Total Encounter Time as defined by the Centers for Medicare and Medicaid Services includes, in addition to the face-to-face time of a patient visit (documented in the note above) non-face-to-face time: obtaining and reviewing outside history, ordering and reviewing medications, tests or procedures, care coordination (communications with other health care professionals or caregivers) and documentation in the medical record.

## 2020-12-03 ENCOUNTER — Inpatient Hospital Stay: Payer: BC Managed Care – PPO | Attending: Adult Health | Admitting: Adult Health

## 2020-12-03 ENCOUNTER — Ambulatory Visit
Admission: RE | Admit: 2020-12-03 | Discharge: 2020-12-03 | Disposition: A | Discharge status: Home / Self Care | Payer: Self-pay | Source: Ambulatory Visit | Attending: Oncology | Admitting: Oncology

## 2020-12-03 ENCOUNTER — Encounter: Payer: Self-pay | Admitting: Adult Health

## 2020-12-03 ENCOUNTER — Other Ambulatory Visit: Payer: Self-pay

## 2020-12-03 ENCOUNTER — Ambulatory Visit: Payer: Self-pay

## 2020-12-03 DIAGNOSIS — Z79899 Other long term (current) drug therapy: Secondary | ICD-10-CM

## 2020-12-03 DIAGNOSIS — Z9189 Other specified personal risk factors, not elsewhere classified: Secondary | ICD-10-CM | POA: Diagnosis not present

## 2020-12-03 DIAGNOSIS — Z1239 Encounter for other screening for malignant neoplasm of breast: Secondary | ICD-10-CM

## 2020-12-03 DIAGNOSIS — Z1589 Genetic susceptibility to other disease: Secondary | ICD-10-CM

## 2020-12-03 DIAGNOSIS — Z9889 Other specified postprocedural states: Secondary | ICD-10-CM

## 2020-12-04 ENCOUNTER — Telehealth: Payer: Self-pay | Admitting: Adult Health

## 2020-12-04 NOTE — Telephone Encounter (Signed)
Scheduled appt per 7/18 sch msg. Mailed updated calendar to pt.

## 2020-12-10 ENCOUNTER — Other Ambulatory Visit: Payer: Self-pay | Admitting: Oncology

## 2020-12-14 ENCOUNTER — Other Ambulatory Visit: Payer: Self-pay | Admitting: Adult Health

## 2020-12-14 DIAGNOSIS — Z800 Family history of malignant neoplasm of digestive organs: Secondary | ICD-10-CM

## 2020-12-14 DIAGNOSIS — C50919 Malignant neoplasm of unspecified site of unspecified female breast: Secondary | ICD-10-CM

## 2020-12-14 MED ORDER — DIAZEPAM 10 MG PO TABS: ORAL_TABLET | ORAL | 0 refills | Status: DC

## 2020-12-14 NOTE — Progress Notes (Signed)
I reviewed with Debbie [name] Dr. Magrinat's recommendations for her MRIs.  She will undergo MRCP first, in the next month, will get a driver, and will take Valium '10mg'$  20 minutes before the MRI and then at the start of the MRI.  She will be due her MRI breast in 03/2021 and if MRCP is successful she can do this the same way.  PMP aware reviewed. I sent in the Valium to her pharmacy. She will call me and let me know if she has any questions or needs about this.   Wilber Bihari, NP

## 2021-01-02 ENCOUNTER — Telehealth: Payer: Self-pay | Admitting: Adult Health

## 2021-01-02 NOTE — Telephone Encounter (Signed)
Called to complete peer to peer for patient.  The insurance company did not realize that the MRCP is ordered per NCCN guidelines for pancreatic cancer screening since she has an ATM mutation and family history of pancreatic cancer as documented in my note.  I reviewed this with them and they approved her MRI.  Approval number (same as case #): [id number] valid from 01/03/2021-01/31/2021.    Time spent: 20 minutes  Wilber Bihari, NP

## 2021-01-08 ENCOUNTER — Telehealth: Payer: Self-pay | Admitting: Pulmonary Disease

## 2021-01-08 MED ORDER — NIRMATRELVIR/RITONAVIR (PAXLOVID)TABLET: 3.0000 | ORAL_TABLET | Freq: Two times a day (BID) | ORAL | 0 refills | Status: AC

## 2021-01-08 MED ORDER — NIRMATRELVIR/RITONAVIR (PAXLOVID)TABLET: 3.0000 | ORAL_TABLET | Freq: Two times a day (BID) | ORAL | 0 refills | Status: DC

## 2021-01-08 NOTE — Telephone Encounter (Signed)
Called and spoke with pt who states symptoms first began 8/22 and had the positive covid test 8/22  Pt has had chills but not sure if she has a temp as she has not checked temp. States that she has been feeling achy all over, has chest tightness.  States symptoms started with a sore throat but then developed a cough and the achiness.  Pt has taken mucinex to see if it would help which she said did help her get some rest overnight.  Due to this, pt wants to know what we recommend. Dr. Halford Chessman, please advise.

## 2021-01-08 NOTE — Telephone Encounter (Signed)
Normal renal function from February 2022.  Please send script for paxlovid.  She will need video visit later this week or early next week if her symptoms don't improve.

## 2021-01-08 NOTE — Telephone Encounter (Signed)
Called and spoke with pt letting her know recs stated by VS and she verbalized understanding. Rx for paxlovid has been sent to pharmacy for pt. Nothing further needed.

## 2021-07-08 ENCOUNTER — Other Ambulatory Visit: Payer: Self-pay

## 2021-07-08 MED ORDER — ANASTROZOLE 1 MG PO TABS: 1.0000 mg | ORAL_TABLET | Freq: Every day | ORAL | 3 refills | Status: DC

## 2021-09-24 ENCOUNTER — Encounter (HOSPITAL_COMMUNITY): Payer: Self-pay

## 2021-09-26 ENCOUNTER — Inpatient Hospital Stay: Payer: BC Managed Care – PPO | Attending: Adult Health | Admitting: Adult Health

## 2021-09-26 ENCOUNTER — Other Ambulatory Visit: Payer: Self-pay

## 2021-09-26 ENCOUNTER — Telehealth: Payer: Self-pay | Admitting: Adult Health

## 2021-09-26 ENCOUNTER — Encounter: Payer: Self-pay | Admitting: Adult Health

## 2021-09-26 VITALS — BP 126/82 | HR 85 | Temp 97.9°F | Resp 18 | Ht 61.0 in | Wt 138.0 lb

## 2021-09-26 DIAGNOSIS — Z800 Family history of malignant neoplasm of digestive organs: Secondary | ICD-10-CM | POA: Insufficient documentation

## 2021-09-26 DIAGNOSIS — Z1509 Genetic susceptibility to other malignant neoplasm: Secondary | ICD-10-CM | POA: Diagnosis present

## 2021-09-26 DIAGNOSIS — Z801 Family history of malignant neoplasm of trachea, bronchus and lung: Secondary | ICD-10-CM | POA: Insufficient documentation

## 2021-09-26 DIAGNOSIS — Z79811 Long term (current) use of aromatase inhibitors: Secondary | ICD-10-CM | POA: Diagnosis not present

## 2021-09-26 DIAGNOSIS — Z1239 Encounter for other screening for malignant neoplasm of breast: Secondary | ICD-10-CM | POA: Diagnosis not present

## 2021-09-26 DIAGNOSIS — G90A Postural orthostatic tachycardia syndrome (POTS): Secondary | ICD-10-CM | POA: Insufficient documentation

## 2021-09-26 NOTE — Progress Notes (Signed)
?[place]  ?Telephone:([phone] [phone]  ? ? ? ?ID: Debbie [name] DOB: 1971-03-29  MR#: [id number]  UMP#:[id number] ? ?Patient Care Team: ?Mateo Flow, MD as PCP - General (Family Medicine) ?Elouise Munroe, MD as Consulting Physician (Cardiology) ?Ludwig Clarks, DO as Consulting Physician (Neurology) ?Molli Posey, MD as Consulting Physician (Obstetrics and Gynecology) ?Simon Rhein, MD as Resident (Neurology) ?Erroll Luna, MD as Consulting Physician (General Surgery) ?Chesley Mires, MD as Consulting Physician (Pulmonary Disease) ?Gardenia Phlegm, NP as Nurse Practitioner (Hematology and Oncology) ?Scot Dock, NP ?OTHER MD: ? ? ? 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 ?CURRENT TREATMENT: Intensified screening; anastrozole ? ? ?INTERVAL HISTORY: ? ?Debbie [name] is here today for f/u of her increased breast cancer risk. She continues on anastrozole daily with good tolerance.  She has no new concerns today.  She underwent mammogram on 12/03/2020 that showed no evidence of malignancy.  She is recommended to undergo MRI annually and she was going to do this with high valium doses due to her issues with POTS.  She tells me she would rather be sedated or under anesthesia for the MRI due to the last MRI that she had tremendous difficulty undergoing.   ? ?REVIEW OF SYSTEMS: ?Review of Systems  ?Constitutional:  Negative for appetite change, chills, fatigue, fever and unexpected weight change.  ?HENT:   Negative for hearing loss, lump/mass and trouble swallowing.   ?Eyes:  Negative for eye problems and icterus.  ?Respiratory:  Negative for chest tightness, cough and shortness of breath.   ?Cardiovascular:  Negative for chest pain, leg swelling and palpitations.  ?Gastrointestinal:  Negative for abdominal distention, abdominal pain, constipation, diarrhea, nausea and vomiting.  ?Endocrine: Positive for hot flashes.  ?Genitourinary:  Negative  for difficulty urinating.   ?Musculoskeletal:  Negative for arthralgias.  ?Skin:  Negative for itching and rash.  ?Neurological:  Negative for dizziness, extremity weakness, headaches and numbness.  ?Hematological:  Negative for adenopathy. Does not bruise/bleed easily.  ?Psychiatric/Behavioral:  Negative for depression. The patient is not nervous/anxious.   ?  ? ? ? ?PAST MEDICAL HISTORY: ?Past Medical History:  ?Diagnosis Date  ? Allergy   ? Anemia   ? Anorexia nervosa with bulimia 2004  ? Arthritis   ? ra  ? Gastroparesis   ? GERD (gastroesophageal reflux disease)   ? History of kidney stones   ? Lupus ([place])   ? Menopause   ? Pain and swelling of right forearm   ? resolved  ? POTS (postural orthostatic tachycardia syndrome)   ? ?pots per 08-02-2018 cardiology note folllow up prn  ? Skin abnormalities 07/07/2019  ? right leg above knee pinhole area had fro months scabs over, will bleed if scraped  ? SLE (systemic lupus erythematosus) ([place]) 08/29/2016  ? ? ? ?PAST SURGICAL HISTORY: ?Reviewed  ? ?FAMILY HISTORY: ?Family History  ?Problem Relation Age of Onset  ? Arthritis Mother   ?     rheumatoid   ? Hernia Mother   ? Hypertension Mother   ? Heart attack Mother   ? Diabetes Father   ? Lupus Sister   ? Diabetes Maternal Grandmother   ? Cancer Maternal Grandfather   ? Pancreatic cancer Maternal Grandfather 61  ? Lung cancer Maternal Grandfather 24  ? Colon cancer Maternal Grandfather 51  ?     colorectal  ? Diabetes Paternal Grandfather   ? Heart disease Paternal Grandfather   ? Rheum arthritis Daughter   ?  Hashimoto's thyroiditis Daughter   ? Heart disease Paternal Grandmother   ? Diabetes Other   ?[name] died at age 61 from complications of diabetes.  His parents had no history of cancer.  [name] had 2 sisters and 3 brothers, with no cancer in any of those siblings.  The patient's mother is 98 years old as of December 2020.  Her father had metastatic cancer but the patient does not know  what the primary was.  The maternal grandmother did not have cancer and neither did this single maternal aunt. ? ? ?GYNECOLOGIC HISTORY:  ?No LMP recorded. Patient has had a hysterectomy. ?Menarche: 51 years old ?Age at first live birth: 51 years old ?GX P 2 ?LMP age 82 (with hysterectomy ?HRT estrogen alone ?Hysterectomy? Yes, at age 56 ?BSO? no ? ? 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  Her husband Debbie [name] works for Marketing executive.  There is son Debbie [name], 28 as of December 2020 lives in [place] and works Biomedical engineer towers.  Daughter Debbie [name], 21, is a Development worker, community.  The patient has no grandchildren.  She attends a [employer]  ? ?ADVANCED DIRECTIVES: In the absence of any documents to the contrary the patient's husband is her healthcare power of attorney ? ? ?HEALTH MAINTENANCE: ?Social History  ? ?Tobacco Use  ? Smoking status: Never  ? Smokeless tobacco: Never  ?Vaping Use  ? Vaping Use: Never used  ?Substance Use Topics  ? Alcohol use: No  ?  Comment: rarely   ? Drug use: No  ? ? ? Colonoscopy: 2016 (Dr. Oletta Lamas) ? PAP: 01/2016, negative ? Bone density: Never ?  ?Allergies  ?Allergen Reactions  ? Zofran [Ondansetron Hcl] Other (See Comments)  ?  Stutters and looses control of her arm and legs  ? Metoclopramide   ?  "stutters and loses control of arms of legs"  ? ? ?Current Outpatient Medications  ?Medication Sig Dispense Refill  ? anastrozole (ARIMIDEX) 1 MG tablet Take 1 tablet (1 mg total) by mouth daily. 90 tablet 3  ? Ascorbic Acid (VITAMIN C PO) Take by mouth.    ? cetirizine (ZYRTEC) 10 MG tablet Take 10 mg by mouth daily as needed for allergies.     ? Cyanocobalamin (VITAMIN B-12 PO) Take by mouth.    ? diazepam (VALIUM) 10 MG tablet Take 1 tablet 20 minutes before MRI, and then additional tablet at start of MRI. 4 tablet 0  ? Esomeprazole Magnesium (NEXIUM PO) Take 40 mg by mouth daily.     ? Fluticasone Propionate  (FLONASE ALLERGY RELIEF NA) Place into the nose as needed.     ? hydroxychloroquine (PLAQUENIL) 200 MG tablet TAKE 1 TABLET EACH MORNING AND 1/2 TABLET AT BEDTIME 135 tablet 0  ? Multiple Vitamins-Minerals (MULTIVITAMIN ADULT PO) Take by mouth daily.    ? Oral Electrolytes (THERMOTABS PO) Take 1 tablet by mouth daily.    ? polyethylene glycol (MIRALAX / GLYCOLAX) packet Take 17 g by mouth 3 (three) times daily.     ? promethazine (PHENERGAN) 25 MG tablet Take 25 mg by mouth every 6 (six) hours as needed for nausea or vomiting.    ? VITAMIN D PO Take by mouth.    ? ?No current facility-administered medications for this visit.  ? ? ?OBJECTIVE:  ? ?Vitals:  ? 09/26/21 1404  ?BP: 126/82  ?Pulse: 85  ?Resp: 18  ?Temp: 97.9 ?F (36.6 ?C)  ?  SpO2: 100%  ?   Body mass index is 26.07 kg/m?.   ?Wt Readings from Last 3 Encounters:  ?09/26/21 138 lb (62.6 kg)  ?11/13/20 157 lb 2 oz (71.3 kg)  ?06/28/20 166 lb 10.7 oz (75.6 kg)  ? ? ?  ECOG FS:1 - Symptomatic but completely ambulatory ?GENERAL: Patient is a well appearing female in no acute distress ?HEENT:  Sclerae anicteric.  Oropharynx clear and moist. No ulcerations or evidence of oropharyngeal candidiasis. Neck is supple.  ?NODES:  No cervical, supraclavicular, or axillary lymphadenopathy palpated.  ?BREAST EXAM:  left breast s/p lumpectomy, no nodules, masses, skin or nipple changes noted in either breast.   ?LUNGS:  Clear to auscultation bilaterally.  No wheezes or rhonchi. ?HEART:  Regular rate and rhythm. No murmur appreciated. ?ABDOMEN:  Soft, nontender.  Positive, normoactive bowel sounds. No organomegaly palpated. ?MSK:  No focal spinal tenderness to palpation. Full range of motion bilaterally in the upper extremities. ?EXTREMITIES:  No peripheral edema.   ?SKIN:  Clear with no obvious rashes or skin changes. No nail dyscrasia. ?NEURO:  Nonfocal. Well oriented.  Appropriate affect. ? ? ?LAB RESULTS: ?None today ? ?ELIGIBLE FOR AVAILABLE RESEARCH PROTOCOL:  no ? ?ASSESSMENT: 51 y.o. 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 ?(1) ovarian cancer risk: ? (a) status post bilateral salpingo-oophorectomy 07/13/2019 with benign pathology ? ?(2)

## 2021-09-26 NOTE — Telephone Encounter (Signed)
Rescheduled appointment per patients request. Called patient to schedule an appointment per [facility] 5/11 los (1 year FU with Dr.Iruku). While on the phone with the patient, she stated she did NOT want to schedule a 1 year FU at this time. Instead, she wanted her December 02, 2021 changed from [place] to [place] and wanted it after her MRI in June. Patient is aware of all changes made.  ?

## 2021-10-01 ENCOUNTER — Other Ambulatory Visit (HOSPITAL_COMMUNITY): Payer: Self-pay | Admitting: Adult Health

## 2021-10-15 ENCOUNTER — Encounter (HOSPITAL_COMMUNITY): Payer: Self-pay

## 2021-10-15 NOTE — Progress Notes (Signed)
PCP - Mateo Flow, MD Cardiologist - Elouise Munroe, MD  EKG - DOS Chest x-ray -  ECHO -  Cardiac Cath -  CPAP -   ERAS Protcol -  COVID TEST-   Anesthesia review: yes - POTS  -------------  SDW INSTRUCTIONS:  Your procedure is scheduled on 6/1. Please report to [facility] Main Entrance "A" at [place] and check in at the Admitting office. Call this number if you have problems the morning of surgery: [phone]   Remember: Do not eat or drink after midnight the night before your surgery   Medications to take morning of surgery with a sip of water include: esomeprazole (NEXIUM)  hydroxychloroquine (PLAQUENIL)  promethazine (PHENERGAN)   As of today, STOP taking any Aspirin (unless otherwise instructed by your surgeon), Aleve, Naproxen, Ibuprofen, Motrin, Advil, Goody's, BC's, all herbal medications, fish oil, and all vitamins.    The Morning of Surgery Do not wear jewelry, make-up or nail polish. Do not wear lotions, powders, or perfumes, or deodorant Do not bring valuables to the hospital. [facility] is not responsible for any belongings or valuables.  If you are a smoker, DO NOT Smoke 24 hours prior to surgery  If you wear a CPAP at night please bring your mask the morning of surgery   Remember that you must have someone to transport you home after your surgery, and remain with you for 24 hours if you are discharged the same day.  Please bring cases for contacts, glasses, hearing aids, dentures or bridgework because it cannot be worn into surgery.   Patients discharged the day of surgery will not be allowed to drive home.   Please shower the NIGHT BEFORE/MORNING OF SURGERY (use antibacterial soap like DIAL soap if possible). Wear comfortable clothes the morning of surgery. Oral Hygiene is also important to reduce your risk of infection.  Remember - BRUSH YOUR TEETH THE MORNING OF SURGERY WITH YOUR REGULAR TOOTHPASTE  Patient denies shortness of breath,  fever, cough and chest pain.

## 2021-10-16 ENCOUNTER — Encounter (HOSPITAL_COMMUNITY): Payer: Self-pay | Admitting: Anesthesiology

## 2021-10-16 NOTE — Anesthesia Preprocedure Evaluation (Signed)
Anesthesia Evaluation  Patient identified by MRN, date of birth, ID band Patient awake    Reviewed: Allergy & Precautions, NPO status , Patient's Chart, lab work & pertinent test results, reviewed documented beta blocker date and time   Airway Mallampati: II  TM Distance: >3 FB Neck ROM: Full    Dental no notable dental hx. (+) Teeth Intact, Dental Advisory Given   Pulmonary    Pulmonary exam normal breath sounds clear to auscultation       Cardiovascular negative cardio ROS Normal cardiovascular exam Rhythm:Regular Rate:Normal     Neuro/Psych  Headaches, PSYCHIATRIC DISORDERS Hx/o eating disorder Anorexia nervosaHx/o post herpetic neuralgia Tremor right hand  Neuromuscular disease    GI/Hepatic Neg liver ROS, GERD  Medicated,Gastroparesis   Endo/Other  High risk for breast Ca  Renal/GU Renal diseaseHx/o renal calculi  negative genitourinary   Musculoskeletal  (+) Arthritis , Rheumatoid disorders,  Hx/o SLE   Abdominal   Peds  Hematology  (+) Blood dyscrasia, anemia ,   Anesthesia Other Findings   Reproductive/Obstetrics                            Anesthesia Physical Anesthesia Plan  ASA: 2  Anesthesia Plan: MAC   Post-op Pain Management: Minimal or no pain anticipated   Induction: Intravenous  PONV Risk Score and Plan: 3 and Treatment may vary due to age or medical condition  Airway Management Planned: Natural Airway and Nasal Cannula  Additional Equipment: None  Intra-op Plan:   Post-operative Plan: Extubation in OR  Informed Consent:     Dental advisory given  Plan Discussed with: CRNA and Anesthesiologist  Anesthesia Plan Comments:       Anesthesia Quick Evaluation

## 2021-10-17 ENCOUNTER — Ambulatory Visit (HOSPITAL_COMMUNITY): Payer: BC Managed Care – PPO | Admitting: Emergency Medicine

## 2021-10-17 ENCOUNTER — Ambulatory Visit (HOSPITAL_COMMUNITY)
Admission: RE | Admit: 2021-10-17 | Discharge: 2021-10-17 | Disposition: A | Discharge status: Home / Self Care | Payer: BC Managed Care – PPO | Source: Ambulatory Visit | Attending: Adult Health | Admitting: Adult Health

## 2021-10-17 ENCOUNTER — Other Ambulatory Visit: Payer: Self-pay

## 2021-10-17 ENCOUNTER — Encounter (HOSPITAL_COMMUNITY): Payer: Self-pay

## 2021-10-17 ENCOUNTER — Encounter (HOSPITAL_COMMUNITY): Admission: RE | Disposition: A | Discharge status: Home / Self Care | Payer: Self-pay | Source: Ambulatory Visit

## 2021-10-17 DIAGNOSIS — Z1231 Encounter for screening mammogram for malignant neoplasm of breast: Secondary | ICD-10-CM | POA: Diagnosis present

## 2021-10-17 DIAGNOSIS — K449 Diaphragmatic hernia without obstruction or gangrene: Secondary | ICD-10-CM | POA: Insufficient documentation

## 2021-10-17 DIAGNOSIS — K219 Gastro-esophageal reflux disease without esophagitis: Secondary | ICD-10-CM | POA: Diagnosis not present

## 2021-10-17 DIAGNOSIS — Z1239 Encounter for other screening for malignant neoplasm of breast: Secondary | ICD-10-CM

## 2021-10-17 DIAGNOSIS — M069 Rheumatoid arthritis, unspecified: Secondary | ICD-10-CM | POA: Diagnosis not present

## 2021-10-17 HISTORY — PX: RADIOLOGY WITH ANESTHESIA: SHX6223

## 2021-10-17 LAB — CBC
HCT: 40.9 % (ref 36.0–46.0)
Hemoglobin: 13.8 g/dL (ref 12.0–15.0)
MCH: 29 pg (ref 26.0–34.0)
MCHC: 33.7 g/dL (ref 30.0–36.0)
MCV: 85.9 fL (ref 80.0–100.0)
Platelets: 329 10*3/uL (ref 150–400)
RBC: 4.76 MIL/uL (ref 3.87–5.11)
RDW: 12.4 % (ref 11.5–15.5)
WBC: 7 10*3/uL (ref 4.0–10.5)
nRBC: 0 % (ref 0.0–0.2)

## 2021-10-17 LAB — BASIC METABOLIC PANEL
Anion gap: 8 (ref 5–15)
BUN: 16 mg/dL (ref 6–20)
CO2: 25 mmol/L (ref 22–32)
Calcium: 9.8 mg/dL (ref 8.9–10.3)
Chloride: 108 mmol/L (ref 98–111)
Creatinine, Ser: 0.64 mg/dL (ref 0.44–1.00)
GFR, Estimated: >60 mL/min (ref >60)
Glucose, Bld: 81 mg/dL (ref 70–99)
Potassium: 3.8 mmol/L (ref 3.5–5.1)
Sodium: 141 mmol/L (ref 135–145)

## 2021-10-17 SURGERY — MRI WITH ANESTHESIA
Anesthesia: General | Laterality: Bilateral

## 2021-10-17 MED ORDER — DIPHENHYDRAMINE HCL 50 MG/ML IJ SOLN
INTRAMUSCULAR | Status: DC | PRN
  Administered 2021-10-17 (×2): 6.25 mg via INTRAVENOUS

## 2021-10-17 MED ORDER — ORAL CARE MOUTH RINSE: 15.0000 mL | Freq: Once | OROMUCOSAL | Status: AC

## 2021-10-17 MED ORDER — MIDAZOLAM HCL 5 MG/5ML IJ SOLN
INTRAMUSCULAR | Status: DC | PRN
  Administered 2021-10-17 (×2): 1 mg via INTRAVENOUS

## 2021-10-17 MED ORDER — DIPHENHYDRAMINE HCL 50 MG/ML IJ SOLN
INTRAMUSCULAR | Status: AC
  Filled 2021-10-17: qty 1

## 2021-10-17 MED ORDER — AMISULPRIDE (ANTIEMETIC) 5 MG/2ML IV SOLN: 10.0000 mg | Freq: Once | INTRAVENOUS | Status: DC | PRN

## 2021-10-17 MED ORDER — LACTATED RINGERS IV SOLN: INTRAVENOUS | Status: DC

## 2021-10-17 MED ORDER — GADOBUTROL 1 MMOL/ML IV SOLN
6.0000 mL | Freq: Once | INTRAVENOUS | Status: AC | PRN
  Administered 2021-10-17: 6 mL via INTRAVENOUS

## 2021-10-17 MED ORDER — CHLORHEXIDINE GLUCONATE 0.12 % MT SOLN
OROMUCOSAL | Status: AC
Start: 2021-10-17 — End: 2021-10-17
  Administered 2021-10-17: 15 mL via OROMUCOSAL
  Filled 2021-10-17: qty 15

## 2021-10-17 MED ORDER — CHLORHEXIDINE GLUCONATE 0.12 % MT SOLN: 15.0000 mL | Freq: Once | OROMUCOSAL | Status: AC

## 2021-10-17 NOTE — Transfer of Care (Signed)
Immediate Anesthesia Transfer of Care Note  Patient: [name]  Procedure(s) Performed: MRI WITH ANESTHESIA BILATERAL BREASTS WITH AND WITHOUT CONTRAST (Bilateral)  Patient Location: PACU  Anesthesia Type:MAC  Level of Consciousness: awake, alert  and oriented  Airway & Oxygen Therapy: Patient Spontanous Breathing  Post-op Assessment: Report given to RN and Post -op Vital signs reviewed and stable  Post vital signs: Reviewed and stable  Last Vitals:  Vitals Value Taken Time  BP 116/79 10/17/21 1143  Temp    Pulse 75 10/17/21 1143  Resp 13 10/17/21 1143  SpO2 100 % 10/17/21 1143    Last Pain:  Vitals:   10/17/21 1130  TempSrc:   PainSc: 0-No pain         Complications: No notable events documented.

## 2021-10-17 NOTE — Anesthesia Postprocedure Evaluation (Signed)
Anesthesia Post Note  Patient: Debbie [name]  Procedure(s) Performed: MRI WITH ANESTHESIA BILATERAL BREASTS WITH AND WITHOUT CONTRAST (Bilateral)     Patient location during evaluation: PACU Anesthesia Type: MAC Level of consciousness: awake and alert and oriented Pain management: pain level controlled Vital Signs Assessment: post-procedure vital signs reviewed and stable Respiratory status: spontaneous breathing, nonlabored ventilation and respiratory function stable Cardiovascular status: stable and blood pressure returned to baseline Postop Assessment: no apparent nausea or vomiting Anesthetic complications: no   No notable events documented.  Last Vitals:  Vitals:   10/17/21 0816 10/17/21 1130  BP: (!) 123/91 125/81  Pulse: 81 73  Resp: 18 18  Temp: 36.9 C (!) 36.3 C  SpO2: 98% 100%    Last Pain:  Vitals:   10/17/21 1130  TempSrc:   PainSc: 0-No pain                 Pharrah Rottman A.

## 2021-10-18 ENCOUNTER — Inpatient Hospital Stay: Payer: BC Managed Care – PPO | Attending: Adult Health | Admitting: Hematology and Oncology

## 2021-10-18 ENCOUNTER — Encounter (HOSPITAL_COMMUNITY): Payer: Self-pay | Admitting: Radiology

## 2021-10-18 VITALS — BP 117/78 | HR 70 | Temp 97.7°F | Resp 16 | Ht 61.0 in | Wt 138.9 lb

## 2021-10-18 DIAGNOSIS — Z1509 Genetic susceptibility to other malignant neoplasm: Secondary | ICD-10-CM | POA: Insufficient documentation

## 2021-10-18 DIAGNOSIS — Z801 Family history of malignant neoplasm of trachea, bronchus and lung: Secondary | ICD-10-CM | POA: Diagnosis not present

## 2021-10-18 DIAGNOSIS — Z79811 Long term (current) use of aromatase inhibitors: Secondary | ICD-10-CM | POA: Insufficient documentation

## 2021-10-18 DIAGNOSIS — Z1239 Encounter for other screening for malignant neoplasm of breast: Secondary | ICD-10-CM | POA: Diagnosis not present

## 2021-10-18 DIAGNOSIS — Z800 Family history of malignant neoplasm of digestive organs: Secondary | ICD-10-CM | POA: Diagnosis not present

## 2021-10-18 DIAGNOSIS — Z1501 Genetic susceptibility to malignant neoplasm of breast: Secondary | ICD-10-CM | POA: Insufficient documentation

## 2021-10-18 NOTE — Progress Notes (Signed)
Debbie [name]  Telephone:(336) [phone] Fax:(336) [phone]     ID: Debbie [name] DOB: 1971/01/21  MR#: [id number]  IRW#:[id number]  Patient Care Team: Mateo Flow, MD as PCP - General (Family Medicine) Elouise Munroe, MD as Consulting Physician (Cardiology) Tat, Eustace Quail, DO as Consulting Physician (Neurology) Molli Posey, MD as Consulting Physician (Obstetrics and Gynecology) Simon Rhein, MD as Resident (Neurology) Erroll Luna, MD as Consulting Physician (General Surgery) Chesley Mires, MD as Consulting Physician (Pulmonary Disease) Delice Bison, Charlestine Massed, NP as Nurse Practitioner (Hematology and Oncology) Benay Pike, MD OTHER MD:  CHIEF COMPLAINT: 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  She was recently seen with our APP, but wanted to establish with me since retirement of Dr Jana Hakim. She takes anastrazole for high risk breast cancer She also just had an MRI of her breasts, no malignancy, fat necrosis reported MRCP abdomen pending She denies any abdominal complaints or weight loss. No FH of cancer except maternal grandfather with pancreatic cancer Rest of the pertinent 10 point ROS reviewed and neg.  REVIEW OF SYSTEMS: Review of Systems  Constitutional:  Negative for appetite change, chills, fatigue, fever and unexpected weight change.  HENT:   Negative for hearing loss, lump/mass and trouble swallowing.   Eyes:  Negative for eye problems and icterus.  Respiratory:  Negative for chest tightness, cough and shortness of breath.   Cardiovascular:  Negative for chest pain, leg swelling and palpitations.  Gastrointestinal:  Negative for abdominal distention, abdominal pain, constipation, diarrhea, nausea and vomiting.  Endocrine: Positive for hot flashes.  Genitourinary:  Negative for  difficulty urinating.   Musculoskeletal:  Negative for arthralgias.  Skin:  Negative for itching and rash.  Neurological:  Negative for dizziness, extremity weakness, headaches and numbness.  Hematological:  Negative for adenopathy. Does not bruise/bleed easily.  Psychiatric/Behavioral:  Negative for depression. The patient is not nervous/anxious.        PAST MEDICAL HISTORY: Past Medical History:  Diagnosis Date   Allergy    Anemia    Anorexia nervosa with bulimia 2004   Arthritis    ra   Gastroparesis    GERD (gastroesophageal reflux disease)    History of kidney stones    Lupus ([facility])    Menopause    Pain and swelling of right forearm    resolved   POTS (postural orthostatic tachycardia syndrome)    ?pots per 08-02-2018 cardiology note folllow up prn   Skin abnormalities 07/07/2019   right leg above knee pinhole area had fro months scabs over, will bleed if scraped   SLE (systemic lupus erythematosus) ([place]) 08/29/2016     PAST SURGICAL HISTORY: Reviewed   FAMILY HISTORY: Family History  Problem Relation Age of Onset   Arthritis Mother        rheumatoid    Hernia Mother    Hypertension Mother    Heart attack Mother    Diabetes Father    Lupus Sister    Diabetes Maternal Grandmother    Cancer Maternal Grandfather    Pancreatic cancer Maternal Grandfather 87   Lung cancer Maternal Grandfather 61   Colon cancer Maternal Grandfather 60       colorectal   Diabetes Paternal Grandfather    Heart disease Paternal Grandfather    Rheum arthritis Daughter    Hashimoto's thyroiditis Daughter    Heart disease Paternal  Grandmother    Diabetes Other   [name] died at age 37 from complications of diabetes.  His parents had no history of cancer.  [name] had 2 sisters and 3 brothers, with no cancer in any of those siblings.  The patient's mother is 39 years old as of December 2020.  Her father had metastatic cancer but the patient does not know what  the primary was.  The maternal grandmother did not have cancer and neither did this single maternal aunt.   GYNECOLOGIC HISTORY:  No LMP recorded. Patient has had a hysterectomy. Menarche: 51 years old Age at first live birth: 50 years old [place] P 2 LMP age 11 (with hysterectomy HRT estrogen alone Hysterectomy? Yes, at age 31 BSO? 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  Her husband Debbie [name] works for [employer].  There is son Debbie [name], 30 as of December 2020 lives in [place] and works Biomedical engineer towers.  Daughter Debbie [name], 21, is a Development worker, community.  The patient has no grandchildren.  She attends a [employer] DIRECTIVES: In the absence of any documents to the contrary the patient's husband is her healthcare power of attorney   HEALTH MAINTENANCE: Social History   Tobacco Use   Smoking status: Never   Smokeless tobacco: Never  Vaping Use   Vaping Use: Never used  Substance Use Topics   Alcohol use: No    Comment: rarely    Drug use: No     Colonoscopy: 2016 (Dr. Oletta Lamas)  PAP: 01/2016, negative  Bone density: Never   Allergies  Allergen Reactions   Zofran [Ondansetron Hcl] Other (See Comments)    Stutters and looses control of her arm and legs   Metoclopramide     "stutters and loses control of arms of legs"    No current facility-administered medications for this visit.   No current outpatient medications on file.   Facility-Administered Medications Ordered in Other Visits  Medication Dose Route Frequency Provider Last Rate Last Admin   amisulpride (BARHEMSYS) injection 10 mg  10 mg Intravenous Once PRN Josephine Igo, MD       lactated ringers infusion   Intravenous Continuous Josephine Igo, MD 10 mL/hr at 10/17/21 1024 Continued from Pre-op at 10/17/21 1024    OBJECTIVE:   There were no vitals filed for this visit.    There is no height or weight on file to  calculate BMI.   Wt Readings from Last 3 Encounters:  10/15/21 135 lb (61.2 kg)  09/26/21 138 lb (62.6 kg)  11/13/20 157 lb 2 oz (71.3 kg)    PE deferred in lieu of counseling.   LAB RESULTS: None today  ELIGIBLE FOR AVAILABLE RESEARCH PROTOCOL: no  ASSESSMENT: 51 y.o. 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 We have discussed about MRI alternating with mammogram for breast cancer screening. Long term risks of MRI are unknown at this time She was encouraged to have MRCP scheduled. She  was given phone number to call and schedule. Continue anastrozole as recommended. Will consider bone density later this yr. All her questions were answered to the best of my knowledge.  Total time spent: 30 minutes.  *Total Encounter Time as defined by the Centers for Medicare and Medicaid Services includes, in addition to the face-to-face time of a patient visit (documented in the note above) non-face-to-face time: obtaining and reviewing outside history, ordering and reviewing medications, tests or procedures, care coordination (communications with other health care professionals or caregivers) and documentation in the medical record.

## 2021-10-25 ENCOUNTER — Ambulatory Visit: Payer: Self-pay | Admitting: Hematology and Oncology

## 2021-12-02 ENCOUNTER — Ambulatory Visit: Payer: Self-pay | Admitting: Adult Health

## 2021-12-04 ENCOUNTER — Ambulatory Visit: Payer: BC Managed Care – PPO

## 2022-01-23 ENCOUNTER — Ambulatory Visit: Payer: BC Managed Care – PPO

## 2022-01-27 ENCOUNTER — Ambulatory Visit: Payer: BC Managed Care – PPO | Admitting: Pulmonary Disease

## 2022-01-27 ENCOUNTER — Encounter: Payer: Self-pay | Admitting: Pulmonary Disease

## 2022-01-27 VITALS — BP 100/60 | HR 79 | Ht 61.0 in | Wt 139.0 lb

## 2022-01-27 DIAGNOSIS — K1379 Other lesions of oral mucosa: Secondary | ICD-10-CM

## 2022-01-27 NOTE — Patient Instructions (Signed)
Call if you need a referral to an allergist to further assess for the presence of food allergies

## 2022-01-27 NOTE — Progress Notes (Signed)
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  Brief Summary:  Debbie [name] is a 51 y.o. female with a cough after viral upper respiratory infection in August 2021 in setting of seasonal allergic rhinitis with intermittent asthma, and reflux.      Subjective:   She has noticed getting sores in her mouth.  She describes these as bumps on her tongue.  She also will get her tongue scooped out on the sides.  This comes and goes.  She has trouble swallowing when this happens.  She isn't sure if this is related to any particular foods.  She is not having runny nose or post nasal drip.  Not having cough, wheeze, or sputum.  Denies eczema or hives.  No tongue or lip swelling.  She was referred to ENT but appointment hasn't been scheduled yet.  Physical Exam:   Appearance - well kempt   ENMT - no sinus tenderness, no oral exudate, no LAN, Mallampati 2 airway, no stridor, scalloped tongue  Respiratory - equal breath sounds bilaterally, no wheezing or  rales  CV - s1s2 regular rate and rhythm, no murmurs  Ext - no clubbing, no edema  Skin - no rashes  Psych - normal mood and affect    Pulmonary testing:  Serology 07/16/17 >> ANA 1:640 homogenous pattern PFT 03/22/20 >> FEV1 2.47 (91%), FEV1% 86, TLC 4.36 (91%), DLCO 100%  Chest Imaging:  CT chest 04/23/16 >> trace effusions, basilar ATX  Cardiac Tests:  Echo 07/21/18 >> EF 60 to 65%  Social History:  She  reports that she has never smoked. She has never used smokeless tobacco. She reports that she does not drink alcohol and does not use drugs.  Family History:  Her family history includes Arthritis in her mother; Cancer in her maternal grandfather; Colon cancer (age of onset: 58) in her maternal grandfather; Diabetes in her father, maternal grandmother, paternal grandfather, and another family member; Hashimoto's thyroiditis in her daughter; Heart attack in her mother; Heart disease in her paternal grandfather and paternal grandmother; Hernia in her mother; Hypertension in her mother; Lung cancer (age of onset: 32) in her maternal grandfather; Lupus in her sister; Pancreatic cancer (age of onset: 47) in her maternal grandfather; Rheum arthritis in her daughter.     Assessment/Plan:   Recurrent oral lesions. 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 - prn zyrtec, and flonase  Acid reflux with small hiatal hernia. 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 - followed by Leafy Kindle with rheumatology  Truncation  mutation. - followed by Dr. Benay Pike with oncology   Time Spent Involved in Patient Care on Day of Examination:  27 minutes  Follow up:   Patient Instructions  Call if you need a referral to an allergist to further assess for the presence of food allergies    Medication List:   Allergies as of 01/27/2022       Reactions   Zofran [ondansetron Hcl] Other (See Comments)   Stutters and looses control of her arm and legs   Metoclopramide    "stutters and loses control of arms of legs"        Medication List        Accurate as of January 27, 2022  4:57 PM. If you have any questions, ask your nurse or doctor.          ALLERGY EYE DROPS OP Place 1 drop into both eyes daily as needed (allergies).   anastrozole 1 MG tablet Commonly known as: ARIMIDEX Take 1 tablet (1 mg total) by mouth daily. What changed: when to take this   cetirizine-pseudoephedrine 5-120 MG tablet Commonly known as: ZYRTEC-D Take 1 tablet by mouth daily.   cyanocobalamin 1000 MCG tablet Commonly known as: VITAMIN B12 Take 1,000 mcg by mouth daily.   Dialyvite Vitamin D 5000 125 MCG (5000 UT) capsule Generic drug: Cholecalciferol Take 5,000 Units by mouth daily.   diazepam 10 MG tablet Commonly known as: Valium Take 1 tablet 20 minutes before MRI, and then additional tablet at start of MRI.   esomeprazole 40 MG capsule Commonly known as: NEXIUM Take 40 mg by mouth daily.   FLONASE ALLERGY RELIEF NA Place 1 spray into the nose daily as needed (allergies).   hydroxychloroquine 200 MG tablet Commonly known as: PLAQUENIL TAKE 1 TABLET EACH MORNING AND 1/2 TABLET AT BEDTIME   MULTIVITAMIN ADULT PO Take 2 tablets by mouth daily.   polyethylene glycol 17 g packet Commonly known as: MIRALAX / GLYCOLAX Take 34-51 g by mouth at bedtime.   promethazine 12.5 MG tablet Commonly known as: PHENERGAN Take 12.5 mg by mouth every 6 (six) hours as needed for nausea/vomiting.   VITAMIN C  PO Take 1 tablet by mouth daily.        Signature:  Chesley Mires, MD [place] Pager - [phone] 01/27/2022, 4:57 PM

## 2022-02-24 ENCOUNTER — Ambulatory Visit: Payer: BC Managed Care – PPO

## 2022-04-09 ENCOUNTER — Ambulatory Visit: Payer: BC Managed Care – PPO

## 2022-04-28 ENCOUNTER — Encounter: Payer: Self-pay | Admitting: Internal Medicine

## 2022-04-28 ENCOUNTER — Ambulatory Visit (INDEPENDENT_AMBULATORY_CARE_PROVIDER_SITE_OTHER): Payer: BC Managed Care – PPO

## 2022-04-28 ENCOUNTER — Ambulatory Visit: Payer: BC Managed Care – PPO | Admitting: Internal Medicine

## 2022-04-28 VITALS — BP 122/78 | HR 90 | Temp 98.1°F | Ht 61.0 in | Wt 155.0 lb

## 2022-04-28 DIAGNOSIS — R058 Other specified cough: Secondary | ICD-10-CM

## 2022-04-28 MED ORDER — METHYLPREDNISOLONE ACETATE 80 MG/ML IJ SUSP
120.0000 mg | Freq: Once | INTRAMUSCULAR | Status: AC
  Administered 2022-04-28: 120 mg via INTRAMUSCULAR

## 2022-04-28 MED ORDER — ACETAMINOPHEN-CODEINE 300-30 MG PO TABS: 1.0000 | ORAL_TABLET | ORAL | 0 refills | Status: AC | PRN

## 2022-04-28 NOTE — Patient Instructions (Signed)
Nexium 40 mg Take 30-60 min before first meal of the day and add pepcid 20 mg after supper.  Take delsym two tsp every 12 hours and supplement if needed with  Tylenol #3   up to 1-2 every 4 hours to suppress the urge to cough. Swallowing water and/or using ice chips/non mint and menthol containing candies (such as lifesavers or sugarless jolly ranchers) are also effective.  You should rest your voice and avoid activities that you know make you cough.   Once you have eliminated the cough for 3 straight days try reducing the Tylenol #3 first,  then the delsym as tolerated.     Depomedrol 120 mg IM   For short of breath > albuterol 2 puffs every 4 hours as needed    Call next week if not a lot better    Please remember to go to the  x-ray department  for your tests - we will call you with the results when they are available

## 2022-04-28 NOTE — Progress Notes (Unsigned)
Debbie [name], female    DOB: 16-Aug-1970   MRN: [id number]   Brief patient profile:  45 yowf  never smoker pt of Dr Halford Chessman  referred to pulmonary clinic 04/28/2022 for recurrent cough onset  summer 2022 and maint  on nexium 40 mg p bfast and prn zyrtec d but had neither med for a week  before acute onset of cough p eating 04/18/22 already rx 12/3  dex/cough medication/ amox with neg Resp panel but no better.   PFTs  03/22/20 nl x erv 12% at wt 165 s prior rx    History of Present Illness  04/28/2022  Pulmonary/ 1st office eval/[facility] - Acute  Chief Complaint  Patient presents with   Follow-up    Cough since 04/18/22.  Also, SOB and dyspnea with talking.  Tested for flu, COVID. Strep.  All negative.    Dyspnea:  50 ft walking across office/ finished doxy 04/26/22 no better Cough: worse in am and with talking harsh honking dry quality  Sleep: 45 degrees vs baseline  SABA use: normally not using   No obvious day to day or daytime pattern/variability or assoc excess/ purulent sputum or mucus plugs or hemoptysis or cp or chest tightness, subjective wheeze or overt sinus or hb symptoms.      Also denies any obvious fluctuation of symptoms with weather or environmental changes or other aggravating or alleviating factors except as outlined above   No unusual exposure hx or h/o childhood pna/ asthma or knowledge of premature birth.  Current Allergies, Complete Past Medical History, Past Surgical History, Family History, and Social History were reviewed in [employer] record.  ROS  The following are not active complaints unless bolded Hoarseness, sore throat, dysphagia, dental problems, itching, sneezing,  nasal congestion or discharge of excess mucus or purulent secretions, ear ache,   fever, chills, sweats, unintended wt loss or wt gain, classically pleuritic or exertional cp,  orthopnea pnd or arm/hand swelling  or leg swelling, presyncope, palpitations, abdominal pain,  anorexia, nausea, vomiting, diarrhea  or change in bowel habits or change in bladder habits, change in stools or change in urine, dysuria, hematuria,  rash, arthralgias, visual complaints, headache, numbness, weakness or ataxia or problems with walking or coordination,  change in mood or  memory.          Past Medical History:  Diagnosis Date   Allergy    Anemia    Anorexia nervosa with bulimia 2004   Arthritis    ra   Gastroparesis    GERD (gastroesophageal reflux disease)    History of kidney stones    Lupus (HCC)    Menopause    Pain and swelling of right forearm    resolved   POTS (postural orthostatic tachycardia syndrome)    ?pots per 08-02-2018 cardiology note folllow up prn   Skin abnormalities 07/07/2019   right leg above knee pinhole area had fro months scabs over, will bleed if scraped   SLE (systemic lupus erythematosus) ([place]) 08/29/2016    Outpatient Medications Prior to Visit  Medication Sig Dispense Refill   amoxicillin-clavulanate (AUGMENTIN) 875-125 MG tablet Take 1 tablet by mouth 2 (two) times daily. For 7 days     anastrozole (ARIMIDEX) 1 MG tablet Take 1 tablet (1 mg total) by mouth daily. (Patient taking differently: Take 1 mg by mouth at bedtime.) 90 tablet 3   Ascorbic Acid (VITAMIN C PO) Take 1 tablet by mouth daily.     cetirizine-pseudoephedrine (  ZYRTEC-D) 5-120 MG tablet Take 1 tablet by mouth daily.     Cholecalciferol (DIALYVITE VITAMIN D 5000) 125 MCG (5000 UT) capsule Take 5,000 Units by mouth daily.     dexamethasone (DECADRON) 4 MG tablet Take 4 mg by mouth 2 (two) times daily with a meal. For 7 days     diazepam (VALIUM) 10 MG tablet Take 1 tablet 20 minutes before MRI, and then additional tablet at start of MRI. 4 tablet 0   esomeprazole (NEXIUM) 40 MG capsule Take 40 mg by mouth daily.     Fluticasone Propionate (FLONASE ALLERGY RELIEF NA) Place 1 spray into the nose daily as needed (allergies).     hydroxychloroquine (PLAQUENIL) 200 MG tablet  TAKE 1 TABLET EACH MORNING AND 1/2 TABLET AT BEDTIME 135 tablet 0   Ketotifen Fumarate (ALLERGY EYE DROPS OP) Place 1 drop into both eyes daily as needed (allergies).     Multiple Vitamins-Minerals (MULTIVITAMIN ADULT PO) Take 2 tablets by mouth daily.     polyethylene glycol (MIRALAX / GLYCOLAX) packet Take 34-51 g by mouth at bedtime.     promethazine (PHENERGAN) 12.5 MG tablet Take 12.5 mg by mouth every 6 (six) hours as needed for nausea/vomiting.     vitamin B-12 (CYANOCOBALAMIN) 1000 MCG tablet Take 1,000 mcg by mouth daily.     No facility-administered medications prior to visit.     Objective:     BP 122/78 (BP Location: Left Arm, Patient Position: Sitting, Cuff Size: Normal)   Pulse 90   Temp 98.1 F (36.7 C) (Oral)   Ht '5\' 1"'$  (1.549 m)   Wt 155 lb (70.3 kg)   SpO2 100%   BMI 29.29 kg/m   SpO2: 100 %  Amb very hoarse wf nad    HEENT : Oropharynx  clear        NECK :  without  apparent JVD/ palpable Nodes/TM    LUNGS: no acc muscle use,  Nl contour chest which is clear to A and P bilaterally without cough on insp or exp maneuvers   CV:  RRR  no s3 or murmur or increase in P2, and no edema   ABD:  soft and nontender with nl inspiratory excursion in the supine position. No bruits or organomegaly appreciated   MS:  Nl gait/ ext warm without deformities Or obvious joint restrictions  calf tenderness, cyanosis or clubbing    SKIN: warm and dry without lesions    NEURO:  alert, approp, nl sensorium with  no motor or cerebellar deficits apparent.    CXR PA and Lateral:   04/28/2022 :    I personally reviewed images and impression is as follows:    Slt reduced lung volumes/ no acute dz     Assessment   Upper airway cough syndrome Reccurrent since summer  2022/ never smoker  - eval for flare since 04/18/22 which occurred off zyrtec D and ppi rx  Of the three most common causes of  Sub-acute / recurrent or chronic cough, only one (GERD)  can actually contribute  to/ trigger  the other two (asthma and post nasal drip syndrome)  and perpetuate the cylce of cough.  While not intuitively obvious, many patients with chronic low grade reflux do not cough until there is a primary insult that disturbs the protective epithelial barrier and exposes sensitive nerve endings.   This is typically viral but can due to PNDS and  either may apply here.     >>> The point  is that once this occurs, it is difficult to eliminate the cycle  using anything but a maximally effective acid suppression regimen at least in the short run, accompanied by an appropriate diet to address non acid GERD and control / eliminate the cough itself for at least 3 days with tylenol #3  and >>> also so added depomedrol 120 mg IM today  in case of component of Th-2 driven upper or lower airways inflammation (if cough responds short term only to relapse before return while will on full rx for uacs (as above), then  that would point to allergic rhinitis/ asthma or eos bronchitis as alternative dxs)   Can use saba for sob that persists after first addressing the cough maximally as above but I see no evidence of asthma on today's eval and spent time explain to her how/ when saba might be used as rescue rx.  Rec f/u w/in a week if not better and with Dr Halford Chessman as scheduled         Each maintenance medication was reviewed in detail including emphasizing most importantly the difference between maintenance and prns and under what circumstances the prns are to be triggered using an action plan format where appropriate.  Total time for H and P, chart review, counseling, reviewing hfa device(s) and generating customized AVS unique to this office visit / same day charting > 40 min for acute visit with pt new to me with refractory symptoms of unknown origin           Christinia Gully, MD 04/28/2022

## 2022-04-29 ENCOUNTER — Encounter: Payer: Self-pay | Admitting: Internal Medicine

## 2022-04-29 NOTE — Assessment & Plan Note (Signed)
Reccurrent since summer  2022/ never smoker  - eval for flare since 04/18/22 which occurred off zyrtec D and ppi rx  Of the three most common causes of  Sub-acute / recurrent or chronic cough, only one (GERD)  can actually contribute to/ trigger  the other two (asthma and post nasal drip syndrome)  and perpetuate the cylce of cough.  While not intuitively obvious, many patients with chronic low grade reflux do not cough until there is a primary insult that disturbs the protective epithelial barrier and exposes sensitive nerve endings.   This is typically viral but can due to PNDS and  either may apply here.     >>> The point is that once this occurs, it is difficult to eliminate the cycle  using anything but a maximally effective acid suppression regimen at least in the short run, accompanied by an appropriate diet to address non acid GERD and control / eliminate the cough itself for at least 3 days with tylenol #3  and >>> also so added depomedrol 120 mg IM today  in case of component of Th-2 driven upper or lower airways inflammation (if cough responds short term only to relapse before return while will on full rx for uacs (as above), then  that would point to allergic rhinitis/ asthma or eos bronchitis as alternative dxs)   Can use saba for sob that persists after first addressing the cough maximally as above but I see no evidence of asthma on today's eval and spent time explain to her how/ when saba might be used as rescue rx.  Rec f/u w/in a week if not better and with Dr Halford Chessman as scheduled         Each maintenance medication was reviewed in detail including emphasizing most importantly the difference between maintenance and prns and under what circumstances the prns are to be triggered using an action plan format where appropriate.  Total time for H and P, chart review, counseling, reviewing hfa device(s) and generating customized AVS unique to this office visit / same day charting > 40 min for  acute visit with pt new to me with refractory symptoms of unknown origin

## 2022-04-30 ENCOUNTER — Telehealth: Payer: Self-pay

## 2022-04-30 NOTE — Telephone Encounter (Signed)
Patient is returning phone call. Patient phone number is [phone].

## 2022-04-30 NOTE — Telephone Encounter (Signed)
LMOM for Pt to return call for results of chest xray.

## 2022-04-30 NOTE — Telephone Encounter (Signed)
-----   Message from Tanda Rockers, MD sent at 04/29/2022 12:40 PM EST ----- Call pt:  Reviewed cxr and no acute change so no change in recommendations made at [facility]

## 2022-05-01 NOTE — Telephone Encounter (Signed)
Called patient this morning and went over chest xray results. She verbalized understanding. Nothing further needed

## 2022-05-08 ENCOUNTER — Inpatient Hospital Stay: Payer: BC Managed Care – PPO | Admitting: Hematology and Oncology

## 2022-05-16 ENCOUNTER — Ambulatory Visit
Admission: RE | Admit: 2022-05-16 | Discharge: 2022-05-16 | Disposition: A | Discharge status: Home / Self Care | Payer: BC Managed Care – PPO | Source: Ambulatory Visit | Attending: Hematology and Oncology | Admitting: Hematology and Oncology

## 2022-05-16 DIAGNOSIS — Z1239 Encounter for other screening for malignant neoplasm of breast: Secondary | ICD-10-CM

## 2022-05-20 ENCOUNTER — Ambulatory Visit (HOSPITAL_BASED_OUTPATIENT_CLINIC_OR_DEPARTMENT_OTHER): Payer: BC Managed Care – PPO | Admitting: Pulmonary Disease

## 2022-06-05 ENCOUNTER — Ambulatory Visit: Payer: BC Managed Care – PPO

## 2022-07-07 ENCOUNTER — Inpatient Hospital Stay: Payer: BC Managed Care – PPO | Attending: Hematology and Oncology | Admitting: Hematology and Oncology

## 2022-11-05 ENCOUNTER — Other Ambulatory Visit: Payer: Self-pay | Admitting: *Deleted

## 2022-11-05 DIAGNOSIS — Z79899 Other long term (current) drug therapy: Secondary | ICD-10-CM

## 2022-11-05 DIAGNOSIS — Z1239 Encounter for other screening for malignant neoplasm of breast: Secondary | ICD-10-CM

## 2022-11-05 DIAGNOSIS — Z1589 Genetic susceptibility to other disease: Secondary | ICD-10-CM

## 2022-11-05 DIAGNOSIS — Z1509 Genetic susceptibility to other malignant neoplasm: Secondary | ICD-10-CM

## 2022-11-05 MED ORDER — ANASTROZOLE 1 MG PO TABS: 1.0000 mg | ORAL_TABLET | Freq: Every day | ORAL | 3 refills | Status: DC

## 2022-11-05 NOTE — Telephone Encounter (Signed)
This RN spoke with pt per her call regarding order for MRI.  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  This RN informed order would be placed per above (verified prior orders as noted under sedation).  Debbie [name] states she has been very overwhelmed by work and college that she has "let a lot of things go and I am out of the anastrozole- do I need to wait to be seen to get refills "  She states she knows that she has been off " at least a month if not a little more".  This RN informed her refill will be sent today- verified pharmacy.  Order placed for MRI of breast with sedation.  Request sent for follow up in post MRI.  No further needs at this time.

## 2022-11-06 ENCOUNTER — Telehealth: Payer: Self-pay | Admitting: Hematology and Oncology

## 2022-11-06 NOTE — Telephone Encounter (Signed)
Spoke with patient confirming upcoming appointment  

## 2022-12-10 ENCOUNTER — Inpatient Hospital Stay: Payer: BC Managed Care – PPO | Admitting: Hematology and Oncology

## 2023-02-12 ENCOUNTER — Telehealth: Payer: Self-pay | Admitting: *Deleted

## 2023-02-19 ENCOUNTER — Other Ambulatory Visit: Payer: Self-pay | Admitting: *Deleted

## 2023-02-19 ENCOUNTER — Other Ambulatory Visit: Payer: BC Managed Care – PPO

## 2023-02-19 ENCOUNTER — Ambulatory Visit: Payer: BC Managed Care – PPO | Admitting: Hematology and Oncology

## 2023-02-19 DIAGNOSIS — Z1239 Encounter for other screening for malignant neoplasm of breast: Secondary | ICD-10-CM

## 2023-02-19 DIAGNOSIS — Z1501 Genetic susceptibility to malignant neoplasm of breast: Secondary | ICD-10-CM

## 2023-02-19 DIAGNOSIS — Z1589 Genetic susceptibility to other disease: Secondary | ICD-10-CM

## 2023-02-20 ENCOUNTER — Ambulatory Visit: Payer: BC Managed Care – PPO | Admitting: Hematology and Oncology

## 2023-02-20 ENCOUNTER — Inpatient Hospital Stay: Payer: BC Managed Care – PPO | Attending: Hematology and Oncology

## 2023-02-20 NOTE — Progress Notes (Deleted)
No show letter will be sent

## 2023-02-26 ENCOUNTER — Ambulatory Visit (HOSPITAL_COMMUNITY): Admission: RE | Admit: 2023-02-26 | Payer: BC Managed Care – PPO | Source: Ambulatory Visit

## 2023-03-03 ENCOUNTER — Inpatient Hospital Stay: Payer: BC Managed Care – PPO | Admitting: Hematology and Oncology

## 2023-03-05 NOTE — Telephone Encounter (Signed)
Error. Nam Vossler M Aleka Twitty, RN  

## 2023-05-18 ENCOUNTER — Inpatient Hospital Stay: Payer: BC Managed Care – PPO

## 2023-05-18 ENCOUNTER — Inpatient Hospital Stay: Payer: BC Managed Care – PPO | Attending: Hematology and Oncology | Admitting: Hematology and Oncology

## 2023-05-28 ENCOUNTER — Ambulatory Visit (HOSPITAL_COMMUNITY): Payer: BC Managed Care – PPO

## 2023-08-17 ENCOUNTER — Inpatient Hospital Stay: Admitting: Hematology and Oncology

## 2023-08-17 ENCOUNTER — Inpatient Hospital Stay

## 2023-09-03 ENCOUNTER — Telehealth: Payer: Self-pay

## 2023-09-03 NOTE — Telephone Encounter (Signed)
 Spoke with patient and confirmed appointment on 4/18

## 2023-09-04 ENCOUNTER — Inpatient Hospital Stay: Admitting: Hematology and Oncology

## 2023-09-04 ENCOUNTER — Inpatient Hospital Stay: Attending: Hematology and Oncology

## 2023-09-04 VITALS — BP 138/68 | HR 76 | Temp 97.9°F | Resp 16 | Wt 159.2 lb

## 2023-09-04 DIAGNOSIS — Z1239 Encounter for other screening for malignant neoplasm of breast: Secondary | ICD-10-CM | POA: Diagnosis not present

## 2023-09-04 DIAGNOSIS — Z1509 Genetic susceptibility to other malignant neoplasm: Secondary | ICD-10-CM

## 2023-09-04 DIAGNOSIS — L989 Disorder of the skin and subcutaneous tissue, unspecified: Secondary | ICD-10-CM | POA: Insufficient documentation

## 2023-09-04 DIAGNOSIS — Z800 Family history of malignant neoplasm of digestive organs: Secondary | ICD-10-CM | POA: Insufficient documentation

## 2023-09-04 DIAGNOSIS — D242 Benign neoplasm of left breast: Secondary | ICD-10-CM | POA: Insufficient documentation

## 2023-09-04 DIAGNOSIS — Z1501 Genetic susceptibility to malignant neoplasm of breast: Secondary | ICD-10-CM | POA: Diagnosis not present

## 2023-09-04 DIAGNOSIS — Z79811 Long term (current) use of aromatase inhibitors: Secondary | ICD-10-CM | POA: Diagnosis not present

## 2023-09-04 DIAGNOSIS — Z90722 Acquired absence of ovaries, bilateral: Secondary | ICD-10-CM | POA: Diagnosis not present

## 2023-09-04 DIAGNOSIS — G90A Postural orthostatic tachycardia syndrome (POTS): Secondary | ICD-10-CM | POA: Diagnosis not present

## 2023-09-04 DIAGNOSIS — Z801 Family history of malignant neoplasm of trachea, bronchus and lung: Secondary | ICD-10-CM | POA: Insufficient documentation

## 2023-09-04 DIAGNOSIS — M069 Rheumatoid arthritis, unspecified: Secondary | ICD-10-CM | POA: Insufficient documentation

## 2023-09-04 DIAGNOSIS — Z1589 Genetic susceptibility to other disease: Secondary | ICD-10-CM

## 2023-09-04 DIAGNOSIS — Z79899 Other long term (current) drug therapy: Secondary | ICD-10-CM | POA: Diagnosis not present

## 2023-09-04 LAB — CBC WITH DIFFERENTIAL (CANCER CENTER ONLY)
Abs Immature Granulocytes: 0.01 10*3/uL (ref 0.00–0.07)
Basophils Absolute: 0 10*3/uL (ref 0.0–0.1)
Basophils Relative: 1 %
Eosinophils Absolute: 0.2 10*3/uL (ref 0.0–0.5)
Eosinophils Relative: 3 %
HCT: 37.9 % (ref 36.0–46.0)
Hemoglobin: 13 g/dL (ref 12.0–15.0)
Immature Granulocytes: 0 %
Lymphocytes Relative: 37 %
Lymphs Abs: 2.2 10*3/uL (ref 0.7–4.0)
MCH: 28.1 pg (ref 26.0–34.0)
MCHC: 34.3 g/dL (ref 30.0–36.0)
MCV: 82 fL (ref 80.0–100.0)
Monocytes Absolute: 0.5 10*3/uL (ref 0.1–1.0)
Monocytes Relative: 9 %
Neutro Abs: 3 10*3/uL (ref 1.7–7.7)
Neutrophils Relative %: 50 %
Platelet Count: 309 10*3/uL (ref 150–400)
RBC: 4.62 MIL/uL (ref 3.87–5.11)
RDW: 12.7 % (ref 11.5–15.5)
WBC Count: 5.9 10*3/uL (ref 4.0–10.5)
nRBC: 0 % (ref 0.0–0.2)

## 2023-09-04 LAB — CMP (CANCER CENTER ONLY)
ALT: 42 U/L (ref 0–44)
AST: 30 U/L (ref 15–41)
Albumin: 4.3 g/dL (ref 3.5–5.0)
Alkaline Phosphatase: 118 U/L (ref 38–126)
Anion gap: 4 — ABNORMAL LOW (ref 5–15)
BUN: 16 mg/dL (ref 6–20)
CO2: 28 mmol/L (ref 22–32)
Calcium: 9.6 mg/dL (ref 8.9–10.3)
Chloride: 108 mmol/L (ref 98–111)
Creatinine: 0.66 mg/dL (ref 0.44–1.00)
GFR, Estimated: >60 mL/min (ref >60)
Glucose, Bld: 88 mg/dL (ref 70–99)
Potassium: 4.3 mmol/L (ref 3.5–5.1)
Sodium: 140 mmol/L (ref 135–145)
Total Bilirubin: 0.3 mg/dL (ref 0.0–1.2)
Total Protein: 7.4 g/dL (ref 6.5–8.1)

## 2023-09-04 NOTE — Progress Notes (Signed)
 [facility]  [phone] [phone]     ID: KAYCE CHISMAR DOB: [birth year]  MR#: [id number]  RDW#:[id number]  Patient Care Team: Fernand Tracey LABOR, MD as PCP - General (Family Medicine) Loni Soyla LABOR, MD as Consulting Physician (Cardiology) Tat, Asberry RAMAN, DO as Consulting Physician (Neurology) Johnnye Ade, MD as Consulting Physician (Obstetrics and Gynecology) Diona Charnley, MD as Resident (Neurology) Vanderbilt Ned, MD as Consulting Physician (General Surgery) Shellia Oh, MD (Inactive) as Consulting Physician (Pulmonary Disease) Crawford Morna Pickle, NP as Nurse Practitioner (Hematology and Oncology) Amber Stalls, MD OTHER MD:  CHIEF COMPLAINT: 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  History of Present Illness Debbie [name] is a 53 year old female with rheumatoid arthritis and breast cancer who presents for follow-up regarding her breast cancer surveillance and rheumatoid arthritis management.  She is currently on anastrozole , initiated in November 2021, and has been undergoing regular MRIs and mammograms. However, she missed her mammogram in 2024 due to personal circumstances, including a family tragedy and returning to school.  She experiences significant inflammation, attributed to her rheumatoid arthritis and stress. She underwent a couple of rounds of steroids from January to March 2025 to manage her symptoms. She describes episodes of waking up with severe pain in her arms, which have improved but still experiences mild inflammation. She has been on Plaquenil  for many years for her rheumatoid arthritis and has tolerated it well.  She reports symptoms related to her postural orthostatic tachycardia syndrome, including  tremors in her right hand, difficulty talking, and a sensation of the world floating. Her blood pressure was recorded at 158/102 during one episode. She has been consuming more caffeine since returning to school and is now trying to reduce her intake.  She has a family history of pancreatic cancer in her grandfather, who was an extreme alcoholic. She has not completed the recommended MRCP for pancreatic cancer screening due to life circumstances and the requirement for multiple MRIs.  She has noticed skin changes on her stomach, which she describes as possibly worsening. She has a history of tanning but has not done so in the past year. She is vigilant about skin checks due to her past tanning habits.  Rest of the pertinent 10 point ROS reviewed and neg.   PAST MEDICAL HISTORY: Past Medical History:  Diagnosis Date   Allergy    Anemia    Anorexia nervosa with bulimia 2004   Arthritis    ra   Gastroparesis    GERD (gastroesophageal reflux disease)    History of kidney stones    Lupus (HCC)    Menopause    Pain and swelling of right forearm    resolved   POTS (postural orthostatic tachycardia syndrome)    ?pots per 08-02-2018 cardiology note folllow up prn   Skin abnormalities 07/07/2019   right leg above knee pinhole area had fro months scabs over, will bleed if scraped   SLE (systemic lupus erythematosus) (HCC) 08/29/2016     PAST SURGICAL HISTORY: Reviewed   FAMILY HISTORY: Family History  Problem Relation Age of Onset   Arthritis Mother        rheumatoid    Hernia Mother    Hypertension Mother    Heart attack Mother    Diabetes Father    Lupus  Sister    Rheum arthritis Daughter    Hashimoto's thyroiditis Daughter    Diabetes Maternal Grandmother    Cancer Maternal Grandfather    Pancreatic cancer Maternal Grandfather 29   Lung cancer Maternal Grandfather 3   Colon cancer Maternal Grandfather 21       colorectal   Heart disease Paternal Grandmother    Diabetes  Paternal Grandfather    Heart disease Paternal Grandfather    Diabetes Other    Breast cancer Neg Hx   [name] died at age 67 from complications of diabetes.  His parents had no history of cancer.  [name] had 2 sisters and 3 brothers, with no cancer in any of those siblings.  The patient's mother is 60 years old as of December 2020.  Her father had metastatic cancer but the patient does not know what the primary was.  The maternal grandmother did not have cancer and neither did this single maternal aunt.   GYNECOLOGIC HISTORY:  No LMP recorded. Patient has had a hysterectomy. Menarche: 53 years old Age at first live birth: 53 years old GX P 2 LMP age 75 (with hysterectomy HRT estrogen alone Hysterectomy? Yes, at age 66 BSO? 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  Her husband Beverley works for research officer, political party.  There is son Venetia, 62 as of December 2020 lives in [place] and works librarian, academic towers.  Daughter Alan, 21, is a Dietitian.  The patient has no grandchildren.  She attends a [employer]   ADVANCED DIRECTIVES: In the absence of any documents to the contrary the patient's husband is her healthcare power of attorney   HEALTH MAINTENANCE: Social History   Tobacco Use   Smoking status: Never   Smokeless tobacco: Never  Vaping Use   Vaping status: Never Used  Substance Use Topics   Alcohol use: No    Comment: rarely    Drug use: No     Colonoscopy: 2016 (Dr. Celestia)  PAP: 01/2016, negative  Bone density: Never   Allergies  Allergen Reactions   Zofran  [Ondansetron  Hcl] Other (See Comments)    Stutters and looses control of her arm and legs   Reglan  [Metoclopramide ]     stutters and loses control of arms of legs    Current Outpatient Medications  Medication Sig Dispense Refill   amoxicillin-clavulanate (AUGMENTIN) 875-125 MG tablet Take 1 tablet  by mouth 2 (two) times daily. For 7 days     anastrozole  (ARIMIDEX ) 1 MG tablet Take 1 tablet (1 mg total) by mouth daily. 90 tablet 3   Ascorbic Acid (VITAMIN C PO) Take 1 tablet by mouth daily.     cetirizine-pseudoephedrine (ZYRTEC-D) 5-120 MG tablet Take 1 tablet by mouth daily.     Cholecalciferol (DIALYVITE VITAMIN D  5000) 125 MCG (5000 UT) capsule Take 5,000 Units by mouth daily.     dexamethasone  (DECADRON ) 4 MG tablet Take 4 mg by mouth 2 (two) times daily with a meal. For 7 days     diazepam  (VALIUM ) 10 MG tablet Take 1 tablet 20 minutes before MRI, and then additional tablet at start of MRI. 4 tablet 0   esomeprazole (NEXIUM) 40 MG capsule Take 40 mg by mouth daily.     Fluticasone Propionate (FLONASE ALLERGY RELIEF NA) Place 1 spray into the nose daily as needed (allergies).     hydroxychloroquine  (PLAQUENIL ) 200 MG tablet TAKE 1 TABLET  EACH MORNING AND 1/2 TABLET AT BEDTIME 135 tablet 0   Ketotifen Fumarate (ALLERGY EYE DROPS OP) Place 1 drop into both eyes daily as needed (allergies).     Multiple Vitamins-Minerals (MULTIVITAMIN ADULT PO) Take 2 tablets by mouth daily.     polyethylene glycol (MIRALAX / GLYCOLAX) packet Take 34-51 g by mouth at bedtime.     promethazine  (PHENERGAN ) 12.5 MG tablet Take 12.5 mg by mouth every 6 (six) hours as needed for nausea/vomiting.     vitamin B-12 (CYANOCOBALAMIN) 1000 MCG tablet Take 1,000 mcg by mouth daily.     No current facility-administered medications for this visit.    OBJECTIVE:   Vitals:   09/04/23 1133  BP: 138/68  Pulse: 76  Resp: 16  Temp: 97.9 F (36.6 C)  SpO2: 100%     Body mass index is 30.08 kg/m.   Wt Readings from Last 3 Encounters:  09/04/23 159 lb 3.2 oz (72.2 kg)  04/28/22 155 lb (70.3 kg)  01/27/22 139 lb (63 kg)    PE deferred in lieu of counseling.   LAB RESULTS: None today  ELIGIBLE FOR AVAILABLE RESEARCH PROTOCOL: no  ASSESSMENT: 53 y.o. 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 - Contact radiology to confirm if MRI can proceed without a recent mammogram. - Order mammogram if required by radiology. - Continue anastrozole  until November 2026.  Pancreatic cancer screening Family history of pancreatic cancer (grandfather). Emphasized importance of screening due to the deadly nature of pancreatic cancer, despite lower risk as the affected relative is not a first-degree relative. - Advise contacting her GI specialist in [facility] for pancreatic cancer screening. - Offer referral to a local GI specialist if needed. She was instructed to keep us  posted if a referral is needed.  Rheumatoid arthritis Experiences inflammation and pain, exacerbated by stress and lack of sleep. Plaquenil  has been effective. - Continue Plaquenil  for rheumatoid arthritis management.  Postural Orthostatic Tachycardia Syndrome (POTS) Reports symptoms consistent with POTS, exacerbated by caffeine intake.  Skin lesions Assessed as likely benign actinic keratosis. Advised monitoring for changes. - Monitor skin lesions for changes in size, shape, or bleeding. - Consult dermatologist if lesions change or grow.  Total time spent: 30 minutes.  *Total Encounter Time as defined by the Centers for Medicare and Medicaid Services includes, in addition to the face-to-face time of a patient visit (documented  in the note above) non-face-to-face time: obtaining and reviewing outside history, ordering and reviewing medications, tests or procedures, care coordination (communications with other health care professionals or caregivers) and documentation in the medical record.

## 2023-09-04 NOTE — Addendum Note (Signed)
 Addended by: Taylon Louison, NAGA VENKATA KALIPRAVEENA on: 09/04/2023 02:04 PM   Modules accepted: Orders

## 2023-09-07 ENCOUNTER — Telehealth: Payer: Self-pay | Admitting: Hematology and Oncology

## 2023-09-07 NOTE — Telephone Encounter (Signed)
 Spoke with pt about scheduled year appt date and time. Pt stated she will; have to call back and confirm

## 2023-09-16 ENCOUNTER — Other Ambulatory Visit: Payer: Self-pay

## 2023-09-16 ENCOUNTER — Telehealth: Payer: Self-pay | Admitting: *Deleted

## 2023-09-16 ENCOUNTER — Encounter (HOSPITAL_COMMUNITY): Payer: Self-pay | Admitting: *Deleted

## 2023-09-16 ENCOUNTER — Ambulatory Visit

## 2023-09-16 NOTE — Telephone Encounter (Signed)
 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  Kerney Pee feels above plan is more feasible for her to complete at this time.  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  This RN called MRI per above and was informed appt has to be cancelled by [facility].  This RN contacted [facility] - spoke with Arnetta Lank who canceled the appt.  This RN will follow up with needed mammogram

## 2023-09-16 NOTE — Progress Notes (Signed)
 PCP - Dr Trina Fujita Cardiologist - Dr Grady Lawman  Chest x-ray - n/a EKG - DOS Stress Test - n/a ECHO - 07/21/18 Cardiac Cath - n/a  ICD Pacemaker/Loop - n/a  Sleep Study -  n/a  Diabetes - n/a  Hold Plaquenil  for 3 days prior to procedure.  Last dose was on morning of 09/16/23 (SDW Call).  Aspirin  & Blood Thinner Instructions:  n/a  NPO  Anesthesia review: Yes  STOP now taking any Aspirin  (unless otherwise instructed by your surgeon), Aleve , Naproxen , Ibuprofen , Motrin , Advil , Goody's, BC's, all herbal medications, fish oil, and all vitamins.   Coronavirus Screening Do you have any of the following symptoms:  Cough yes/no: No Fever (>100.61F)  yes/no: No Runny nose yes/no: No Sore throat yes/no: No Difficulty breathing/shortness of breath  yes/no: No  Have you traveled in the last 14 days and where? yes/no: No  Patient verbalized understanding of instructions that were given via phone.

## 2023-09-17 ENCOUNTER — Ambulatory Visit (HOSPITAL_COMMUNITY): Admission: RE | Admit: 2023-09-17 | Payer: BC Managed Care – PPO | Source: Ambulatory Visit

## 2023-09-17 ENCOUNTER — Encounter: Admission: RE | Payer: Self-pay | Source: Home / Self Care

## 2023-09-17 ENCOUNTER — Other Ambulatory Visit: Payer: Self-pay | Admitting: *Deleted

## 2023-09-17 ENCOUNTER — Ambulatory Visit (HOSPITAL_COMMUNITY): Admission: RE | Admit: 2023-09-17 | Payer: BC Managed Care – PPO | Source: Home / Self Care

## 2023-09-17 HISTORY — DX: Prediabetes: R73.03

## 2023-09-17 HISTORY — DX: Fatty (change of) liver, not elsewhere classified: K76.0

## 2023-09-17 HISTORY — DX: Pneumonia, unspecified organism: J18.9

## 2023-09-17 SURGERY — MRI WITH ANESTHESIA
Anesthesia: General

## 2023-10-08 NOTE — Telephone Encounter (Signed)
 No entry

## 2023-11-06 ENCOUNTER — Ambulatory Visit
Admission: RE | Admit: 2023-11-06 | Discharge: 2023-11-06 | Disposition: A | Discharge status: Home / Self Care | Source: Ambulatory Visit | Attending: Hematology and Oncology | Admitting: Hematology and Oncology

## 2023-11-06 DIAGNOSIS — Z1509 Genetic susceptibility to other malignant neoplasm: Secondary | ICD-10-CM

## 2023-11-06 DIAGNOSIS — Z1501 Genetic susceptibility to malignant neoplasm of breast: Secondary | ICD-10-CM

## 2023-11-06 DIAGNOSIS — Z1239 Encounter for other screening for malignant neoplasm of breast: Secondary | ICD-10-CM

## 2024-01-29 ENCOUNTER — Other Ambulatory Visit: Payer: Self-pay | Admitting: *Deleted

## 2024-01-29 MED ORDER — ANASTROZOLE 1 MG PO TABS: 1.0000 mg | ORAL_TABLET | Freq: Every day | ORAL | 4 refills | Status: AC

## 2024-09-06 ENCOUNTER — Ambulatory Visit: Admitting: Hematology and Oncology
